# Patient Record
Sex: Male | Born: 1935 | Race: White | Hispanic: No | Smoking: Former smoker
Health system: Southern US, Community
[De-identification: ages and names within clinical notes are randomized; demographics above are authoritative.]

## PROBLEM LIST (undated history)

## (undated) DIAGNOSIS — R251 Tremor, unspecified: Secondary | ICD-10-CM

## (undated) DIAGNOSIS — D539 Nutritional anemia, unspecified: Secondary | ICD-10-CM

## (undated) DIAGNOSIS — E119 Type 2 diabetes mellitus without complications: Secondary | ICD-10-CM

## (undated) DIAGNOSIS — Z7409 Other reduced mobility: Secondary | ICD-10-CM

## (undated) DIAGNOSIS — Z8711 Personal history of peptic ulcer disease: Secondary | ICD-10-CM

## (undated) DIAGNOSIS — I739 Peripheral vascular disease, unspecified: Secondary | ICD-10-CM

## (undated) DIAGNOSIS — R29898 Other symptoms and signs involving the musculoskeletal system: Secondary | ICD-10-CM

## (undated) DIAGNOSIS — I2699 Other pulmonary embolism without acute cor pulmonale: Secondary | ICD-10-CM

## (undated) DIAGNOSIS — I350 Nonrheumatic aortic (valve) stenosis: Secondary | ICD-10-CM

## (undated) DIAGNOSIS — G20 Parkinson's disease: Secondary | ICD-10-CM

## (undated) DIAGNOSIS — F0280 Dementia in other diseases classified elsewhere without behavioral disturbance: Secondary | ICD-10-CM

## (undated) DIAGNOSIS — J189 Pneumonia, unspecified organism: Secondary | ICD-10-CM

## (undated) DIAGNOSIS — M1990 Unspecified osteoarthritis, unspecified site: Secondary | ICD-10-CM

## (undated) DIAGNOSIS — F0390 Unspecified dementia without behavioral disturbance: Secondary | ICD-10-CM

## (undated) DIAGNOSIS — Z6841 Body Mass Index (BMI) 40.0 and over, adult: Secondary | ICD-10-CM

## (undated) DIAGNOSIS — D72829 Elevated white blood cell count, unspecified: Secondary | ICD-10-CM

## (undated) DIAGNOSIS — R011 Cardiac murmur, unspecified: Secondary | ICD-10-CM

## (undated) DIAGNOSIS — N281 Cyst of kidney, acquired: Secondary | ICD-10-CM

## (undated) DIAGNOSIS — I82409 Acute embolism and thrombosis of unspecified deep veins of unspecified lower extremity: Secondary | ICD-10-CM

## (undated) DIAGNOSIS — I482 Chronic atrial fibrillation, unspecified: Secondary | ICD-10-CM

## (undated) DIAGNOSIS — E039 Hypothyroidism, unspecified: Secondary | ICD-10-CM

## (undated) DIAGNOSIS — I5032 Chronic diastolic (congestive) heart failure: Secondary | ICD-10-CM

## (undated) DIAGNOSIS — E7800 Pure hypercholesterolemia, unspecified: Secondary | ICD-10-CM

## (undated) DIAGNOSIS — G629 Polyneuropathy, unspecified: Secondary | ICD-10-CM

## (undated) DIAGNOSIS — I2510 Atherosclerotic heart disease of native coronary artery without angina pectoris: Secondary | ICD-10-CM

## (undated) DIAGNOSIS — G20A1 Parkinson's disease without dyskinesia, without mention of fluctuations: Secondary | ICD-10-CM

## (undated) DIAGNOSIS — R32 Unspecified urinary incontinence: Secondary | ICD-10-CM

## (undated) DIAGNOSIS — J42 Unspecified chronic bronchitis: Secondary | ICD-10-CM

## (undated) DIAGNOSIS — I209 Angina pectoris, unspecified: Secondary | ICD-10-CM

## (undated) DIAGNOSIS — I10 Essential (primary) hypertension: Secondary | ICD-10-CM

## (undated) DIAGNOSIS — Z8719 Personal history of other diseases of the digestive system: Secondary | ICD-10-CM

## (undated) HISTORY — DX: Unspecified dementia, unspecified severity, without behavioral disturbance, psychotic disturbance, mood disturbance, and anxiety: F03.90

## (undated) HISTORY — DX: Chronic atrial fibrillation, unspecified: I48.20

## (undated) HISTORY — PX: VENA CAVA FILTER PLACEMENT: SUR1032

## (undated) HISTORY — DX: Dementia in other diseases classified elsewhere without behavioral disturbance: F02.80

## (undated) HISTORY — DX: Hypothyroidism, unspecified: E03.9

## (undated) HISTORY — DX: Morbid (severe) obesity due to excess calories: E66.01

## (undated) HISTORY — DX: Polyneuropathy, unspecified: G62.9

## (undated) HISTORY — DX: Parkinson's disease: G20

## (undated) HISTORY — PX: PROSTATE ABLATION: SHX6042

## (undated) HISTORY — DX: Chronic diastolic (congestive) heart failure: I50.32

## (undated) HISTORY — DX: Pure hypercholesterolemia, unspecified: E78.00

## (undated) HISTORY — DX: Cyst of kidney, acquired: N28.1

## (undated) HISTORY — PX: MOLE REMOVAL: SHX2046

## (undated) HISTORY — DX: Elevated white blood cell count, unspecified: D72.829

## (undated) HISTORY — DX: Atherosclerotic heart disease of native coronary artery without angina pectoris: I25.10

## (undated) HISTORY — DX: Other symptoms and signs involving the musculoskeletal system: R29.898

## (undated) HISTORY — DX: Body Mass Index (BMI) 40.0 and over, adult: Z684

## (undated) HISTORY — DX: Essential (primary) hypertension: I10

## (undated) HISTORY — DX: Other pulmonary embolism without acute cor pulmonale: I26.99

## (undated) HISTORY — DX: Unspecified urinary incontinence: R32

## (undated) HISTORY — DX: Nutritional anemia, unspecified: D53.9

## (undated) HISTORY — DX: Other reduced mobility: Z74.09

---

## 2003-03-14 ENCOUNTER — Ambulatory Visit (HOSPITAL_COMMUNITY): Admission: RE | Admit: 2003-03-14 | Discharge: 2003-03-14 | Payer: Self-pay | Admitting: Urology

## 2003-03-14 ENCOUNTER — Encounter (INDEPENDENT_AMBULATORY_CARE_PROVIDER_SITE_OTHER): Payer: Self-pay | Admitting: Specialist

## 2003-03-14 ENCOUNTER — Encounter: Payer: Self-pay | Admitting: Urology

## 2005-03-31 ENCOUNTER — Ambulatory Visit: Payer: Self-pay | Admitting: Cardiology

## 2005-04-15 ENCOUNTER — Ambulatory Visit: Payer: Self-pay

## 2005-04-15 ENCOUNTER — Ambulatory Visit: Payer: Self-pay | Admitting: Cardiology

## 2005-04-21 ENCOUNTER — Ambulatory Visit: Payer: Self-pay

## 2005-05-05 ENCOUNTER — Ambulatory Visit: Payer: Self-pay | Admitting: Cardiology

## 2005-05-11 ENCOUNTER — Ambulatory Visit: Payer: Self-pay | Admitting: Internal Medicine

## 2005-05-11 ENCOUNTER — Ambulatory Visit: Payer: Self-pay | Admitting: Cardiology

## 2005-05-11 ENCOUNTER — Inpatient Hospital Stay (HOSPITAL_BASED_OUTPATIENT_CLINIC_OR_DEPARTMENT_OTHER): Admission: RE | Admit: 2005-05-11 | Discharge: 2005-05-11 | Payer: Self-pay | Admitting: Cardiovascular Disease

## 2005-05-18 ENCOUNTER — Ambulatory Visit: Payer: Self-pay | Admitting: Internal Medicine

## 2005-08-18 ENCOUNTER — Ambulatory Visit: Payer: Self-pay | Admitting: Cardiology

## 2005-08-27 ENCOUNTER — Ambulatory Visit: Payer: Self-pay

## 2005-08-27 ENCOUNTER — Ambulatory Visit: Payer: Self-pay | Admitting: Cardiology

## 2007-02-08 ENCOUNTER — Ambulatory Visit: Payer: Self-pay | Admitting: Cardiology

## 2007-03-03 ENCOUNTER — Ambulatory Visit: Payer: Self-pay | Admitting: Cardiology

## 2007-03-03 ENCOUNTER — Ambulatory Visit: Payer: Self-pay

## 2007-06-23 ENCOUNTER — Ambulatory Visit: Payer: Self-pay | Admitting: Cardiology

## 2007-08-23 ENCOUNTER — Ambulatory Visit: Payer: Self-pay | Admitting: Cardiology

## 2007-08-23 ENCOUNTER — Encounter: Payer: Self-pay | Admitting: Internal Medicine

## 2007-08-23 ENCOUNTER — Ambulatory Visit: Payer: Self-pay | Admitting: Vascular Surgery

## 2007-08-23 ENCOUNTER — Inpatient Hospital Stay (HOSPITAL_COMMUNITY): Admission: EM | Admit: 2007-08-23 | Discharge: 2007-09-05 | Payer: Self-pay | Admitting: Emergency Medicine

## 2007-08-23 ENCOUNTER — Ambulatory Visit: Payer: Self-pay | Admitting: Internal Medicine

## 2007-08-26 ENCOUNTER — Encounter: Payer: Self-pay | Admitting: Internal Medicine

## 2007-08-30 ENCOUNTER — Encounter: Payer: Self-pay | Admitting: Internal Medicine

## 2007-09-01 ENCOUNTER — Ambulatory Visit: Payer: Self-pay | Admitting: Internal Medicine

## 2008-04-30 ENCOUNTER — Ambulatory Visit: Payer: Self-pay | Admitting: Cardiology

## 2008-09-21 ENCOUNTER — Encounter: Payer: Self-pay | Admitting: Cardiology

## 2008-09-21 ENCOUNTER — Ambulatory Visit: Payer: Self-pay | Admitting: Cardiology

## 2008-09-21 ENCOUNTER — Ambulatory Visit: Payer: Self-pay

## 2008-12-20 ENCOUNTER — Ambulatory Visit: Payer: Self-pay | Admitting: Cardiology

## 2008-12-20 DIAGNOSIS — I350 Nonrheumatic aortic (valve) stenosis: Secondary | ICD-10-CM

## 2008-12-20 DIAGNOSIS — E7800 Pure hypercholesterolemia, unspecified: Secondary | ICD-10-CM

## 2008-12-20 DIAGNOSIS — Z86711 Personal history of pulmonary embolism: Secondary | ICD-10-CM | POA: Insufficient documentation

## 2008-12-20 DIAGNOSIS — I10 Essential (primary) hypertension: Secondary | ICD-10-CM

## 2008-12-20 DIAGNOSIS — I4891 Unspecified atrial fibrillation: Secondary | ICD-10-CM

## 2008-12-20 DIAGNOSIS — I82409 Acute embolism and thrombosis of unspecified deep veins of unspecified lower extremity: Secondary | ICD-10-CM | POA: Insufficient documentation

## 2008-12-20 HISTORY — DX: Pure hypercholesterolemia, unspecified: E78.00

## 2009-01-05 IMAGING — CT CT PELVIS W/ CM
2 of 5 series · 16 of 46 positions shown, 18 images · IV contrast (APPLIED)
Comparison: None.

ABDOMEN CT WITH CONTRAST:

CLINICAL DATA: Pulmonary embolism. Chest pain. Pain all over. Question abscess.
TECHNIQUE: Multidetector CT imaging of the abdomen and pelvis was performed
following the standard protocol during bolus administration of intravenous
contrast.

Contrast:  100 cc Omnipaque 300

[Series 2: abd/pelv with 5.0 b31f st · axial · 0.88mm/px · z∈[-538,-68]mm · 13 of 108 slices shown, 15 images]
[im 7/108  soft-tissue]
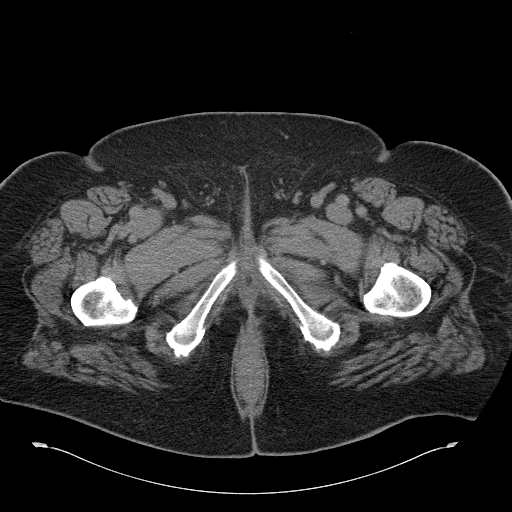
[im 7/108  bone]
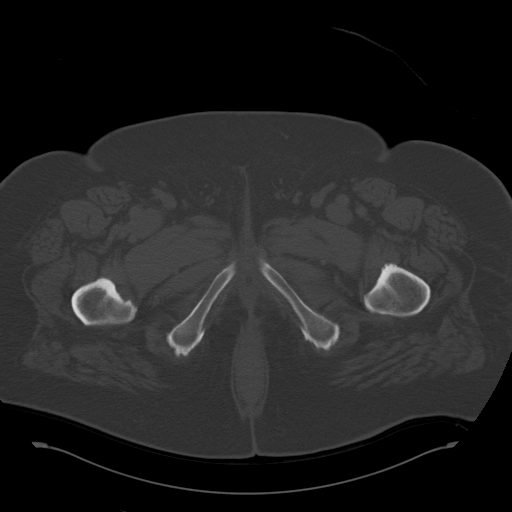
[im 13/108  soft-tissue]
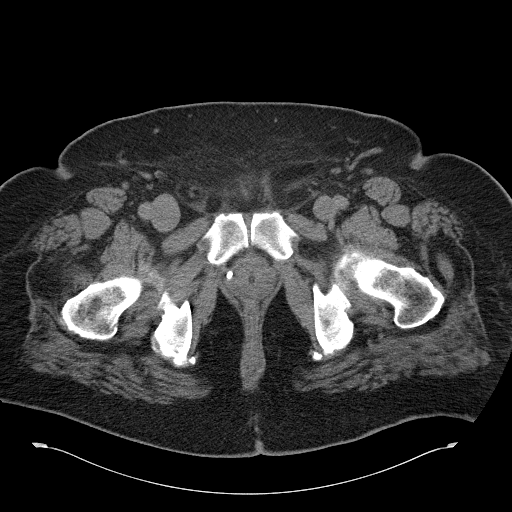
[im 26/108  soft-tissue]
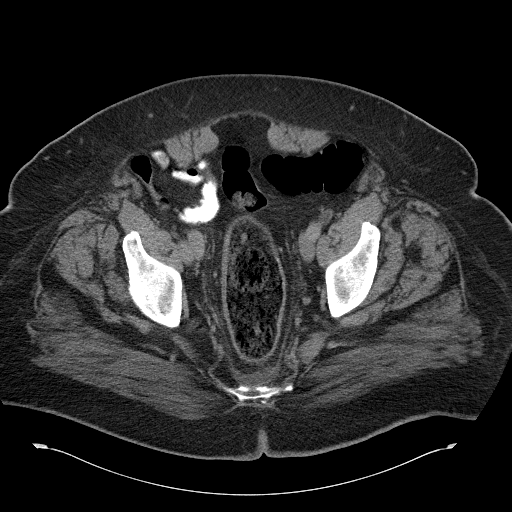
[im 32/108  soft-tissue]
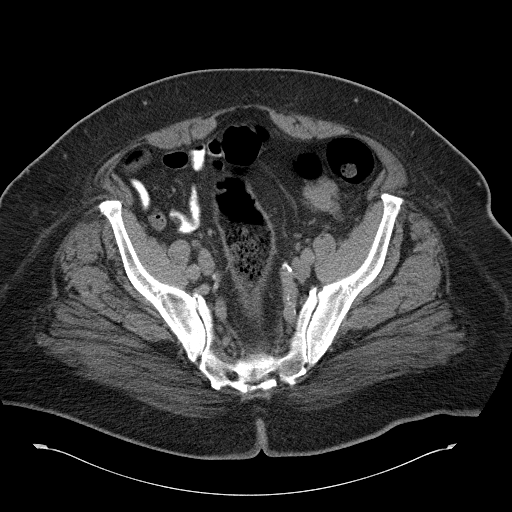
[im 38/108  soft-tissue]
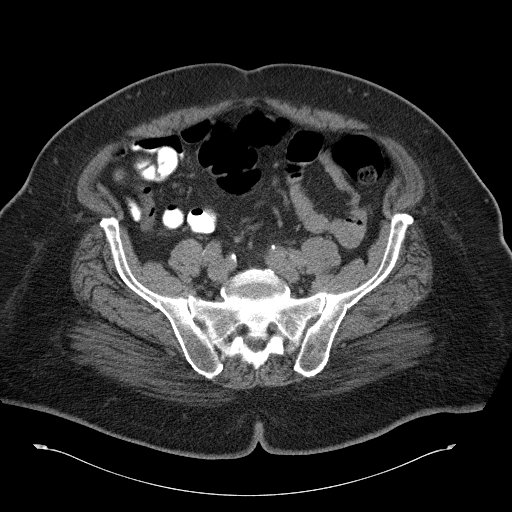
[im 45/108  soft-tissue]
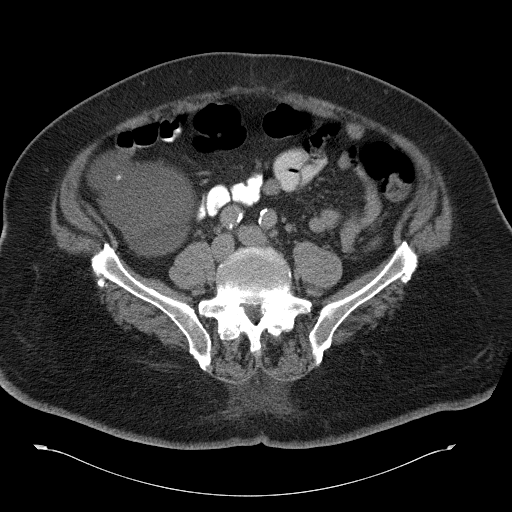
[im 57/108  soft-tissue]
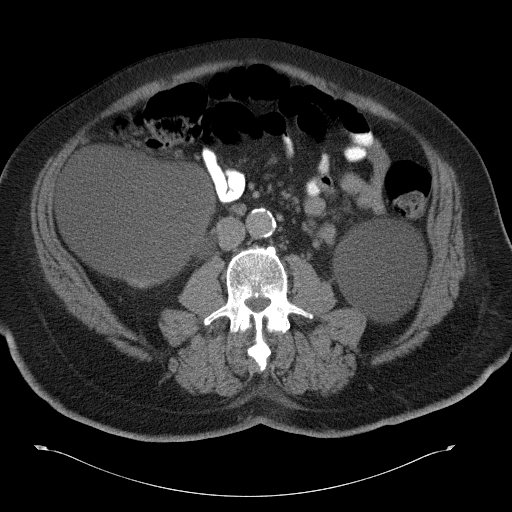
[im 63/108  soft-tissue]
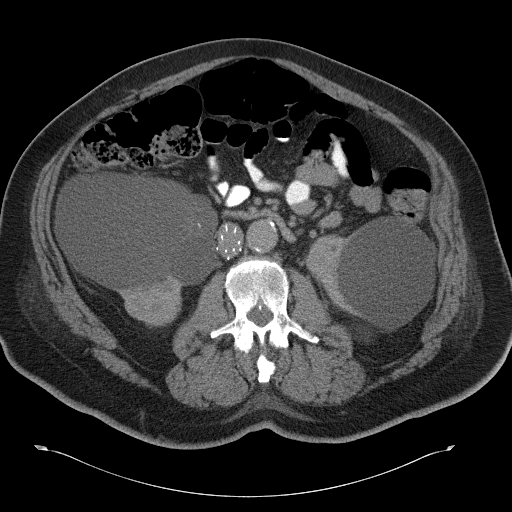
[im 70/108  soft-tissue]
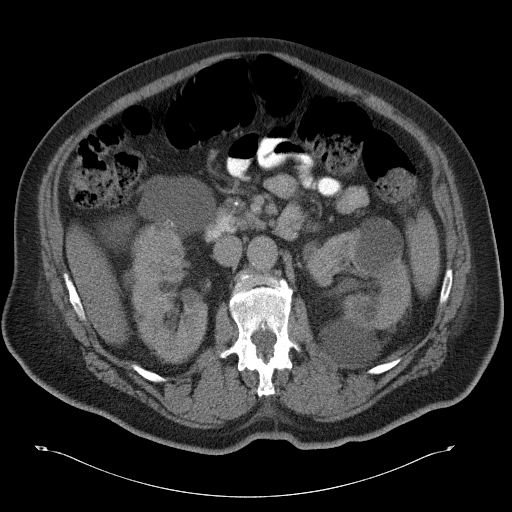
[im 70/108  bone]
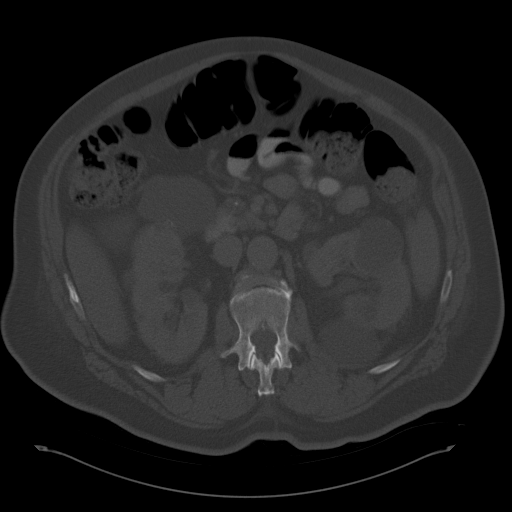
[im 76/108  soft-tissue]
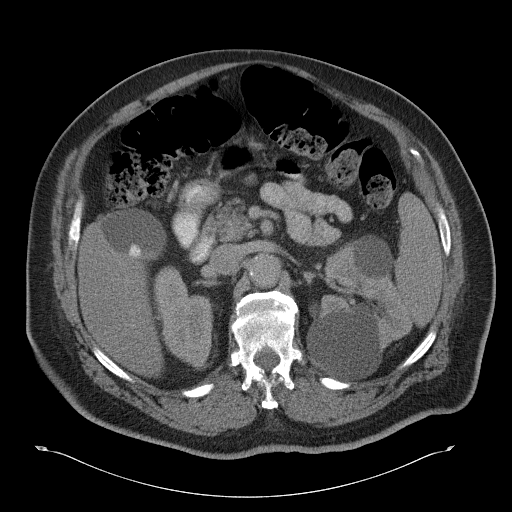
[im 82/108  soft-tissue]
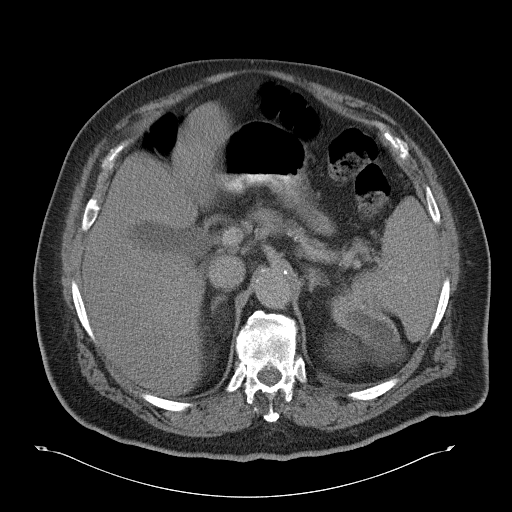
[im 95/108  soft-tissue]
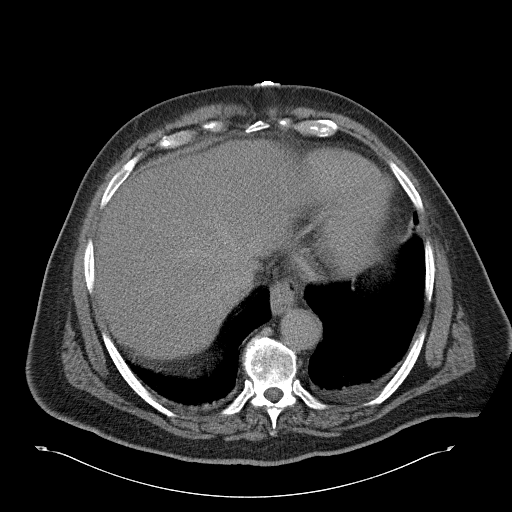
[im 101/108  soft-tissue]
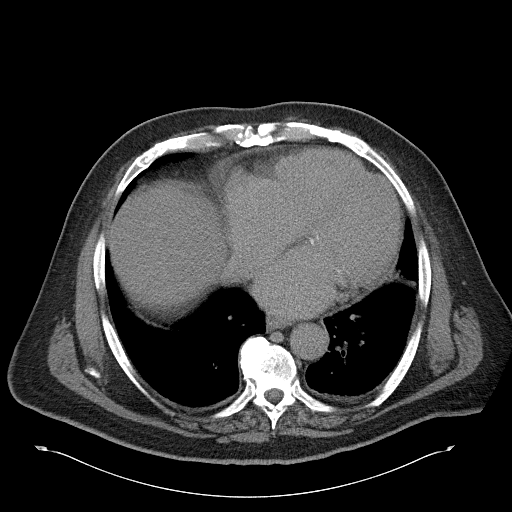

[Series 5: abd/pelv with 2.0 spo cor st · coronal · 1.04mm/px · 3 of 176 slices shown]
[im 59/176  soft-tissue]
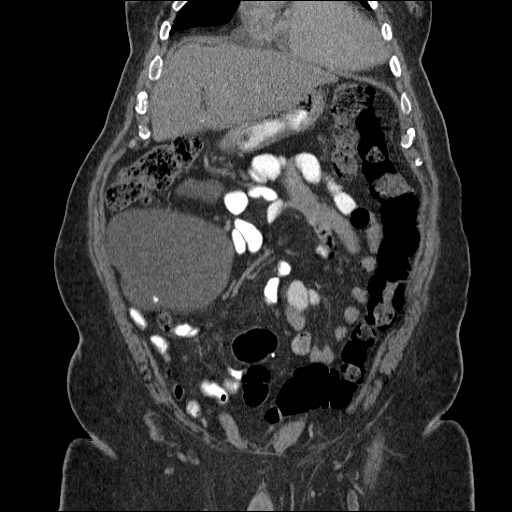
[im 78/176  soft-tissue]
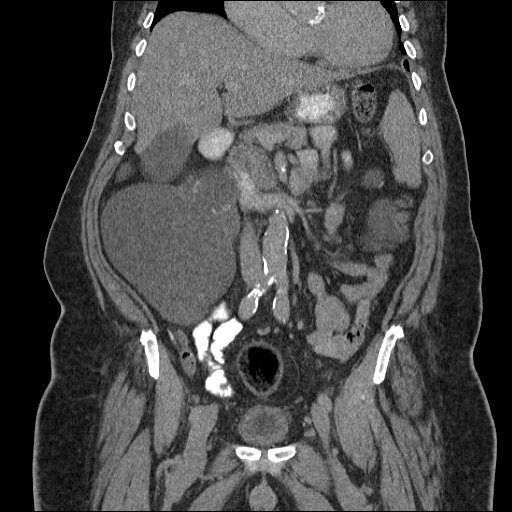
[im 98/176  soft-tissue]
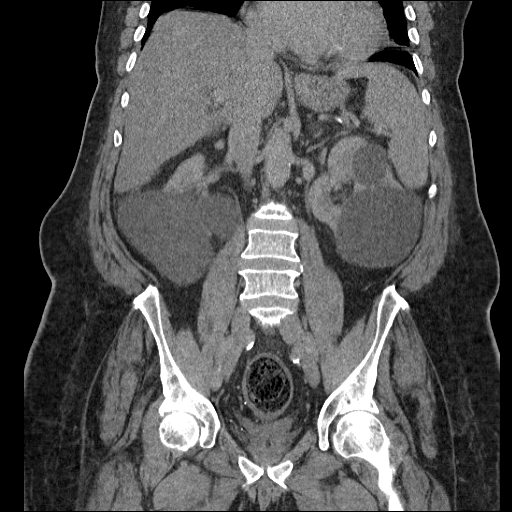

[16 of 46 positions shown; findings below may reference images not displayed]

FINDINGS: Compressive atelectasis is seen bilaterally in the lung bases with a
tiny left pleural effusion. Heart is enlarged, seen previously.

The liver measures almost 20 cm in craniocaudal length. No focal abnormality is
seen in the liver or spleen. There is a small hiatal hernia. Stomach, duodenum,
pancreas, and adrenal glands are unremarkable. Numerous stones are seen in the
gallbladder lumen.

The patient has large renal cysts bilaterally, measuring up to 10 cm in the left
kidney. 4.8 cm cystic lesion in the left kidney has some areas of apparent mural
nodularity. A 15 cm cystic lesion is seen in the right kidney. There is an
adjacent 7.5 cm right renal cystic lesion which has some peripheral wall
calcification.

No abdominal aortic aneurysm. Inferior vena cava filter is seen in the
infrarenal IVC. Lymph nodes in the retroperitoneal space are borderline enlarged
by CT criteria. There is also some borderline adenopathy in the hepatoduodenal
ligament. Abdominal bowel loops are nondilated.
IMPRESSION: Cholelithiasis.

Bilateral large renal cysts, some of which are complicated is could be
classified as Bozniak IIF, possibly Bozniak 3 in the left kidney. If the patient
has previous CT scans of the abdomen, correlation to those studies would be
helpful to determine the chronicity of these larger cystic lesions. Consider
followup to ensure stability.

Hepatomegaly.

IVC filter.

Borderline hepatoduodenal ligament and retroperitoneal lymphadenopathy.

PELVIS CT WITH CONTRAST:
FINDINGS: No free intraperitoneal fluid. A Foley catheter decompresses the
urinary bladder. Prominent stool is seen in the rectum and there is mild
perirectal edema/inflammation, of indeterminate etiology. The terminal ileum is
normal. The appendix is normal.

Bone windows show degenerative changes in the lumbar facets. Grade 1
anterolisthesis of L4 on 5 is evident.
IMPRESSION: Prominent amount of stool in the rectum with mild rectal wall thickening and
perirectal edema/inflammation.

## 2009-03-07 ENCOUNTER — Ambulatory Visit: Payer: Self-pay | Admitting: Cardiology

## 2009-09-12 ENCOUNTER — Ambulatory Visit: Payer: Self-pay | Admitting: Cardiology

## 2009-11-21 ENCOUNTER — Ambulatory Visit: Payer: Self-pay

## 2009-11-21 ENCOUNTER — Ambulatory Visit: Payer: Self-pay | Admitting: Cardiovascular Disease

## 2009-11-21 ENCOUNTER — Ambulatory Visit (HOSPITAL_COMMUNITY): Admission: RE | Admit: 2009-11-21 | Discharge: 2009-11-21 | Payer: Self-pay | Admitting: Cardiology

## 2009-11-21 ENCOUNTER — Encounter: Payer: Self-pay | Admitting: Cardiology

## 2010-01-01 ENCOUNTER — Ambulatory Visit: Payer: Self-pay | Admitting: Cardiology

## 2010-07-04 ENCOUNTER — Ambulatory Visit (HOSPITAL_COMMUNITY)
Admission: RE | Admit: 2010-07-04 | Discharge: 2010-07-04 | Payer: Self-pay | Source: Home / Self Care | Admitting: Cardiology

## 2010-07-04 ENCOUNTER — Ambulatory Visit: Payer: Self-pay | Admitting: Cardiology

## 2010-07-04 ENCOUNTER — Encounter: Payer: Self-pay | Admitting: Cardiology

## 2010-07-04 ENCOUNTER — Ambulatory Visit: Payer: Self-pay

## 2010-09-02 NOTE — Assessment & Plan Note (Signed)
Summary: f89m   Visit Type:  6 months follow up Primary Provider:  Samuel Germany  CC:  No complains.  History of Present Illness: Doing well overall.  Weight has been stable.  No chest pain, rarely LLQ.  No increase in edema.  Would like to lose more weight.    Current Medications (verified): 1)  Coumadin 5 Mg Tabs (Warfarin Sodium) .... Take As Directed 2)  Synthroid 200 Mcg Tabs (Levothyroxine Sodium) .... Take 1 Tablet By Mouth Once A Day 3)  Captopril 25 Mg Tabs (Captopril) .... Take 1 Tablet By Mouth Two Times A Day 4)  Lasix 80 Mg Tabs (Furosemide) .... Take 1 Tablet By Mouth Once A Day 5)  Toprol Xl 50 Mg Xr24h-Tab (Metoprolol Succinate) .... Take 1 Tablet By Mouth Once A Day 6)  Allopurinol 300 Mg Tabs (Allopurinol) .... Take 1 Tablet By Mouth Once A Day 7)  Lanoxin 0.125 Mg Tabs (Digoxin) .... Take 1 Tablet By Mouth Once A Day 8)  Lorazepam 1 Mg Tabs (Lorazepam) .... As Needed 9)  Tramadol Hcl 50 Mg Tabs (Tramadol Hcl) .... As Needed 10)  Sucralfate 1 Gm Tabs (Sucralfate) .... Take 1 Tablet By Mouth Two Times A Day 11)  Arthritis Pain Relief 650 Mg Cr-Tabs (Acetaminophen) .... Take 2 Tabs Am and 1 Tab Pm 12)  Osteo Bi-Flex Adv Double St  Tabs (Misc Natural Products) .... Take 2 Tabs Am and 1 Tab Pm 13)  Invega 3 Mg Xr24h-Tab (Paliperidone) .... Take 1 Tablet By Mouth Once A Day 14)  Vitamin B12 Shot .... Once A Month 15)  Omeprazole 20 Mg Tbec (Omeprazole) .... Take 1 Tablet By Mouth Once A Day 16)  Crestor 5 Mg Tabs (Rosuvastatin Calcium) .... Take One Tablet By Mouth Daily.  Allergies: 1)  ! Procardia  Vital Signs:  Patient profile:   77 year old male Height:      72 inches Weight:      281.25 pounds BMI:     38.28 Pulse rate:   76 / minute Pulse rhythm:   irregular Resp:     18 per minute BP sitting:   135 / 85  (left arm) Cuff size:   large  Vitals Entered By: Vikki Ports (September 12, 2009 3:26 PM)  Physical Exam  General:  Well developed, well nourished, in no  acute distress. Head:  normocephalic and atraumatic Eyes:  PERRLA/EOM intact; conjunctiva and lids normal. Lungs:  Clear bilaterally to auscultation and percussion. Heart:  Normal S1 and S2.  Split S2.  SEM 3/6 consistent with AS.   Abdomen:  Bowel sounds positive; abdomen soft and non-tender without masses, organomegaly, or hernias noted. No hepatosplenomegaly. Msk:  Back normal, normal gait. Muscle strength and tone normal. Extremities:  one to two plus edema.   Neurologic:  Alert and oriented x 3.   EKG  Procedure date:  09/12/2009  Findings:      atrial fibrillation.  Controlled ventricular response.  Impression & Recommendations:  Problem # 1:  AORTIC STENOSIS/ INSUFFICIENCY, NON-RHEUMATIC (ICD-424.1)  Has murmur.  Will need repeat echocardiogram.  Orders: EKG w/ Interpretation (93000) Echocardiogram (Echo)  Problem # 2:  HYPERTENSION, BENIGN (ICD-401.1)  Controlled at present. His updated medication list for this problem includes:    Captopril 25 Mg Tabs (Captopril) .Marland Kitchen... Take 1 tablet by mouth two times a day    Lasix 80 Mg Tabs (Furosemide) .Marland Kitchen... Take 1 tablet by mouth once a day    Toprol Xl 50 Mg Xr24h-tab (  Metoprolol succinate) .Marland Kitchen... Take 1 tablet by mouth once a day  Orders: EKG w/ Interpretation (93000) Echocardiogram (Echo)  Problem # 3:  HYPERCHOLESTEROLEMIA  IIA (ICD-272.0) Dr. Martha Clan office is managing. His updated medication list for this problem includes:    Crestor 5 Mg Tabs (Rosuvastatin calcium) .Marland Kitchen... Take one tablet by mouth daily.  Problem # 4:  DEEP VENOUS THROMBOPHLEBITIS, CHRONIC (ICD-453.40)  Lifelong coumadin.  Orders: EKG w/ Interpretation (93000) Echocardiogram (Echo)  Problem # 5:  PULMONARY EMBOLISM (ICD-415.19)  No recurrent symptoms.  Last occurred with low INR. His updated medication list for this problem includes:    Coumadin 5 Mg Tabs (Warfarin sodium) .Marland Kitchen... Take as directed  Orders: EKG w/ Interpretation  (93000) Echocardiogram (Echo)  Patient Instructions: 1)  Your physician wants you to follow-up in:  6 MONTHS. You will receive a reminder letter in the mail two months in advance. If you don't receive a letter, please call our office to schedule the follow-up appointment. 2)  Your physician has requested that you have an echocardiogram.  Echocardiography is a painless test that uses sound waves to create images of your heart. It provides your doctor with information about the size and shape of your heart and how well your heart's chambers and valves are working.  This procedure takes approximately one hour. There are no restrictions for this procedure. 3)  Your physician recommends that you continue on your current medications as directed. Please refer to the Current Medication list given to you today.

## 2010-09-02 NOTE — Assessment & Plan Note (Signed)
Summary: f/u echo   Visit Type:  Follow-up Primary Provider:  Samuel Germany  CC:  Follow up echo.  History of Present Illness: Is able to go about 400-500 feet, but mainly because of leg pain.  Denies shortness of breath , and denies chest pain.  His knees, and legs bother him mainly.    Current Medications (verified): 1)  Coumadin 5 Mg Tabs (Warfarin Sodium) .... Take As Directed 2)  Synthroid 200 Mcg Tabs (Levothyroxine Sodium) .... Take 1 Tablet By Mouth Once A Day 3)  Captopril 25 Mg Tabs (Captopril) .... Take 1 Tablet By Mouth Two Times A Day 4)  Lasix 80 Mg Tabs (Furosemide) .... Take 1 Tablet By Mouth Once A Day 5)  Toprol Xl 50 Mg Xr24h-Tab (Metoprolol Succinate) .... Take 1 Tablet By Mouth Once A Day 6)  Allopurinol 300 Mg Tabs (Allopurinol) .... Take 1 Tablet By Mouth Once A Day 7)  Lanoxin 0.125 Mg Tabs (Digoxin) .... Take 1 Tablet By Mouth Once A Day 8)  Lorazepam 1 Mg Tabs (Lorazepam) .... As Needed 9)  Tramadol Hcl 50 Mg Tabs (Tramadol Hcl) .... As Needed 10)  Sucralfate 1 Gm Tabs (Sucralfate) .... Take 1 Tablet By Mouth Two Times A Day 11)  Arthritis Pain Relief 650 Mg Cr-Tabs (Acetaminophen) .... Take 2 Tabs Am and 1 Tab Pm 12)  Osteo Bi-Flex Adv Double St  Tabs (Misc Natural Products) .... Take 2 Tabs Am and 1 Tab Pm 13)  Invega 3 Mg Xr24h-Tab (Paliperidone) .... Take 1 Tablet By Mouth Once A Day 14)  Vitamin B12 Shot .... Once A Month 15)  Omeprazole 20 Mg Tbec (Omeprazole) .... Take 1 Tablet By Mouth Once A Day 16)  Fish Oil 1000 Mg Caps (Omega-3 Fatty Acids) .... Take 1 Capsule By Mouth Two Times A Day  Allergies: 1)  ! Procardia  Past History:  Past Medical History: Last updated: 09/30/2008  1. Bilateral pulmonary embolus with extensive bilateral deep venous       thrombosis.  Status post IVC filter placement August 24, 2007.   2. Fever/leukocytosis of unclear source, question secondary to       pulmonary embolism/deep venous thrombosis status post 7 days   empiric treatment with IV vancomycin and Zosyn.   3. Diabetes type 2.   4. Hypothyroid.   5. Hypertension.   6. History of coronary artery disease.   7. Chronic atrial fibrillation.  INR therapeutic on Coumadin.   8. Dementia/hallucinations status post psychiatric evaluation during       this admission.   9. Kidney cyst noted on CT.  Plan for outpatient follow up as needed.   10.Severe debilitation.  Plan for transfer to skilled nursing facility       at time of discharge.   1. AFib on chronic Coumadin for over 20 years.   2. Pulmonary embolism over 10 years ago.   3. Aortic stenosis followed by Dr. [name] in cardiology.   4. Coronary artery disease, cath 2006.  No stents.  Ejection fraction       normal.   5. Hypothyroidism.   6. Dementia with recent hospitalization.   7. Hypertension.   Past Surgical History: Last updated: 09/30/2008 Mole Removal  Family History: Last updated: 09/30/2008 CVD and coronary disease in multiple family members [place] with gallbladder surgery FAMILY HISTORY:  Father had MI at the age of 81.  Mother with CHF.   Social History: Last updated: 09/30/2008 Married     SOCIAL HISTORY:  He is married and lives with his wife.  No smoking in   over 60 years.  Does not drink or abuse drugs.   Vital Signs:  Patient profile:   77 year old male Height:      72 inches Weight:      275 pounds BMI:     37.43 Pulse rate:   66 / minute Pulse rhythm:   regular Resp:     18 per minute BP sitting:   128 / 72  (left arm) Cuff size:   large  Vitals Entered By: Vikki Ports (January 01, 2010 1:08 PM)  Physical Exam  General:  Well developed, well nourished, in no acute distress. Head:  normocephalic and atraumatic Eyes:  PERRLA/EOM intact; conjunctiva and lids normal. Lungs:  Clear bilaterally to auscultation and percussion. Heart:  PMI non displaced.  Normal S1 fixed S2.  SEM 3/6. Abdomen:  Bowel sounds positive; abdomen soft and non-tender without  masses, organomegaly, or hernias noted. No hepatosplenomegaly. Msk:  Back normal, normal gait. Muscle strength and tone normal. Extremities:  No clubbing or cyanosis. Neurologic:  Alert and oriented x 3.   Echocardiogram  Procedure date:  11/21/2009  Findings:      Study Conclusions            - Left ventricle: The cavity size was normal. Wall thickness was       increased in a pattern of severe LVH. Systolic function was       normal. The estimated ejection fraction was in the range of 55% to       60%.     - Aortic valve: Severely calcified annulus. There was moderate       stenosis. Valve area: 0.91cm 2(VTI). Valve area: 0.91cm 2 (Vmax).     - Mitral valve: Calcified annulus. Mildly thickened leaflets . Mild       regurgitation.     - Left atrium: The atrium was severely dilated.     - Right atrium: The atrium was mildly dilated.     - Atrial septum: No defect or patent foramen ovale was identified.  EKG  Procedure date:  01/01/2010  Findings:      atrial fibrillation.  Controlled vent responose.   Impression & Recommendations:  Problem # 1:  AORTIC STENOSIS/ INSUFFICIENCY, NON-RHEUMATIC (ICD-424.1)  moderately severe and progressive. Currently Ok.  Continue to follow.   His updated medication list for this problem includes:    Captopril 25 Mg Tabs (Captopril) .Marland Kitchen... Take 1 tablet by mouth two times a day    Lasix 80 Mg Tabs (Furosemide) .Marland Kitchen... Take 1 tablet by mouth once a day    Toprol Xl 50 Mg Xr24h-tab (Metoprolol succinate) .Marland Kitchen... Take 1 tablet by mouth once a day    Lanoxin 0.125 Mg Tabs (Digoxin) .Marland Kitchen... Take 1 tablet by mouth once a day  Orders: Echocardiogram (Echo)  Problem # 2:  ATRIAL FIBRILLATION (ICD-427.31)  remains iin afib, with controlled vent response.  His updated medication list for this problem includes:    Coumadin 5 Mg Tabs (Warfarin sodium) .Marland Kitchen... Take as directed    Toprol Xl 50 Mg Xr24h-tab (Metoprolol succinate) .Marland Kitchen... Take 1 tablet by mouth  once a day    Lanoxin 0.125 Mg Tabs (Digoxin) .Marland Kitchen... Take 1 tablet by mouth once a day  Orders: EKG w/ Interpretation (93000)  Problem # 3:  PULMONARY EMBOLISM (ICD-415.19) needs to remain on meds.  His updated medication list for this problem includes:  Coumadin 5 Mg Tabs (Warfarin sodium) .Marland Kitchen... Take as directed  Problem # 4:  HYPERCHOLESTEROLEMIA  IIA (ICD-272.0) Heart started fluttering on Crestor so he stoppoed.  Encourage a retrial.  The following medications were removed from the medication list:    Crestor 5 Mg Tabs (Rosuvastatin calcium) .Marland Kitchen... Take one tablet by mouth daily.  Patient Instructions: 1)  Your physician recommends that you schedule a follow-up appointment in: 6 MONTHS WITH DR [name] AND ECHO SAME DAY 2)  Your physician recommends that you continue on your current medications as directed. Please refer to the Current Medication list given to you today. 3)  Your physician has requested that you have an echocardiogram.  Echocardiography is a painless test that uses sound waves to create images of your heart. It provides your doctor with information about the size and shape of your heart and how well your heart's chambers and valves are working.  This procedure takes approximately one hour. There are no restrictions for this procedure.SEE DR [name] SAME DAY

## 2010-09-04 NOTE — Assessment & Plan Note (Signed)
Summary: f63m/need echo sameday/lwb   Visit Type:  6 months follow up Primary Provider:  Samuel Germany  CC:  follow up echo done today.  History of Present Illness: Overall he is doing pretty well.  Denies any ongoing chest pain.  He has lost alot of weight, and is generally better overall.  However, he has gained a few pounds back.    Problems Prior to Update: 1)  Hypertension, Benign  (ICD-401.1) 2)  Deep Venous Thrombophlebitis, Chronic  (ICD-453.40) 3)  Atrial Fibrillation  (ICD-427.31) 4)  Pulmonary Embolism  (ICD-415.19) 5)  Hypercholesterolemia Iia  (ICD-272.0) 6)  Aortic Stenosis/ Insufficiency, Non-rheumatic  (ICD-424.1)  Current Medications (verified): 1)  Coumadin 5 Mg Tabs (Warfarin Sodium) .... Take As Directed 2)  Synthroid 175 Mcg Tabs (Levothyroxine Sodium) .... Take 1 Tablet By Mouth Once A Day 3)  Captopril 25 Mg Tabs (Captopril) .... Take 1 Tablet By Mouth Two Times A Day 4)  Lasix 80 Mg Tabs (Furosemide) .... Take 1 Tablet By Mouth Once A Day 5)  Toprol Xl 50 Mg Xr24h-Tab (Metoprolol Succinate) .... Take 1 Tablet By Mouth Once A Day 6)  Allopurinol 300 Mg Tabs (Allopurinol) .... Take 1 Tablet By Mouth Once A Day 7)  Lorazepam 1 Mg Tabs (Lorazepam) .... As Needed 8)  Sucralfate 1 Gm Tabs (Sucralfate) .... Take 1 Tablet By Mouth Two Times A Day 9)  Arthritis Pain Relief 650 Mg Cr-Tabs (Acetaminophen) .... Take 2 Tabs Am and 1 Tab Pm 10)  Osteo Bi-Flex Adv Double St  Tabs (Misc Natural Products) .... Take 2 Tabs Daily 11)  Invega 3 Mg Xr24h-Tab (Paliperidone) .... Take 1 Tablet By Mouth Once A Day 12)  Vitamin B12 Shot .... Once A Month 13)  Omeprazole 20 Mg Tbec (Omeprazole) .... Take 1 Tablet By Mouth Once A Day 14)  Fish Oil 1000 Mg Caps (Omega-3 Fatty Acids) .... Take 1 Capsule By Mouth Two Times A Day 15)  Pravastatin Sodium 10 Mg Tabs (Pravastatin Sodium) .... Take One Tablet By Mouth Daily At Bedtime  Allergies: 1)  ! Procardia  Vital Signs:  Patient profile:    77 year old male Height:      72 inches Weight:      274 pounds BMI:     37.30 Pulse rate:   75 / minute Pulse rhythm:   irregular Resp:     18 per minute BP sitting:   124 / 74  (left arm) Cuff size:   large  Vitals Entered By: Vikki Ports (July 04, 2010 11:37 AM)  Physical Exam  General:  Well developed, well nourished, in no acute distress. Head:  normocephalic and atraumatic Eyes:  PERRLA/EOM intact; conjunctiva and lids normal. Lungs:  Clear bilaterally to auscultation and percussion. Heart:  PMI non displaced. Normal S1 and S2.  3/6 SEM without diastolic blow. Abdomen:  Bowel sounds positive; abdomen soft and non-tender without masses, organomegaly, or hernias noted. No hepatosplenomegaly. Pulses:  pulses normal in all 4 extremities Extremities:  venous stasis changes.   EKG  Procedure date:  07/03/2010  Findings:      atrial fib.  Some LVH.  Controlled ventricular response.    Impression & Recommendations:  Problem # 1:  ATRIAL FIBRILLATION (ICD-427.31) currently controlled with beta blockade.  Rate about adequate.  Remains on long term warfarin anticoagulation The following medications were removed from the medication list:    Lanoxin 0.125 Mg Tabs (Digoxin) .Marland Kitchen... Take 1 tablet by mouth once a day His  updated medication list for this problem includes:    Coumadin 5 Mg Tabs (Warfarin sodium) .Marland Kitchen... Take as directed    Toprol Xl 50 Mg Xr24h-tab (Metoprolol succinate) .Marland Kitchen... Take 1 tablet by mouth once a day  Orders: EKG w/ Interpretation (93000)  Problem # 2:  DEEP VENOUS THROMBOPHLEBITIS, CHRONIC (ICD-453.40) remains on warfarin.  Problem # 3:  PULMONARY EMBOLISM (ICD-415.19) remains on warfarin His updated medication list for this problem includes:    Coumadin 5 Mg Tabs (Warfarin sodium) .Marland Kitchen... Take as directed  Problem # 4:  AORTIC STENOSIS/ INSUFFICIENCY, NON-RHEUMATIC (ICD-424.1) moderately severe to severe.  Echo done today.  will continue to  monitor.  Early followup.  High risk for AVR given age, PE, but doable if needed.  The following medications were removed from the medication list:    Lanoxin 0.125 Mg Tabs (Digoxin) .Marland Kitchen... Take 1 tablet by mouth once a day His updated medication list for this problem includes:    Captopril 25 Mg Tabs (Captopril) .Marland Kitchen... Take 1 tablet by mouth two times a day    Lasix 80 Mg Tabs (Furosemide) .Marland Kitchen... Take 1 tablet by mouth once a day    Toprol Xl 50 Mg Xr24h-tab (Metoprolol succinate) .Marland Kitchen... Take 1 tablet by mouth once a day  Orders: Echocardiogram (Echo)  Patient Instructions: 1)  Your physician recommends that you schedule a follow-up appointment in: 3 MONTHS WITH 2-D ECHO. 2)  Your physician recommends that you continue on your current medications as directed. Please refer to the Current Medication list given to you today. 3)  Your physician has requested that you have an echocardiogram.  Echocardiography is a painless test that uses sound waves to create images of your heart. It provides your doctor with information about the size and shape of your heart and how well your heart's chambers and valves are working.  This procedure takes approximately one hour. There are no restrictions for this procedure. IN 3 MONTHS, SAME DAY OF f/u VISIT.

## 2010-10-09 ENCOUNTER — Encounter: Payer: Self-pay | Admitting: Cardiology

## 2010-10-09 ENCOUNTER — Ambulatory Visit (HOSPITAL_COMMUNITY): Payer: Medicare Other | Attending: Cardiology

## 2010-10-09 ENCOUNTER — Ambulatory Visit (INDEPENDENT_AMBULATORY_CARE_PROVIDER_SITE_OTHER): Payer: Medicare Other | Admitting: Cardiology

## 2010-10-09 DIAGNOSIS — Z86711 Personal history of pulmonary embolism: Secondary | ICD-10-CM | POA: Insufficient documentation

## 2010-10-09 DIAGNOSIS — I10 Essential (primary) hypertension: Secondary | ICD-10-CM

## 2010-10-09 DIAGNOSIS — I4891 Unspecified atrial fibrillation: Secondary | ICD-10-CM | POA: Insufficient documentation

## 2010-10-09 DIAGNOSIS — I359 Nonrheumatic aortic valve disorder, unspecified: Secondary | ICD-10-CM

## 2010-10-09 DIAGNOSIS — E669 Obesity, unspecified: Secondary | ICD-10-CM | POA: Insufficient documentation

## 2010-10-09 DIAGNOSIS — E785 Hyperlipidemia, unspecified: Secondary | ICD-10-CM | POA: Insufficient documentation

## 2010-10-09 DIAGNOSIS — I060 Rheumatic aortic stenosis: Secondary | ICD-10-CM

## 2010-10-30 NOTE — Assessment & Plan Note (Signed)
Summary: 3 month   Primary Provider:  Samuel Graham  CC:  check up.  History of Present Illness: I brought [name] Graham in today for a follow up visit.  He is doing well.  He actually is walking and does not seem to be too limited by his current situation.  We reviewed his echo study in detail, and also discussed with he and his family the various options that would be available to him.  My inclination at the present time is to consider him for very high risk TAVR when his symptoms become worse, and we thought that since he is controlled we would see him back in about three months.  For convenience sake, it seems that the closest location might be [facility] in [place] with Dr. Samule Ohm, and we will start to make those connections.   Current Medications (verified): 1)  Coumadin 5 Mg Tabs (Warfarin Sodium) .... Take As Directed 2)  Synthroid 175 Mcg Tabs (Levothyroxine Sodium) .... Take 1 Tablet By Mouth Once A Day 3)  Captopril 25 Mg Tabs (Captopril) .... Take 1 Tablet By Mouth Two Times A Day 4)  Lasix 80 Mg Tabs (Furosemide) .... Take 1 Tablet By Mouth Once A Day 5)  Toprol Xl 50 Mg Xr24h-Tab (Metoprolol Succinate) .... Take 1 Tablet By Mouth Once A Day 6)  Allopurinol 300 Mg Tabs (Allopurinol) .... Take 1 Tablet By Mouth Once A Day 7)  Lorazepam 1 Mg Tabs (Lorazepam) .... As Needed 8)  Sucralfate 1 Gm Tabs (Sucralfate) .... Take 1 Tablet By Mouth Two Times A Day 9)  Arthritis Pain Relief 650 Mg Cr-Tabs (Acetaminophen) .... Take 2 Tabs Am and 1 Tab Pm 10)  Osteo Bi-Flex Adv Double St  Tabs (Misc Natural Products) .... Take 2 Tabs Daily 11)  Invega 3 Mg Xr24h-Tab (Paliperidone) .... Take 1 Tablet By Mouth Once A Day 12)  Vitamin B12 Shot .... Once A Month 13)  Omeprazole 20 Mg Tbec (Omeprazole) .... Take 1 Tablet By Mouth Once A Day 14)  Fish Oil 1000 Mg Caps (Omega-3 Fatty Acids) .... Take 1 Capsule By Mouth Two Times A Day 15)  Pravastatin Sodium 10 Mg Tabs (Pravastatin Sodium) .... Take One Tablet  By Mouth Daily At Bedtime  Allergies: 1)  ! Procardia  Past History:  Past Medical History: Last updated: 09/30/2008  1. Bilateral pulmonary embolus with extensive bilateral deep venous       thrombosis.  Status post IVC filter placement August 24, 2007.   2. Fever/leukocytosis of unclear source, question secondary to       pulmonary embolism/deep venous thrombosis status post 7 days       empiric treatment with IV vancomycin and Zosyn.   3. Diabetes type 2.   4. Hypothyroid.   5. Hypertension.   6. History of coronary artery disease.   7. Chronic atrial fibrillation.  INR therapeutic on Coumadin.   8. Dementia/hallucinations status post psychiatric evaluation during       this admission.   9. Kidney cyst noted on CT.  Plan for outpatient follow up as needed.   10.Severe debilitation.  Plan for transfer to skilled nursing facility       at time of discharge.   1. AFib on chronic Coumadin for over 20 years.   2. Pulmonary embolism over 10 years ago.   3. Aortic stenosis followed by Dr. [name] in cardiology.   4. Coronary artery disease, cath 2006.  No stents.  Ejection fraction  normal.   5. Hypothyroidism.   6. Dementia with recent hospitalization.   7. Hypertension.   Past Surgical History: Last updated: 09/30/2008 Mole Removal  Family History: Last updated: 09/30/2008 CVD and coronary disease in multiple family members [place] with gallbladder surgery FAMILY HISTORY:  Father had MI at the age of 26.  Mother with CHF.   Vital Signs:  Patient profile:   77 year old male Height:      72 inches Weight:      273 pounds BMI:     37.16 Pulse rate:   86 / minute Resp:     18 per minute BP sitting:   115 / 80  (left arm)  Vitals Entered By: Kem Parkinson (October 09, 2010 10:42 AM)  Physical Exam  General:  Well developed, well nourished, in no acute distress.  Moderately obese, but less so than in the past.  Head:  normocephalic and atraumatic Eyes:   PERRLA/EOM intact; conjunctiva and lids normal. Lungs:  Clear bilaterally to auscultation and percussion. Heart:  PMI non displaced.  Normal S1 and S2.  Paradoxic S2.  SEM consistent with AS, but not late peaking at present.   No diastolic murmur. Abdomen:  No masses.  Morbidly obese Pulses:  pulses normal in all 4 extremities Extremities:  No clubbing or cyanosis. Neurologic:  Alert and oriented x 3.   EKG  Procedure date:  10/09/2010  Findings:      Atrial fibrillation.  Nonspecific IVCD.  Leftward axis.  Non specific T flattening.    Echocardiogram  Procedure date:  10/09/2010  Findings:            - Left ventricle: The cavity size was normal. Wall thickness was       increased in a pattern of moderate LVH. The estimated ejection       fraction was 60%.     - Aortic valve: Severe thickening of the aortic leaflets. The       gradients are c/w severe AS. Mean gradient: 35mm Hg (S). Peak       gradient: 56mm Hg (S).     - Mitral valve: Mildly calcified annulus. Mild regurgitation.     - Left atrium: The atrium was severely dilated.     - Right ventricle: The cavity size was mildly dilated. Systolic       function was mildly reduced.     - Right atrium: The atrium was moderately to severely dilated.     - Impressions: Since the study of 07/2010, the aortic valve gradient       data is probably not significantly changed. But this is severe       disease.     Impressions:            - Since the study of 07/2010, the aortic valve gradient data is       probably not significantly changed. But this is severe disease.  Impression & Recommendations:  Problem # 1:  AORTIC STENOSIS/ INSUFFICIENCY, NON-RHEUMATIC (ICD-424.1) Severe AS.  Multiple comorbidities,and not a good surgical candidate.  Currently without symptoms of AS, although generally inactive.  I have encouraged him to walk.  We have considered TAVR as a possibility here, and will think about options.  He will see me in  follow up in three months.  His updated medication list for this problem includes:    Captopril 25 Mg Tabs (Captopril) .Marland Kitchen... Take 1 tablet by mouth two times a day  Lasix 80 Mg Tabs (Furosemide) .Marland Kitchen... Take 1 tablet by mouth once a day    Toprol Xl 50 Mg Xr24h-tab (Metoprolol succinate) .Marland Kitchen... Take 1 tablet by mouth once a day  Orders: EKG w/ Interpretation (93000) Echocardiogram (Echo)  Problem # 2:  DEEP VENOUS THROMBOPHLEBITIS, CHRONIC (ICD-453.40) He is at risk for recurrent DVT and should remain on life long coumadin in my opinion.  Problem # 3:  HYPERCHOLESTEROLEMIA  IIA (ICD-272.0) tolerates statins poorly.  on low dose prava.  His updated medication list for this problem includes:    Pravastatin Sodium 10 Mg Tabs (Pravastatin sodium) .Marland Kitchen... Take one tablet by mouth daily at bedtime  Problem # 4:  HYPERTENSION, BENIGN (ICD-401.1) seems well controlled at present time.  His updated medication list for this problem includes:    Captopril 25 Mg Tabs (Captopril) .Marland Kitchen... Take 1 tablet by mouth two times a day    Lasix 80 Mg Tabs (Furosemide) .Marland Kitchen... Take 1 tablet by mouth once a day    Toprol Xl 50 Mg Xr24h-tab (Metoprolol succinate) .Marland Kitchen... Take 1 tablet by mouth once a day  Orders: EKG w/ Interpretation (93000) Echocardiogram (Echo)  Patient Instructions: 1)  Your physician recommends that you schedule a follow-up appointment in: 3 MONTHS 2)  Your physician recommends that you continue on your current medications as directed. Please refer to the Current Medication list given to you today. 3)  Your physician has requested that you have an echocardiogram in 3 MONTHS.  Echocardiography is a painless test that uses sound waves to create images of your heart. It provides your doctor with information about the size and shape of your heart and how well your heart's chambers and valves are working.  This procedure takes approximately one hour. There are no restrictions for this procedure.

## 2010-12-16 NOTE — Assessment & Plan Note (Signed)
[facility]                            CARDIOLOGY OFFICE NOTE   CHICK, COUSINS                      MRN:          [id number]  DATE:02/08/2007                            DOB:          08/15/1928    [name] Graham is in for followup.  He has previously seen Dr. Andee Lineman.  However, I take care of his wife, and with Dr. Andee Lineman in [place], he has  now come to see me.  In general, he has been stable.  He has  nonobstructive coronary disease.  The patient also has history of  chronic atrial fibrillation.  He also has significant obesity.  He takes  Coumadin, and he is seen by Dr. Samuel Germany in [place].  He recently ate some  tomatoes and a baloney sandwich, and he got sick and nauseated.   CURRENT MEDICATIONS:  1. Lanoxin 0.125 mg daily.  2. Coumadin as directed.  3. Synthroid 200 mcg daily.  4. Carafate daily.  5. Pepcid 20 mg b.i.d.  6. Osteo Bi-flex daily.  7. Glucosamine 3 times daily.  8. Capoten 25 b.i.d.  9. Lasix 80 mg daily.   PHYSICAL EXAMINATION:  VITAL SIGNS:  Blood pressure 149/94, pulse 82.  LUNGS:  The lung fields actually are reasonably clear.  CARDIAC:  The cardiac rhythm is irregularly irregular with about a 2-3/6  systolic ejection murmur, no diastolic murmurs.  EXTREMITIES:  There is mild lower extremity edema.   The electrocardiogram demonstrates atrial fibrillation with controlled  ventricular response.   IMPRESSION:  1. Nonobstructive coronary plaquing.  2. Atrial fibrillation, persistent on warfarin anticoagulation.  3. Murmur of aortic stenosis.  4. Hypercholesterolemia with history of statin intolerance.   RECOMMENDATIONS:  1. Discontinue current verapamil.  2. Add Toprol-XL 50 mg daily.  3. Return to clinic in 2-3 week in followup with 2-D echocardiography.    Arturo Morton. [name], MD, [facility]  Electronically Signed   TDS/MedQ  DD: 03/03/2007  DT: 03/04/2007  Job #: [id number]

## 2010-12-16 NOTE — Letter (Signed)
September 21, 2008    Renae Fickle, MD  [address]  [place], [place] [address]   RE:  [name] Graham, [name] Graham  MRN:  [id number]  /  DOB:  October 09, 1928   Dear Jonny Ruiz,   I had the pleasure of seeing [name] Graham in the office today in  followup.  He continues to lose weight.  From the last office visit, he  has down from 306-286 pounds.  This is truly remarkable and he is really  starting to look quite good.  He has not had any further symptoms or  major shortness of breath.   CURRENT MEDICATIONS:  1. Coumadin as directed.  2. Synthroid 200 mcg daily.  3. Capoten 25 mg p.o. b.i.d.  4. Lasix 80 mg daily.  5. Toprol-XL 50 daily.  6. Allopurinol 300 mg daily.  7. Lanoxin 250 mcg daily.   PHYSICAL EXAMINATION:  GENERAL:  He is alert and oriented and in no  acute distress.  VITAL SIGNS:  Weight is 286 pounds.  HEART:  He has a systolic ejection murmur that is compatible with an  aortic stenosis.  It is variable in composition, largely related to  alteration of diastolic filling times from his atrial fibrillation.  No  diastolic murmurs are appreciated.  The rhythm is irregularly irregular  and somewhat slow.  EXTREMITIES:  Do not reveal significant edema.  LUNG:  Fields are entirely clear.   His electrocardiogram demonstrates normal sinus rhythm with a leftward  oriented axis.  There is low voltage QRS.  He does have evidence of some  moderate bradycardia with his ventricular rate around 54 with the atrial  fib.   Commensurate with his somewhat drop in heart rate, I suspect this is in  part related to changing pharmacologic responsiveness to his Lanoxin.  With this, we have decreased his Lanoxin to 0.25 mg 1 day alternating  with 0.125 mg on the alternative days.  This would be just a modest drop  in his overall dose.  We plan to see him back in follow up in about 3  months.  Hopefully, he will do well and I have given Ms. Leanos several  suggestions on how to monitor this.  The patient's  wife will also see me  in her own  clinic visit within the next few weeks where I will be able  to check up with his progress.  We plan to see him back in follow up in  3 months and echocardiogram was done today, and results of this will be  pending.   Thanks for allowing [place] to share in his care.    Sincerely,      Arturo Morton. [name], MD, [facility]  Electronically Signed    TDS/MedQ  DD: 09/21/2008  DT: 09/22/2008  Job #: [id number]

## 2010-12-16 NOTE — Assessment & Plan Note (Signed)
[facility]                            CARDIOLOGY OFFICE NOTE   DALBERT, STILLINGS                      MRN:          [id number]  DATE:04/30/2008                            DOB:          [birth year]    HISTORY OF PRESENT ILLNESS:  Mr. Beutler is in for a followup.  In general,  he looks dramatically different.  He has lost considerable amount of  weight.  He was in rehab, but fortunately, he is at home at this point  in time, and his wife is taking good care of him.   MEDICATIONS:  He remains on  1. Coumadin.  2. He is also on Synthroid 200 mcg daily.  3. Pepcid 20 mg b.i.d.  4. Capoten 25 mg b.i.d.  5. Lasix 80 mg daily.  6. Toprol-XL 50 mg daily.  7. Allopurinol 300 mg daily.  8. Namenda 5 mg b.i.d.  9. Lanoxin 250 mcg daily.   PHYSICAL EXAMINATION:  GENERAL:  He appears relatively well.  VITAL SIGNS:  Blood pressure is 128/70, the pulse is 65.  LUNGS:  There is slight prolonged expiration.   Overall, considering where he was, he looks dramatically better.  He  will remain on Coumadin anticoagulation at the present time.  I think  the weight loss has helped him considerably.  He has a number of things,  which could cause trouble down the line, but I think at present time he  is stable.     Arturo Morton. [name], MD, [facility]  Electronically Signed    TDS/MedQ  DD: 05/15/2008  DT: 05/15/2008  Job #: [id number]

## 2010-12-16 NOTE — Letter (Signed)
March 03, 2007    Renae Fickle, M.D.  [address]  [place], [place] [address]   RE:  PHU, RECORD  MRN:  [id number]  /  DOB:  March 28, 1929   Dear [name] Graham,   I had the pleasure of seeing [name] Graham in the office today in followup.  This very nice gentleman presents for followup of echocardiography.  We  reviewed his echocardiogram today in detail.  He has biatrial  enlargement.  He also has moderate aortic valve stenosis with a gradient  of about representing 3 meters per second.  We have recently switched  him from Verapamil to Toprol, and he says he feels better on this.   Today, his blood pressure is 130/80 and the pulse was 76 and  irregularly, irregular.  There was a systolic ejection murmur compatible  with aortic valve stenosis.  No diastolic murmurs were appreciated.  Extremities revealed 1+ edema with 2+ edema in the left lower extremity.   I think that he should have a repeat echocardiogram in six months.  We  will need to watch closely the progression of aortic stenosis.  He is  clearly better on the Toprol and he recently had laboratory studies done  in your office.  Apparently he was told he was hyperglycemic.  He also  has hypercholesterolemia but has had trouble with the Statin's but he  says he is willing to give another Statin a try and I thought I would  leave this to your discretion.   We will see him back in cardiology in three months and I appreciate the  opportunity of sharing in his care.    Sincerely,      Arturo Morton. [name], MD, [facility]  Electronically Signed    TDS/MedQ  DD: 03/03/2007  DT: 03/04/2007  Job #: [id number]

## 2010-12-16 NOTE — Discharge Summary (Signed)
NAMEAERIC, BURNHAM               ACCOUNT NO.:  [id number]   MEDICAL RECORD NO.:  [id number]          PATIENT TYPE:  INP   LOCATION:  3742                         FACILITY:  [facility]   PHYSICIAN:  Sandford Craze, NP DATE OF BIRTH:  1928-09-12   DATE OF ADMISSION:  08/23/2007  DATE OF DISCHARGE:  09/06/2007                               DISCHARGE SUMMARY   DIAGNOSIS AT TIME OF DISCHARGE:  1. Bilateral pulmonary embolus with extensive bilateral deep venous      thrombosis.  Status post IVC filter placement August 24, 2007.  2. Fever/leukocytosis of unclear source, question secondary to      pulmonary embolism/deep venous thrombosis status post 7 days      empiric treatment with IV vancomycin and Zosyn.  3. Diabetes type 2.  4. Hypothyroid.  5. Hypertension.  6. History of coronary artery disease.  7. Chronic atrial fibrillation.  INR therapeutic on Coumadin.  8. Dementia/hallucinations status post psychiatric evaluation during      this admission.  9. Kidney cyst noted on CT.  Plan for outpatient follow up as needed.  10.Severe debilitation.  Plan for transfer to skilled nursing facility      at time of discharge.   HISTORY OF PRESENT ILLNESS:  Mr. Berns is a 77 year old male admitted on  August 23, 2007, with chief complaint of chest pain.  He apparently had  a long car trip to [place] in October 2008 which was followed by  atypical left-sided chest pain.  He apparently went to a [facility], at that time, and was ruled out for MI.  However, due to  a baseline history no PE workup was done.  He was discharged.  Prior to  this admission, he noted a 1-to-3-day history of worsening left-sided  pleuritic chest pain and had been quite sedentary with decreased  activity and increasing bilateral pedal edema.  He was admitted for  further evaluation and treatment.   PAST MEDICAL HISTORY:  1. Atrial fibrillation on chronic Coumadin for over 20 years.  2.  Pulmonary embolus greater than 10 years ago.  3. Aortic stenosis followed by Dr. [name] in cardiology.  4. Coronary artery disease status post catheterization in 2006.  5. Hypothyroidism.  6. Dementia with recent hospitalization.  7. Hypertension.   COURSE OF HOSPITALIZATION:  Problem #1:  Bilateral pulmonary emboli and  bilateral extensive lower extremity DVTs.  The patient was admitted, and  was initially seen by pulmonary critical care medicine.  He was placed  on IV heparin and also underwent IVC filter placement during this  admission.  A 2-D echo performed during this admission noted a left  ventricular ejection fraction of 50%-65%.  The patient's oxygen  saturation, at time of this dictation, is 97% on room air and his INR is  therapeutic at 2.7.   Problem #2:  Acute gout exacerbation.  The patient did complain of pain  at the base of the left toe, and he was noted to have some erythema, and  severe pain with movement.  He was started on colchicine for presumed  acute gout  exacerbation.  Plan to continue colchicine, at this time, as  well as p.r.n. Ultram, we will also add p.r.n. Vicodin for pain.   Problem #3:  Fevers.  The patient did develop fevers during this  admission.  There was no clear etiology of the fever identified, and it  was felt that this may have been secondary to PE versus DVT.  The  patient did receive a 7-day course of empiric antibiotics.  His fever is  now resolved.   Problem #4:  Dementia/hallucinations.  The patient was seen in  consultation during this admission by Dr.  Jeanie Sewer, of psychiatry, due  to hallucinations and behavioral abnormalities.  The patient was  concerned that Hinda Glatter may have been the cause of his symptoms.  Dr.  Jeanie Sewer recommended decreasing Invega over 1 week and to taper off.   PHYSICAL EXAM:  VITAL SIGNS:  BP 137/75, heart rate 81, respiratory rate  18, temperature 97.7, saturating 97% on room air.  GENERAL: The patient  is a morbidly obese white male who is awake and  alert, and in no acute distress.  He is currently sitting out of bed to  chair.  CARDIOVASCULAR:  S1-S2 with regular rate and rhythm.  LUNGS:  Clear to auscultation bilaterally without increased work of  breathing.  There are no wheezes, rales or rhonchi.  ABDOMEN:  Soft, nontender, nondistended.  Positive bowel sounds are  noted.  EXTREMITIES:  The patient is noted to have 4+ left lower extremity edema  and 3-4+ right lower extremity edema.  NEURO:  The patient is moving all extremities.  He is somewhat confused;  however, speech is clear.   PERTINENT LABORATORIES:  At time of discharge INR 2.7, hemoglobin 12.8,  hematocrit 37.5 BUN 22, creatinine 1.19.   MEDICATIONS AT TIME OF DISCHARGE:  1. Capoten 25 mg p.o. b.i.d.  2. Toprol XL 50 mg p.o. daily.  3. Namenda 5 mg p.o. b.i.d.  4. Digoxin 0.25 mg p.o. daily.  5. Protonix 40 mg p.o. daily.  6. Levothyroxine 200 mcg p.o. daily.  7. Warfarin 1 tablet p.o. daily.  8. Lasix 80 mg p.o. daily.  9. NovoLog sliding scale coverage t.i.d. a.c. meals per protocol as      well as bedtime.  10.MiraLax 17 grams p.o. b.i.d.  11.Invega 3 mg p.o. nightly per psychiatry recommendations.  Dr.      Jeanie Sewer recommended tapering off over a 1 week.  Which will need      to be done at the facility.  He also recommended that the patient      does return having hallucinations and/or delusions he would treat      with Seroquel starting at 25 mg p.o. nightly, and titrate as      tolerated by 25-50 mg p.o. per day to an approximate effective dose      of 100-150 mg p.o. nightly.  He also recommended checking an EKG      q.a.c. while increasing the Seroquel if the patient does require      Seroquel, and consideration for outpatient psychiatric follow up as      needed.  12.Colchicine 0.6 mg p.o. b.i.d.  13.Coumadin to be dosed based on PT/INR.  The patient is status post 1      mg of Coumadin on September 04, 2007.  INR today is 2.7.  14.Ensure 1 can p.o. b.i.d.  15.Tramadol 50 mg p.o. q.4 h. p.r.n.  16.Vicodin 5/325 1 tablet p.o. q.4 h. p.r.n.  17.Tylenol 650 mg p.o. q.4 h. p.r.n.  18.Ativan 0.5-1 mg p.o. or IV q.3 h. p.r.n.  19.Fleet's enema per rectum p.r.n.  20.Xopenex nebs 0.63 mg q.4 h p.r.n.   DISPOSITION:  The patient will be discharged to skilled nursing  facility.  Upon discharge and as needed, he will need follow up with his  primary care Chandler Stofer who is listed as.  Dr. Renae Fickle [place].      Sandford Craze, NP     MO/MEDQ  D:  09/05/2007  T:  09/05/2007  Job:  [id number]   cc:   Dr. Renae Fickle [facility]

## 2010-12-16 NOTE — Consult Note (Signed)
NAMEOVAL, CAVAZOS NO.:  [id number]   MEDICAL RECORD NO.:  [id number]          PATIENT TYPE:  INP   LOCATION:  3742                         FACILITY:  [facility]   PHYSICIAN:  Antonietta Breach, M.D.  DATE OF BIRTH:  08-21-1928   DATE OF CONSULTATION:  08/30/2007  DATE OF DISCHARGE:                                 CONSULTATION   REASON FOR CONSULTATION:  Psychosis, agitation, side effects of  psychotropic medication.   HISTORY OF PRESENT ILLNESS:  Mr. Joles is a 77 year old male admitted to  the [facility] on August 28, 2007 with pulmonary embolism, deep  venous thrombosis, atrial fibrillation.   Mr. Haye has experienced approximately 6 weeks of progressive agitation  and delusions that his wife is going to be killed.  He also has been  experiencing hallucinations.   He was placed on Invega approximately 14 days ago that.  The medication  was increased to 6 mg daily.  He has been experiencing some side effects  of tremor as well as mild muscular rigidity.  He also has been having  sedation during the day.   The patient does not have depressed mood.  He enjoyed the company of his  wife and friend.  He is not having thoughts of harming himself or  others.  His hallucinations and delusions have resolved.  He does  persist with memory impairment and judgment impairment.   PAST PSYCHIATRIC HISTORY:  Mr. Crace does have a history of development  of memory impairment as well as judgment impairment.  He has been  started on a trial of Namenda.   Mr. Daughety cannot provide adequate history.  The patient's wife mentions  that he has not had a prior episode of hallucinations or delusions  before the past 2 months.   FAMILY PSYCHIATRIC HISTORY:  None known.   SOCIAL HISTORY:  Mr. Spradley is retired occupationally.  He has been  married for many years and lives with his supportive wife.  He does not  use alcohol or illegal drugs.   PAST MEDICAL HISTORY:  1. Atrial  fibrillation.  2. Aortic stenosis.  3. Coronary artery disease.  4. Hypothyroidism.  5. Pulmonary embolism.  6. Deep venous thrombosis.   ALLERGIES:  MORPHINE.   MEDICATIONS:  The MAR is reviewed.  The patient is on:  1. Synthroid 200 mcg daily.  2. Namenda 5 mg b.i.d.  3. Invega 6 mg p.o. daily.  4. Ativan 0.51 mg q.3 h p.r.n.   LABORATORY DATA:  WBC 7.4, hemoglobin 12.4, platelet count 239. INR 3.2.  TSH within normal limits.  Sodium 134, BUN 21, creatinine 1.1.  Hemoglobin A1c 7.6.  SGOT 35, SGPT 15.   EKG.  QTC 417 milliseconds.   REVIEW OF SYSTEMS:  Constitutional, neurologic, psychiatric  cardiovascular and respiratory unremarkable.  HEAD, EYES, EARS, NOSE,  THROAT, MOUTH:  Unremarkable.  Gastrointestinal, genitourinary, skin,  endocrine, metabolic, musculoskeletal, hematologic, lymphatic  unremarkable.   PHYSICAL EXAMINATION:  VITAL SIGNS:  Temperature 98.6, pulse 95,  respiratory rate 20, blood pressure 144/82, O2 saturation on 3 liters  93%.  GENERAL APPEARANCE:  Mr. Welter is an elderly male lying in a supine  position in the hospital bed with no abnormal involuntary movements.  Examination of bilateral elbows with passive range of motion  demonstrates mild rigidity and slight cogwheeling.   MENTAL STATUS EXAM:  Mr. Prieto is alert.  His eye contact is good.  His  attention span is mildly decreased.  His affect is slightly anxious,  mood is slightly anxious.  On orientation testing, he does not know the  year.  He is aware he is in a hospital.  He is oriented to himself.  He  recognizes his wife.   Memory testing is poor for immediate and recent, however intact for  remote.  Fund of knowledge and intelligence are below that of his  estimated premorbid baseline.  Speech involves a slightly flat prosody  There is no dysarthria.   The patient has a normal rate of speech.  Thought process is involving  partial confabulation.  Language comprehension exceeds expression.   The  patient cannot perform formal abstraction testing.  Thought content, no  thoughts of harming himself.  No thoughts of harming others, no  delusions, no hallucinations.  Insight and judgment are poor.   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  Dementia not otherwise specified (NOS).  AXIS II:  None.  AXIS III:  See general medical section.  The patient does have  apparently side effects secondary to [facility] which do include the  extrapyramidal symptoms of rigidity.  The patient also has developed an  increased hemoglobin A1c, and therefore it appears that he was having  difficulty with increased glucose levels prior to the [place].  However,  hyperglycemia is an ongoing risk if Invega.  AXIS IV:  General medical.  AXIS V:  Global assessment of functioning (GAF) 40.   The undersigned provided education with the patient's wife including  discussing the indications, alternatives and adverse effects of Invega  versus Seroquel.   The patient's wife understands and she wants to proceed as follows.   RECOMMENDATIONS:  Would decrease the Invega to 3 mg p.o. q.h.s. and  would try off with a taper off over 1 week.   If the patient does return to having hallucinations and/or delusions,  would treat with Seroquel, starting at 25 mg p.o. q.h.s. and titrate as  tolerated by 25-50 mg per day to an approximate effective dosage of 100-  150 mg q.h.s.   Given the patient's history of cardiac disease, would check an EKG QTC  while increasing the Seroquel (if the patient requires Seroquel  treatment in the future).   Outpatient psychiatric follow-up can be obtained at one of the clinics  assigned to [facility] or [facility].   Would consider an Aricept trial.  Also would confirm that a reversible  memory dysfunction etiology workup has been performed.      Antonietta Breach, M.D.  Electronically  Signed     JW/MEDQ  D:  08/30/2007  T:  08/31/2007  Job:  [id number]

## 2010-12-16 NOTE — H&P (Signed)
[name], Graham               ACCOUNT NO.:  [id number]   MEDICAL RECORD NO.:  [id number]          PATIENT TYPE:  INP   LOCATION:  1826                         FACILITY:  [facility]   PHYSICIAN:  Kalman Shan, MD   DATE OF BIRTH:  December 01, 1928   DATE OF ADMISSION:  08/23/2007  DATE OF DISCHARGE:                              HISTORY & PHYSICAL   TIME OF EVALUATION:  Between 12:10 p.m. to 12:40 p.m., August 23, 2007.   CHIEF COMPLAINT:  Chest pain, bilateral central pulmonary emboli.   HISTORY OF PRESENT ILLNESS:  [name] is a 77 year old male  with a past medical history of AFib, prior pulmonary embolism over 10  years ago, mild-moderate aortic stenosis followed by Dr. Bonnee Quin.  In October 2008, he had a long car trip to [place], spending 1 day  in the car.  At the end of it, he had some atypical left-sided chest  pain.  He went to the [facility] Emergency Room.  He was ruled out for  MI.  By history, no PE workup done.  He was discharged.  After that, he  has been doing well.  Now for the past few weeks, he has been sedentary,  decreased activity and increased bilateral pedal edema.  For the last 1-  3 days, he has had worsening left-sided pleuritic chest pain.   He denies any fevers, head, neck or abdominal pain.  No leg pain.  He  has been taking his Coumadin as prescribed.  Most recently some 2 weeks  ago, his INR was therapeutic at 2.  No other complaints.   PAST MEDICAL HISTORY:  1. AFib on chronic Coumadin for over 20 years.  2. Pulmonary embolism over 10 years ago.  3. Aortic stenosis followed by Dr. [name] in cardiology.  4. Coronary artery disease, cath 2006.  No stents.  Ejection fraction      normal.  5. Hypothyroidism.  6. Dementia with recent hospitalization.  7. Hypertension.   PAST SURGICAL HISTORY:  None.   REVIEW OF SYSTEMS:  Sedentary lifestyle due to arthritis.  Decreased  activity and increased edema for the past few weeks.   Chronic cough for  1 year.  The rest as per history of present illness.   MEDICATIONS:  1. Coumadin.  2. Carafate.  3. Tramadol.  4. Captopril.  5. Metoprolol.  6. Lanoxin.  7. Lasix.  8. Famotidine.  9. Synthroid.   ALLERGIES:  NO KNOWN ALLERGIES, but MORPHINE causes hallucinations.   SOCIAL HISTORY:  He is married and lives with his wife.  No smoking in  over 60 years.  Does not drink or abuse drugs.   FAMILY HISTORY:  Father had MI at the age of 82.  Mother with CHF.   PHYSICAL EXAMINATION:  VITAL SIGNS in the ER:  Temperature 98.3, blood  pressure 145/95, repeat blood pressure at 1 p.m. also normal, heart rate  99, saturation 85% on room air, 95% on 3 liter nasal cannula.  GENERAL:  Obese male, alert and oriented, looks tired.  HEENT:  Pupils are equal and reactive to light.  NECK:  Supple.  No elevated JVP.  CARDIOVASCULAR:  Irregularly irregular, 2/6 systolic ejection murmur,  mildly tachycardic 105-110.  LUNGS:  Clear to auscultation bilaterally.  Speaking full sentences.  No  respiratory distress.  ABDOMEN:  Obese, soft, nontender.  EXTREMITIES:  Bilateral pitting pedal edema 2+.  NEUROLOGIC:  Alert and oriented x 3.  Speech normal.  Moving all 4's.   DIAGNOSTICS:  1. EKG:  AFib with rapid ventricular rate.  No evidence of ischemia.  2. CT angio chest:  Bilateral central pulmonary embolus.   LABORATORY DATA:  Hemoglobin 15.6, white count 8.9, platelet 129,000.  Chemistries are significant for bicarb of 30, creatinine 1.4.  Coagulation:  INR subtherapeutic at 1.0, troponin is less than 0.5, D-  dimer is 10.   ASSESSMENT/PLAN:  A 77 year old male with atrial fibrillation who is  normally therapeutic on Coumadin, but currently subtherapeutic.  He had  remote pulmonary embolism over 10 years ago and a car trip 3-4 months  ago that resulted in chest pain, but unclear if he had PE.  Currently,  he has decreased activity, increasing sitting for a few weeks and new   onset pleuritic pain for a few days.  Evaluation shows central pulmonary  emboli.  Blood pressure is normal, but he is mildly hypoxemic requiring  3 liters of oxygen.   He definitely has submassive pulmonary embolism.  He possibly has right  ventricular dysfunction, definitely hypoxemic.   TREATMENT/PLAN:  1. Includes IV heparin.  2. Doppler legs.  3. Echo to rule out right ventricular dysfunction.  4. Not a candidate for thrombolytic.  Might consider it if blood      pressure drops or if RV shows significant right ventricular      dysfunction.  5. Admit to step-down unit.  6. Continue fluid and medications.  7. Full CODE.  8. Place IVC filter given prior PE and chronic coumadin therapy while      this PE happend  9. Case was discussed with Dr. Joaquin Courts, resident, whose note is      handwritten.  10.Needs long term followup to see if PE resolves, if not chronic pe      will become an issue.   The patient informed of prognosis.   Thirty minutes spent in critical care.  Bilateral Central PE with  hypoxemia but is at high risk for decompensation. Will need monitored  bed.      Kalman Shan, MD  Electronically Signed     MR/MEDQ  D:  08/23/2007  T:  08/23/2007  Job:  [id number]

## 2010-12-16 NOTE — Assessment & Plan Note (Signed)
[facility]                            CARDIOLOGY OFFICE NOTE   MAYSEN, SUDOL                      MRN:          [id number]  DATE:06/23/2007                            DOB:          Sep 20, 1928    Mr. Minix is in for a followup visit. Since I last saw him, he has been  down at [place] for what sounds like a dementia evaluation. As a  result, he has been placed on Invega ER 6 mg daily. He and his wife  discussed this with me today. He also was seen in the emergency room in  [place], [place]. He had some pain up in the shoulder. The  shoulder pain was not associated with diaphoresis. It lasted about 2-3  hours. It has been improved since he increased his Pepcid and he thinks  it may be related to his stomach.   MEDICATIONS:  1. Lanoxin 0.125 mg daily.  2. Coumadin as directed.  3. Synthroid 200 mcg daily.  4. Carafate 1-2 daily.  5. Pepcid 20 mg b.i.d.  6. Osteo Bi-flex four daily.  7. Capoten 25 mg b.i.d.  8. Lasix 80 mg daily.  9. Toprol XL 50 mg daily.  10.Omega ER 66 mg daily.   PHYSICAL EXAMINATION:  He is alert and oriented. Blood pressure 120/76,  pulse 78.  The lung fields are clear.  There is a systolic ejection murmur of moderate severity compatible with  aortic stenosis.  There is no new extremity edema. He has chronic venous changes  bilaterally.   His electrocardiogram demonstrated atrial fibrillation with controlled  ventricular response, left axis deviation. There is no acute changes.   IMPRESSION:  1. Persistent atrial fibrillation.  2. Aortic stenosis of moderate severity.  3. Non-obstructive coronary artery disease.   PLAN:  1. Return to clinic in four months.  2. 2D echo will be obtained in 8 months.  3. Continue medical regimen.  4. If he has any increase in symptoms, he is to give [place] a call.     Arturo Morton. [name], MD, [facility]  Electronically Signed    TDS/MedQ  DD: 06/23/2007  DT: 06/23/2007   Job #: [id number]   cc:   Renae Fickle

## 2010-12-19 NOTE — Cardiovascular Report (Signed)
NAMEJACKSYN, [name] Graham               ACCOUNT NO.:  [id number]   MEDICAL RECORD NO.:  [id number]          PATIENT TYPE:  OIB   LOCATION:  NA                           FACILITY:  [facility]   PHYSICIAN:  Learta Codding, M.D. LHCDATE OF BIRTH:  1928-10-14   DATE OF PROCEDURE:  05/11/2005  DATE OF DISCHARGE:                              CARDIAC CATHETERIZATION   PROCEDURE PERFORMED:  1.  Left heart catheterization.  2.  No ventriculography performed secondary to renal insufficiency.  3.  Visipaque contrast agent used.   DIAGNOSIS:  Nonobstructive coronary artery disease.   COMPLICATIONS:  None.   SEDATION:  Valium 5 mg p.o.   DESCRIPTION OF PROCEDURE:  After informed consent was obtained the patient  was brought to the outpatient JV lab.  The right groin sterilely prepped and  draped.  A 4-French JL-4 and JR-4 catheter was used for coronary  angiography.  Angiographic images were obtained in various projections using  manual injection of contrast.  No complications were encountered and after  completion of the procedure the patient was brought back to the holding area  and the arterial sheath was removed.   FINDINGS:  1.  Hemodynamics were blood pressure 129/67 mmHg, heart rate 65-70 beats per      minute, atrial fibrillation.  2.  Coronary arteriography.      1.  The left main coronary artery was short and there were near separate          ostia of the LAD and circumflex coronary artery.      2.  The left anterior descending artery was a large caliber vessel          wrapping around the apex.  There was moderate disease in the          proximal vessel to 30-40% diffuse stenosis.  The remainder of the          LAD was free of flow-limiting lesions.      3.  There were three septal perforators.  The first diagonal branch was          a large vessel.  Following this there was a 30% stenosis in the mid          LAD.      4.  The circumflex coronary artery was a large caliber vessel with  two          large marginal branches.  There were no flow-limiting lesions of the          circumflex proper and the circumflex was small in its terminal          section.      5.  Right coronary artery was a large caliber vessel somewhat with low          flow during injection with no definite obstructive lesion.  There          was diffuse atherosclerosis noted in the proximal and mid vessel          without definite stenosis.  The PDA and posterolateral branch were  patent.  The coronary circulation was right dominant.   RECOMMENDATIONS:  No evidence of significant angiographic _epicardial  coronary artery disease.  The patient is to adhere to risk factor  modification.  He should be safe from a cardiovascular prospective to  undergo knee  surgery.  His dyspnea is likely secondary to his obesity and suspected  obstructive sleep apnea.  The patient will be scheduled for a sleep study to  be performed by Dr. Maple Hudson as the patient has given preference to see him.  The patient will also followup in our office in two weeks for a BMET and  groin check.      Learta Codding, M.D. [facility]  Electronically Signed     GED/MEDQ  D:  05/11/2005  T:  05/11/2005  Job:  [id number]

## 2010-12-30 ENCOUNTER — Encounter: Payer: Self-pay | Admitting: Cardiology

## 2011-01-06 ENCOUNTER — Other Ambulatory Visit (HOSPITAL_COMMUNITY): Payer: Self-pay | Admitting: Cardiology

## 2011-01-06 DIAGNOSIS — I350 Nonrheumatic aortic (valve) stenosis: Secondary | ICD-10-CM

## 2011-01-09 ENCOUNTER — Ambulatory Visit (INDEPENDENT_AMBULATORY_CARE_PROVIDER_SITE_OTHER): Payer: Medicare Other | Admitting: Cardiology

## 2011-01-09 ENCOUNTER — Encounter: Payer: Self-pay | Admitting: Cardiology

## 2011-01-09 ENCOUNTER — Ambulatory Visit (HOSPITAL_COMMUNITY): Payer: Medicare Other | Attending: Cardiology | Admitting: Radiology

## 2011-01-09 DIAGNOSIS — I10 Essential (primary) hypertension: Secondary | ICD-10-CM | POA: Insufficient documentation

## 2011-01-09 DIAGNOSIS — I4891 Unspecified atrial fibrillation: Secondary | ICD-10-CM

## 2011-01-09 DIAGNOSIS — I359 Nonrheumatic aortic valve disorder, unspecified: Secondary | ICD-10-CM

## 2011-01-09 DIAGNOSIS — E119 Type 2 diabetes mellitus without complications: Secondary | ICD-10-CM | POA: Insufficient documentation

## 2011-01-09 DIAGNOSIS — I350 Nonrheumatic aortic (valve) stenosis: Secondary | ICD-10-CM

## 2011-01-09 DIAGNOSIS — I2699 Other pulmonary embolism without acute cor pulmonale: Secondary | ICD-10-CM

## 2011-01-09 DIAGNOSIS — I080 Rheumatic disorders of both mitral and aortic valves: Secondary | ICD-10-CM | POA: Insufficient documentation

## 2011-01-09 DIAGNOSIS — I079 Rheumatic tricuspid valve disease, unspecified: Secondary | ICD-10-CM | POA: Insufficient documentation

## 2011-01-09 DIAGNOSIS — E7800 Pure hypercholesterolemia, unspecified: Secondary | ICD-10-CM

## 2011-01-09 NOTE — Patient Instructions (Addendum)
Your physician recommends that you schedule a follow-up appointment in: 3 MONTHS  Your physician recommends that you continue on your current medications as directed. Please refer to the Current Medication list given to you today.  Your physician has requested that you have an echocardiogram in 3 MONTHS. Echocardiography is a painless test that uses sound waves to create images of your heart. It provides your doctor with information about the size and shape of your heart and how well your heart's chambers and valves are working. This procedure takes approximately one hour. There are no restrictions for this procedure.

## 2011-01-10 NOTE — Progress Notes (Signed)
HPI:  [name] is in for followup.  He had an echo today which we reviewed in detail.  He does not have much opening in his aortic valve, and with the pedoff there are jets which now exceed 4 m/s.  He has not changed symptoms wise.  He walks about 1000 feet per day, and does not seem to have a change in pattern, although this could be easily hidden with his overall situation.  Notably, he denies chest pain.  He is a candidate for both surgery and TAVR, but I think he is likely high risk with either, perhaps especially with TAVR given the current availability of devices in the [place].  I have talked to him about referral to a facility, and he is amenable at this point in time.    Current Outpatient Prescriptions  Medication Sig Dispense Refill  . acetaminophen (ARTHRITIS PAIN RELIEF) 650 MG CR tablet Take 650 mg by mouth. Take 2 tabs AM and 1 tab PM       . allopurinol (ZYLOPRIM) 300 MG tablet Take 300 mg by mouth daily.        . captopril (CAPOTEN) 25 MG tablet Take 25 mg by mouth 2 (two) times daily. As directed        . furosemide (LASIX) 80 MG tablet Take 80 mg by mouth daily.        Marland Kitchen levothyroxine (SYNTHROID) 175 MCG tablet Take 175 mcg by mouth daily.        Marland Kitchen LORazepam (ATIVAN) 1 MG tablet Take 1 mg by mouth as needed.        . metoprolol (TOPROL XL) 50 MG 24 hr tablet Take 50 mg by mouth daily.        . Misc Natural Products (OSTEO BI-FLEX ADV DOUBLE ST PO) Take by mouth. Take 2 tabs daily       . Omega-3 Fatty Acids (FISH OIL) 1000 MG CAPS Take by mouth. Take 1 capsule by mouth two times a day       . Omeprazole 20 MG TBEC Take by mouth. 1 tablet once a day       . paliperidone (INVEGA) 3 MG 24 hr tablet Take 3 mg by mouth daily.        . pravastatin (PRAVACHOL) 10 MG tablet Take 10 mg by mouth daily. Take 2 tabs daily       . sucralfate (CARAFATE) 1 G tablet Take 1 g by mouth 2 (two) times daily.        Marland Kitchen warfarin (COUMADIN) 5 MG tablet Take 5 mg by mouth as directed.           Allergies  Allergen Reactions  . Nifedipine     Past Medical History  Diagnosis Date  . Pulmonary embolus     bilateral  . Diabetes mellitus     type 2  . Hypothyroid   . Hypertension   . CAD (coronary artery disease)   . Chronic atrial fibrillation     INR therpeutic on Coumadin  . Dementia     hallucinations status post psychiatric evaluation during admission  . Kidney cyst, acquired   . A-fib   . CAD (coronary artery disease)     cath 2006, no stents, ejection fraction  . Leukocytosis     Past Surgical History  Procedure Date  . Mole removakl     Family History  Problem Relation Age of Onset  . Heart failure Mother   . Microcephaly Father  History   Social History  . Marital Status: Married    Spouse Name: N/A    Number of Children: N/A  . Years of Education: N/A   Occupational History  . Not on file.   Social History Main Topics  . Smoking status: Former Games developer  . Smokeless tobacco: Not on file  . Alcohol Use: No  . Drug Use: No  . Sexually Active: Not on file   Other Topics Concern  . Not on file   Social History Narrative   Has not smoked in 60 years    ROS: Please see the HPI.  All other systems reviewed and negative.  PHYSICAL EXAM:  BP 104/70  Pulse 68  Ht 6' (1.829 m)  Wt 272 lb (123.378 kg)  BMI 36.89 kg/m2  General: Well developed, well nourished, in no acute distress. Head:  Normocephalic and atraumatic. Neck: no JVD.  Some delay in the carotids. Lungs: Clear to auscultation and percussion. Heart: Normal S1 and probably fixed S2 (hard to hear).  SEM of 3/6, LSB, with transmission to the carotids. Variable intensity associated with Afib.  Abdomen:  Normal bowel sounds; soft; non tender; no organomegaly Pulses: Pulses normal in all 4 extremities. Extremities: Chronic venous changes. Neurologic: Alert and oriented x 3.  EKG:  Atrial fibrillation.  Controlled ventricular response.  Rate is 68.  ASSESSMENT AND  PLAN:

## 2011-01-11 NOTE — Assessment & Plan Note (Signed)
On statins at present.

## 2011-01-11 NOTE — Assessment & Plan Note (Signed)
See study information above.  The patient has progressive AS.  He is not really symptomatic, but his activity level is such that he might not be.  We plan for him to return in three months.  I in the meantime will begin making arrangements for a valve clinic visit for consideration of options for treatment.  The patient is agreeable with this plan, as is his wife who is here for another visit herself today.

## 2011-01-11 NOTE — Assessment & Plan Note (Signed)
Currently controlled.

## 2011-01-11 NOTE — Assessment & Plan Note (Signed)
Rate is nicely controlled at the present.  Continue current treatment.  On long term warfarin.

## 2011-01-11 NOTE — Assessment & Plan Note (Signed)
High risk for recurrence--on chronic warfarin therapy.

## 2011-03-16 ENCOUNTER — Telehealth: Payer: Self-pay | Admitting: Cardiology

## 2011-03-16 NOTE — Telephone Encounter (Signed)
Duplicate Encounter

## 2011-03-16 NOTE — Telephone Encounter (Signed)
Per pt wife calling. Pt wife said Dr. [name] was suppose to set up pt appt w/ Dr. Samule Ohm in [place] for tomorrow. However pt has not heard back from Dr. [name] or nurse regarding date and time of appt. Please return pt call to advise/discuss.

## 2011-03-16 NOTE — Telephone Encounter (Signed)
Pt is to go see Dr. Samule Ohm in [place] tomorrow and they have not directions no time or anything so they are wondering do they still have an appt

## 2011-03-16 NOTE — Telephone Encounter (Signed)
Left a message to call back.

## 2011-03-16 NOTE — Telephone Encounter (Signed)
RN s/w Rose at [facility] in [place] to confirm Appt. Pt has appointments to see Dr Henreitta Leber and Dr Samule Ohm for 03/17/2011 @ 1:30 PM. RN s/w Dr [name], OV, Discharge Summary, Echo reports and Echo CD completed and placed in packet at front desk. RN s/w Pt's wife re: appt time, address and packet to pick up at front desk to take to [employer] tomorrow. Pt's wife verbalizes understanding.

## 2011-03-16 NOTE — Telephone Encounter (Signed)
RN s/w Pt

## 2011-04-14 ENCOUNTER — Ambulatory Visit: Payer: Medicare Other | Admitting: Cardiology

## 2011-04-14 ENCOUNTER — Other Ambulatory Visit (HOSPITAL_COMMUNITY): Payer: Medicare Other | Admitting: Radiology

## 2011-04-15 ENCOUNTER — Ambulatory Visit (HOSPITAL_COMMUNITY): Payer: Medicare Other | Attending: Cardiology | Admitting: Radiology

## 2011-04-15 ENCOUNTER — Encounter: Payer: Self-pay | Admitting: Cardiology

## 2011-04-15 ENCOUNTER — Ambulatory Visit (INDEPENDENT_AMBULATORY_CARE_PROVIDER_SITE_OTHER): Payer: Medicare Other | Admitting: Cardiology

## 2011-04-15 DIAGNOSIS — I359 Nonrheumatic aortic valve disorder, unspecified: Secondary | ICD-10-CM

## 2011-04-15 DIAGNOSIS — E7800 Pure hypercholesterolemia, unspecified: Secondary | ICD-10-CM

## 2011-04-15 DIAGNOSIS — I10 Essential (primary) hypertension: Secondary | ICD-10-CM | POA: Insufficient documentation

## 2011-04-15 DIAGNOSIS — E785 Hyperlipidemia, unspecified: Secondary | ICD-10-CM | POA: Insufficient documentation

## 2011-04-15 DIAGNOSIS — E119 Type 2 diabetes mellitus without complications: Secondary | ICD-10-CM | POA: Insufficient documentation

## 2011-04-15 DIAGNOSIS — I4891 Unspecified atrial fibrillation: Secondary | ICD-10-CM

## 2011-04-15 DIAGNOSIS — I379 Nonrheumatic pulmonary valve disorder, unspecified: Secondary | ICD-10-CM | POA: Insufficient documentation

## 2011-04-15 DIAGNOSIS — I080 Rheumatic disorders of both mitral and aortic valves: Secondary | ICD-10-CM | POA: Insufficient documentation

## 2011-04-15 DIAGNOSIS — I079 Rheumatic tricuspid valve disease, unspecified: Secondary | ICD-10-CM | POA: Insufficient documentation

## 2011-04-15 NOTE — Progress Notes (Signed)
HPI:  Saw Dr. Samule Ohm, then had CT of the chest and legs.  PV was ok for TAVR, but root is more than 29, too large for the currently available valves.  Overall he is stable and symptoms have not progressed.  I spoke with Dr. Samule Ohm today about all of this.  Dr. Samule Ohm also called to inform me that he has a coronary aneurysm of about 9mm, but was not sure what to think about that.  The patient is asymptomatic.  He is able to get about without any deterioration in his function.    Current Outpatient Prescriptions  Medication Sig Dispense Refill  . acetaminophen (ARTHRITIS PAIN RELIEF) 650 MG CR tablet Take 650 mg by mouth. Take 2 tabs AM and 1 tab PM       . allopurinol (ZYLOPRIM) 300 MG tablet Take 300 mg by mouth daily.        . captopril (CAPOTEN) 25 MG tablet Take 25 mg by mouth 2 (two) times daily. As directed        . furosemide (LASIX) 80 MG tablet Take 80 mg by mouth daily.        Marland Kitchen levothyroxine (SYNTHROID) 175 MCG tablet Take 175 mcg by mouth daily.        Marland Kitchen LORazepam (ATIVAN) 1 MG tablet Take 1 mg by mouth as needed.        . metoprolol (TOPROL XL) 50 MG 24 hr tablet Take 50 mg by mouth daily.        . Misc Natural Products (OSTEO BI-FLEX ADV DOUBLE ST PO) Take by mouth. Take 2 tabs daily       . Omega-3 Fatty Acids (FISH OIL) 1000 MG CAPS Take by mouth. Take 1 capsule by mouth two times a day       . Omeprazole 20 MG TBEC Take by mouth. 1 tablet once a day       . paliperidone (INVEGA) 3 MG 24 hr tablet Take 3 mg by mouth daily.        . pravastatin (PRAVACHOL) 10 MG tablet Take 10 mg by mouth daily. Take 2 tabs daily       . sucralfate (CARAFATE) 1 G tablet Take 1 g by mouth 2 (two) times daily.        Marland Kitchen warfarin (COUMADIN) 5 MG tablet Take 5 mg by mouth as directed.          Allergies  Allergen Reactions  . Nifedipine     Past Medical History  Diagnosis Date  . Pulmonary embolus     bilateral  . Diabetes mellitus     type 2  . Hypothyroid   . Hypertension   . CAD  (coronary artery disease)   . Chronic atrial fibrillation     INR therpeutic on Coumadin  . Dementia     hallucinations status post psychiatric evaluation during admission  . Kidney cyst, acquired   . A-fib   . CAD (coronary artery disease)     cath 2006, no stents, ejection fraction  . Leukocytosis     Past Surgical History  Procedure Date  . Mole removakl     Family History  Problem Relation Age of Onset  . Heart failure Mother   . Microcephaly Father     History   Social History  . Marital Status: Married    Spouse Name: N/A    Number of Children: N/A  . Years of Education: N/A   Occupational History  . Not  on file.   Social History Main Topics  . Smoking status: Former Games developer  . Smokeless tobacco: Not on file  . Alcohol Use: No  . Drug Use: No  . Sexually Active: Not on file   Other Topics Concern  . Not on file   Social History Narrative   Has not smoked in 60 years    ROS: Please see the HPI.  All other systems reviewed and negative.  PHYSICAL EXAM:  BP 112/82  Pulse 80  Ht 6' (1.829 m)  Wt 274 lb (124.286 kg)  BMI 37.16 kg/m2  General: Well developed, well nourished, in no acute distress. Head:  Normocephalic and atraumatic. Neck: no JVD Lungs: Clear to auscultation and percussion. Heart: Normal S1 and single S2.  SEM  4/6.  No DM.  Abdomen:  Normal bowel sounds; soft; non tender; no organomegaly Pulses: Pulses normal in all 4 extremities. Extremities: No clubbing or cyanosis. 1-2 edema bilaterally. Neurologic: Alert and oriented x 3.  EKG:  Atrial fibrillation with controlled ventricular response.  Leftward axis.    ASSESSMENT AND PLAN:

## 2011-04-15 NOTE — Patient Instructions (Signed)
Your physician recommends that you return for lab work in: [employer])  Your physician recommends that you schedule a follow-up appointment in: 3 months

## 2011-04-16 LAB — BASIC METABOLIC PANEL
BUN: 21 mg/dL (ref 6–23)
CO2: 31 mEq/L (ref 19–32)
Chloride: 101 mEq/L (ref 96–112)
Glucose, Bld: 104 mg/dL — ABNORMAL HIGH (ref 70–99)
Potassium: 3.7 mEq/L (ref 3.5–5.1)

## 2011-04-19 NOTE — Assessment & Plan Note (Signed)
Followed by his primary care MD.   

## 2011-04-19 NOTE — Assessment & Plan Note (Signed)
Currently controlled on metoprolol, and with appropriate prophylaxis for stroke, as well as given history of DVT, and PE-----on warfarin.

## 2011-04-19 NOTE — Assessment & Plan Note (Signed)
Severe AS but not a candidate for SE valve secondary to root size of 29.5  He would be a candidate for COR valve, 31 mm, and I can make arrangements for him potentially to be seen at [facility]. Will discuss with Dr. Adella Hare.

## 2011-04-24 LAB — CK TOTAL AND CKMB (NOT AT ARMC)
CK, MB: 1.6
Relative Index: 1.6
Relative Index: INVALID
Total CK: 56

## 2011-04-24 LAB — CBC
HCT: 37.9 — ABNORMAL LOW
HCT: 40.1
HCT: 46.3
Hemoglobin: 12.5 — ABNORMAL LOW
Hemoglobin: 13.6
Hemoglobin: 13.9
MCHC: 33.6
MCHC: 33.6
MCHC: 33.7
MCHC: 33.9
MCHC: 33.9
MCHC: 33.9
MCHC: 34
MCHC: 34
MCHC: 34.1
MCHC: 34.1
MCHC: 34.2
MCHC: 34.2
MCHC: 34.4
MCHC: 33.6
MCV: 98.4
MCV: 98.4
MCV: 98.7
MCV: 98.7
MCV: 98.9
MCV: 99.2
MCV: 99.3
MCV: 99.6
MCV: 99.6
Platelets: 124 — ABNORMAL LOW
Platelets: 127 — ABNORMAL LOW
Platelets: 129 — ABNORMAL LOW
Platelets: 136 — ABNORMAL LOW
Platelets: 143 — ABNORMAL LOW
Platelets: 145 — ABNORMAL LOW
Platelets: 182
Platelets: 202
Platelets: 239
Platelets: 300
Platelets: 329
Platelets: 345
Platelets: 323
Platelets: 357
RBC: 3.59 — ABNORMAL LOW
RBC: 3.75 — ABNORMAL LOW
RBC: 4.03 — ABNORMAL LOW
RDW: 14.1
RDW: 14.2
RDW: 14.4
RDW: 14.4
RDW: 14.5
RDW: 14.6
RDW: 14.6
RDW: 14.6
RDW: 14.6
RDW: 14.6
RDW: 14.8
RDW: 14.6
RDW: 14.9
WBC: 8.3
WBC: 8.6
WBC: 8.7
WBC: 8.9
WBC: 7.2

## 2011-04-24 LAB — BASIC METABOLIC PANEL
BUN: 16
BUN: 17
BUN: 20
CO2: 25
CO2: 26
CO2: 27
CO2: 27
CO2: 30
CO2: 31
Calcium: 8.1 — ABNORMAL LOW
Calcium: 8.3 — ABNORMAL LOW
Calcium: 8.5
Calcium: 8.5
Chloride: 102
Chloride: 105
Chloride: 97
Creatinine, Ser: 1.1
Creatinine, Ser: 1.19
Creatinine, Ser: 1.26
Creatinine, Ser: 1.35
GFR calc Af Amer: >60
GFR calc Af Amer: >60
GFR calc non Af Amer: 49 — ABNORMAL LOW
Glucose, Bld: 133 — ABNORMAL HIGH
Glucose, Bld: 140 — ABNORMAL HIGH
Glucose, Bld: 148 — ABNORMAL HIGH
Glucose, Bld: 182 — ABNORMAL HIGH
Potassium: 3.8
Sodium: 135
Sodium: 137
Sodium: 139

## 2011-04-24 LAB — BLOOD GAS, ARTERIAL
Acid-Base Excess: 1.3
Drawn by: 287601
O2 Content: 3
O2 Saturation: 92
Patient temperature: 98.6
pO2, Arterial: 64.4 — ABNORMAL LOW

## 2011-04-24 LAB — PROTIME-INR
INR: 1.1
INR: 2.6 — ABNORMAL HIGH
INR: 3.6 — ABNORMAL HIGH
INR: 3.2 — ABNORMAL HIGH
Prothrombin Time: 14.8
Prothrombin Time: 15.1
Prothrombin Time: 15.6 — ABNORMAL HIGH
Prothrombin Time: 17.6 — ABNORMAL HIGH
Prothrombin Time: 34.2 — ABNORMAL HIGH
Prothrombin Time: 37.6 — ABNORMAL HIGH
Prothrombin Time: 29.9 — ABNORMAL HIGH
Prothrombin Time: 34.3 — ABNORMAL HIGH

## 2011-04-24 LAB — I-STAT 8, (EC8 V) (CONVERTED LAB)
Bicarbonate: 29.9 — ABNORMAL HIGH
Glucose, Bld: 182 — ABNORMAL HIGH
TCO2: 31
pH, Ven: 7.478 — ABNORMAL HIGH

## 2011-04-24 LAB — MAGNESIUM
Magnesium: 2.1
Magnesium: 2.1

## 2011-04-24 LAB — HEPATIC FUNCTION PANEL
Albumin: 3.1 — ABNORMAL LOW
Bilirubin, Direct: 0.3
Total Bilirubin: 1.6 — ABNORMAL HIGH

## 2011-04-24 LAB — APTT
aPTT: 145 — ABNORMAL HIGH
aPTT: 78 — ABNORMAL HIGH
aPTT: 91 — ABNORMAL HIGH

## 2011-04-24 LAB — URINE CULTURE
Colony Count: 10000
Colony Count: NO GROWTH
Culture: NO GROWTH
Special Requests: NEGATIVE

## 2011-04-24 LAB — COMPREHENSIVE METABOLIC PANEL
Albumin: 3.4 — ABNORMAL LOW
Alkaline Phosphatase: 60
BUN: 16
Calcium: 8.8
Potassium: 4.7
Total Protein: 6.5

## 2011-04-24 LAB — POCT CARDIAC MARKERS
Myoglobin, poc: 202
Operator id: 294521
Troponin i, poc: <0.05

## 2011-04-24 LAB — URINE MICROSCOPIC-ADD ON

## 2011-04-24 LAB — HEMOGLOBIN A1C: Hgb A1c MFr Bld: 7.6 — ABNORMAL HIGH

## 2011-04-24 LAB — HEPARIN LEVEL (UNFRACTIONATED)
Heparin Unfractionated: 0.23 — ABNORMAL LOW
Heparin Unfractionated: 0.27 — ABNORMAL LOW
Heparin Unfractionated: 0.34
Heparin Unfractionated: 0.34
Heparin Unfractionated: 0.35
Heparin Unfractionated: 0.4
Heparin Unfractionated: <0.1 — ABNORMAL LOW

## 2011-04-24 LAB — DIFFERENTIAL
Basophils Relative: 1
Eosinophils Absolute: 0.3
Eosinophils Relative: 3
Lymphs Abs: 2.1

## 2011-04-24 LAB — CULTURE, BLOOD (ROUTINE X 2)

## 2011-04-24 LAB — TROPONIN I: Troponin I: 0.09 — ABNORMAL HIGH

## 2011-04-24 LAB — D-DIMER, QUANTITATIVE: D-Dimer, Quant: 9.99 — ABNORMAL HIGH

## 2011-04-24 LAB — DIGOXIN LEVEL: Digoxin Level: 0.4 — ABNORMAL LOW

## 2011-04-24 LAB — URINALYSIS, ROUTINE W REFLEX MICROSCOPIC
Glucose, UA: NEGATIVE
Hgb urine dipstick: NEGATIVE
Ketones, ur: NEGATIVE
Leukocytes, UA: NEGATIVE
Protein, ur: 30 — AB
pH: 6

## 2011-04-24 LAB — PHOSPHORUS
Phosphorus: 3.2
Phosphorus: 3.3
Phosphorus: 3.6

## 2011-04-24 LAB — POCT I-STAT CREATININE: Operator id: 294521

## 2011-04-24 LAB — TSH: TSH: 3.895

## 2011-07-15 ENCOUNTER — Ambulatory Visit (INDEPENDENT_AMBULATORY_CARE_PROVIDER_SITE_OTHER): Payer: Medicare Other | Admitting: Cardiology

## 2011-07-15 ENCOUNTER — Encounter: Payer: Self-pay | Admitting: Cardiology

## 2011-07-15 VITALS — BP 128/76 | HR 77 | Ht 72.0 in | Wt 276.8 lb

## 2011-07-15 DIAGNOSIS — I4891 Unspecified atrial fibrillation: Secondary | ICD-10-CM

## 2011-07-15 DIAGNOSIS — E7800 Pure hypercholesterolemia, unspecified: Secondary | ICD-10-CM

## 2011-07-15 DIAGNOSIS — I359 Nonrheumatic aortic valve disorder, unspecified: Secondary | ICD-10-CM

## 2011-07-15 DIAGNOSIS — I2541 Coronary artery aneurysm: Secondary | ICD-10-CM

## 2011-07-15 DIAGNOSIS — I350 Nonrheumatic aortic (valve) stenosis: Secondary | ICD-10-CM

## 2011-07-15 NOTE — Patient Instructions (Signed)
Your physician has requested that you have an echocardiogram. Echocardiography is a painless test that uses sound waves to create images of your heart. It provides your doctor with information about the size and shape of your heart and how well your heart's chambers and valves are working. This procedure takes approximately one hour. There are no restrictions for this procedure.   Your physician recommends that you schedule a follow-up appointment in: [facility]

## 2011-07-18 DIAGNOSIS — I2541 Coronary artery aneurysm: Secondary | ICD-10-CM | POA: Insufficient documentation

## 2011-07-18 NOTE — Progress Notes (Signed)
Patient ID: [name] Graham, male   DOB: 09/23/1928, 76 y.o.   MRN: [id number]

## 2011-07-18 NOTE — Assessment & Plan Note (Signed)
See overview.  Would only suggest observation, and restudy at time of TAVR if done.

## 2011-07-18 NOTE — Assessment & Plan Note (Signed)
Labs done locally by his primary MD.

## 2011-07-18 NOTE — Assessment & Plan Note (Signed)
Patient is stable at the present time.  No progression in symptoms.  He has severe AS, but is limited in his activity anyway.  His risks are high for treatment, although his peripheral vessels are likely adequate for TAVR.  He would currently require treatment with the Core Valve, as his annulus is such that it is too big for the current generation of Edwards devices according to Dr. Samule Ohm.  Will continue to follow, with close follow up of his echoes over time. He has been thoroughly discussed with his family, and plan discussed.

## 2011-07-18 NOTE — Progress Notes (Signed)
.   HPI:  He is holding his own.  He is, by his own words, doing well.  He can walk up to 500 feet without any symptoms, and there has been no deterioration in his status.  He denies chest pain.  He feels good.  He is pretty emphatic about the fact that he wants to wait as long as possible before getting anything done.  He is comfortable with that decision.  He is getting his labs done.   Current Outpatient Prescriptions  Medication Sig Dispense Refill  . acetaminophen (ARTHRITIS PAIN RELIEF) 650 MG CR tablet Take 650 mg by mouth. Take 2 tabs AM and 1 tab PM       . allopurinol (ZYLOPRIM) 300 MG tablet Take 300 mg by mouth daily.        . captopril (CAPOTEN) 25 MG tablet Take 25 mg by mouth 2 (two) times daily. As directed        . furosemide (LASIX) 80 MG tablet Take 80 mg by mouth daily.        Marland Kitchen levothyroxine (SYNTHROID) 175 MCG tablet Take 175 mcg by mouth daily.        Marland Kitchen LORazepam (ATIVAN) 1 MG tablet Take 1 mg by mouth as needed.        . metoprolol (TOPROL XL) 50 MG 24 hr tablet Take 50 mg by mouth daily.        . Misc Natural Products (OSTEO BI-FLEX ADV DOUBLE ST PO) Take by mouth. Take 2 tabs daily       . Omega-3 Fatty Acids (FISH OIL) 1000 MG CAPS Take by mouth. Take 1 capsule by mouth two times a day       . Omeprazole 20 MG TBEC Take by mouth. 1 tablet once a day       . paliperidone (INVEGA) 3 MG 24 hr tablet Take 3 mg by mouth daily.        . pravastatin (PRAVACHOL) 10 MG tablet Take 10 mg by mouth daily. Take 2 tabs daily       . sucralfate (CARAFATE) 1 G tablet Take 1 g by mouth 2 (two) times daily.        Marland Kitchen warfarin (COUMADIN) 5 MG tablet Take 5 mg by mouth as directed.          Allergies  Allergen Reactions  . Nifedipine     Past Medical History  Diagnosis Date  . Pulmonary embolus     bilateral  . Diabetes mellitus     type 2  . Hypothyroid   . Hypertension   . CAD (coronary artery disease)   . Chronic atrial fibrillation     INR therpeutic on Coumadin  .  Dementia     hallucinations status post psychiatric evaluation during admission  . Kidney cyst, acquired   . A-fib   . CAD (coronary artery disease)     cath 2006, no stents, ejection fraction  . Leukocytosis     Past Surgical History  Procedure Date  . Mole removakl     Family History  Problem Relation Age of Onset  . Heart failure Mother   . Microcephaly Father     History   Social History  . Marital Status: Married    Spouse Name: N/A    Number of Children: N/A  . Years of Education: N/A   Occupational History  . Not on file.   Social History Main Topics  . Smoking status: Former Games developer  .  Smokeless tobacco: Not on file  . Alcohol Use: No  . Drug Use: No  . Sexually Active: Not on file   Other Topics Concern  . Not on file   Social History Narrative   Has not smoked in 60 years    ROS: Please see the HPI.  All other systems reviewed and negative.  PHYSICAL EXAM:  BP 128/76  Pulse 77  Ht 6' (1.829 m)  Wt 125.556 kg (276 lb 12.8 oz)  BMI 37.54 kg/m2  General: Well developed, well nourished, in no acute distress.  Somewhat flat affect Head:  Normocephalic and atraumatic. Neck: no JVD Lungs: Clear to auscultation and percussion. Heart: Normal S1 and S2.  Mid to late peaking SEM, consistent with AS.  No DM.  No rub.   Abdomen:  Normal bowel sounds; soft; non tender; no organomegaly Pulses: Pulses normal in all 4 extremities. Extremities: No clubbing or cyanosis. Chronic changes of venous incompetence.  Neurologic: Alert and oriented x 3.  EKG:  Atrial fib.  Controlled ventricular response.  Nonspecific ST and T changes.  Cath October 2006   FINDINGS:  1.  Hemodynamics were blood pressure 129/67 mmHg, heart rate 65-70 beats per      minute, atrial fibrillation.  2.  Coronary arteriography.      1.  The left main coronary artery was short and there were near separate          ostia of the LAD and circumflex coronary artery.      2.  The left  anterior descending artery was a large caliber vessel          wrapping around the apex.  There was moderate disease in the          proximal vessel to 30-40% diffuse stenosis.  The remainder of the          LAD was free of flow-limiting lesions.      3.  There were three septal perforators.  The first diagonal branch was          a large vessel.  Following this there was a 30% stenosis in the mid          LAD.      4.  The circumflex coronary artery was a large caliber vessel with two          large marginal branches.  There were no flow-limiting lesions of the          circumflex proper and the circumflex was small in its terminal          section.      5.  Right coronary artery was a large caliber vessel somewhat with low          flow during injection with no definite obstructive lesion.  There          was diffuse atherosclerosis noted in the proximal and mid vessel          without definite stenosis.  The PDA and posterolateral branch were          patent.  The coronary circulation was right dominant.   ASSESSMENT AND PLAN:

## 2011-07-18 NOTE — Assessment & Plan Note (Signed)
Remains on anticoagulation for atrial fib,and prior DVT/PE.  Long term anticoagulation advised.

## 2011-08-21 ENCOUNTER — Encounter: Payer: Self-pay | Admitting: Cardiology

## 2011-09-08 ENCOUNTER — Ambulatory Visit (HOSPITAL_COMMUNITY): Payer: Medicare Other | Attending: Cardiology | Admitting: Radiology

## 2011-09-08 DIAGNOSIS — Z86711 Personal history of pulmonary embolism: Secondary | ICD-10-CM | POA: Insufficient documentation

## 2011-09-08 DIAGNOSIS — I4891 Unspecified atrial fibrillation: Secondary | ICD-10-CM | POA: Insufficient documentation

## 2011-09-08 DIAGNOSIS — I10 Essential (primary) hypertension: Secondary | ICD-10-CM | POA: Insufficient documentation

## 2011-09-08 DIAGNOSIS — I350 Nonrheumatic aortic (valve) stenosis: Secondary | ICD-10-CM

## 2011-09-08 DIAGNOSIS — I2510 Atherosclerotic heart disease of native coronary artery without angina pectoris: Secondary | ICD-10-CM | POA: Insufficient documentation

## 2011-09-08 DIAGNOSIS — Z87891 Personal history of nicotine dependence: Secondary | ICD-10-CM | POA: Insufficient documentation

## 2011-09-08 DIAGNOSIS — I359 Nonrheumatic aortic valve disorder, unspecified: Secondary | ICD-10-CM | POA: Insufficient documentation

## 2011-09-08 DIAGNOSIS — E669 Obesity, unspecified: Secondary | ICD-10-CM | POA: Insufficient documentation

## 2011-09-08 DIAGNOSIS — E785 Hyperlipidemia, unspecified: Secondary | ICD-10-CM | POA: Insufficient documentation

## 2011-09-11 ENCOUNTER — Ambulatory Visit (INDEPENDENT_AMBULATORY_CARE_PROVIDER_SITE_OTHER): Payer: Medicare Other | Admitting: Cardiology

## 2011-09-11 ENCOUNTER — Encounter: Payer: Self-pay | Admitting: Cardiology

## 2011-09-11 VITALS — BP 133/81 | HR 65 | Ht 72.0 in | Wt 274.0 lb

## 2011-09-11 DIAGNOSIS — I10 Essential (primary) hypertension: Secondary | ICD-10-CM

## 2011-09-11 DIAGNOSIS — G459 Transient cerebral ischemic attack, unspecified: Secondary | ICD-10-CM

## 2011-09-11 DIAGNOSIS — I2699 Other pulmonary embolism without acute cor pulmonale: Secondary | ICD-10-CM

## 2011-09-11 DIAGNOSIS — I4891 Unspecified atrial fibrillation: Secondary | ICD-10-CM

## 2011-09-11 DIAGNOSIS — I2541 Coronary artery aneurysm: Secondary | ICD-10-CM

## 2011-09-11 DIAGNOSIS — I359 Nonrheumatic aortic valve disorder, unspecified: Secondary | ICD-10-CM

## 2011-09-11 NOTE — Patient Instructions (Signed)
Your physician recommends that you schedule a follow-up appointment in: 2 months.  Your physician recommends that you continue on your current medications as directed. Please refer to the Current Medication list given to you today.  

## 2011-09-11 NOTE — Progress Notes (Signed)
HPI:  He underwent a repeat echo and we reviewed those findings.  In addition, he was admitted to [facility] with what sounded like a TIA, and he is being treated medically.  I spent over thirty minutes reviewing these findings, and the records that were associated with this.  MRI revealed atrophy and small vessel changes.  There was cardiomegaly and mild pulmonary vascular congestion.  CT was negative, and dopplers were unremarkable, although the left vertebral was not visualized.  He was treated for a TIA at that time.  They added ASA on a qod basis.  Nothing new was added except for that.  Denies any chest pain.  He still is able to do his walk to the mailbox without symptoms.  His major presentation was that of intermittent left sided numbness, a new finding for him.    Current Outpatient Prescriptions  Medication Sig Dispense Refill  . acetaminophen (ARTHRITIS PAIN RELIEF) 650 MG CR tablet Take 650 mg by mouth. Take 2 tabs AM and 1 tab PM       . allopurinol (ZYLOPRIM) 300 MG tablet Take 300 mg by mouth daily.        . captopril (CAPOTEN) 25 MG tablet Take 25 mg by mouth 2 (two) times daily. As directed        . Cyanocobalamin (VITAMIN B-12 IJ) Inject as directed every 30 (thirty) days.      . furosemide (LASIX) 80 MG tablet Take 80 mg by mouth daily.        Marland Kitchen levothyroxine (SYNTHROID) 175 MCG tablet Take 175 mcg by mouth daily.        Marland Kitchen LORazepam (ATIVAN) 1 MG tablet Take 1 mg by mouth as needed.        . metoprolol (TOPROL XL) 50 MG 24 hr tablet Take 50 mg by mouth daily.        . Misc Natural Products (OSTEO BI-FLEX ADV DOUBLE ST PO) Take by mouth. Take 2 tabs daily       . Omega-3 Fatty Acids (FISH OIL) 1000 MG CAPS Take by mouth. Take 1 capsule by mouth two times a day       . Omeprazole 20 MG TBEC Take by mouth. 1 tablet once a day       . paliperidone (INVEGA) 3 MG 24 hr tablet Take 3 mg by mouth daily.        . pravastatin (PRAVACHOL) 10 MG tablet Take 10 mg by mouth daily.  Take 2 tabs daily       . sucralfate (CARAFATE) 1 G tablet Take 1 g by mouth 2 (two) times daily.        Marland Kitchen warfarin (COUMADIN) 5 MG tablet Take 5 mg by mouth as directed.          Allergies  Allergen Reactions  . Nifedipine     Past Medical History  Diagnosis Date  . Pulmonary embolus     bilateral  . Diabetes mellitus     type 2  . Hypothyroid   . Hypertension   . CAD (coronary artery disease)   . Chronic atrial fibrillation     INR therpeutic on Coumadin  . Dementia     hallucinations status post psychiatric evaluation during admission  . Kidney cyst, acquired   . A-fib   . CAD (coronary artery disease)     cath 2006, no stents, ejection fraction  . Leukocytosis     Past Surgical History  Procedure Date  .  Mole removakl     Family History  Problem Relation Age of Onset  . Heart failure Mother   . Microcephaly Father     History   Social History  . Marital Status: Married    Spouse Name: N/A    Number of Children: N/A  . Years of Education: N/A   Occupational History  . Not on file.   Social History Main Topics  . Smoking status: Former Games developer  . Smokeless tobacco: Not on file  . Alcohol Use: No  . Drug Use: No  . Sexually Active: Not on file   Other Topics Concern  . Not on file   Social History Narrative   Has not smoked in 60 years    ROS: Please see the HPI.  All other systems reviewed and negative.  PHYSICAL EXAM:  BP 133/81  Pulse 65  Ht 6' (1.829 m)  Wt 274 lb (124.286 kg)  BMI 37.16 kg/m2  General: Well developed, well nourished, in no acute distress.  Parkinsonian tremor. Head:  Normocephalic and atraumatic. Neck: no JVD Lungs: Clear to auscultation and percussion. Heart: Normal S1 and S2.  3-4/6 SEM.  Irregularly irregular rhythm.   Abdomen:  Normal bowel sounds; soft; non tender; no organomegaly Pulses: Pulses normal in all 4 extremities. Extremities: No clubbing or cyanosis. No edema. Neurologic: Alert and oriented x  3.  EKG:  Atrial fibrillation, otherwise unremarkable tracing.  Rate is controlled.   ASSESSMENT AND PLAN:

## 2011-09-13 DIAGNOSIS — G459 Transient cerebral ischemic attack, unspecified: Secondary | ICD-10-CM | POA: Insufficient documentation

## 2011-09-13 NOTE — Assessment & Plan Note (Signed)
No symptoms.  No intervention for this at present.

## 2011-09-13 NOTE — Assessment & Plan Note (Signed)
Remains on warfarin. ?

## 2011-09-13 NOTE — Assessment & Plan Note (Signed)
Controlled at present.  

## 2011-09-13 NOTE — Assessment & Plan Note (Signed)
Stable at present.  Rate is controlled, and he remains on warfarin.

## 2011-09-13 NOTE — Assessment & Plan Note (Signed)
This shows about the same by echo, and is consistent with severe AS.  He is at present asymptomatic.  We have considered him for the valve clinic, and he has seen Dr. Samule Ohm in [place].  His iliacs are ok by CT for the [facility] device, but his root is too large.  We discussed again today in detail.  He would be a candidate, but in light of his recent TIA, I would be particularly worried about TVR.  It will not improve his current quality of life, and he has not had admission for CHF.  The main reason for intervention at present would be to prevent SCD, but I think the stroke risks are substantial, and he really is not a good surgical candidate, and everybody, in particular his family, agrees with that.  I will monitor him closely and see him back and we may get him into our new valve clinic for monitoring.  fd

## 2011-11-11 ENCOUNTER — Encounter: Payer: Self-pay | Admitting: Cardiology

## 2011-11-11 ENCOUNTER — Ambulatory Visit (INDEPENDENT_AMBULATORY_CARE_PROVIDER_SITE_OTHER): Payer: Medicare Other | Admitting: Cardiology

## 2011-11-11 VITALS — BP 130/80 | HR 74 | Ht 72.0 in | Wt 276.0 lb

## 2011-11-11 DIAGNOSIS — E7800 Pure hypercholesterolemia, unspecified: Secondary | ICD-10-CM

## 2011-11-11 DIAGNOSIS — I2541 Coronary artery aneurysm: Secondary | ICD-10-CM

## 2011-11-11 DIAGNOSIS — I4891 Unspecified atrial fibrillation: Secondary | ICD-10-CM

## 2011-11-11 DIAGNOSIS — I359 Nonrheumatic aortic valve disorder, unspecified: Secondary | ICD-10-CM

## 2011-11-11 DIAGNOSIS — I10 Essential (primary) hypertension: Secondary | ICD-10-CM

## 2011-11-11 DIAGNOSIS — R209 Unspecified disturbances of skin sensation: Secondary | ICD-10-CM

## 2011-11-11 DIAGNOSIS — R200 Anesthesia of skin: Secondary | ICD-10-CM

## 2011-11-11 DIAGNOSIS — I2699 Other pulmonary embolism without acute cor pulmonale: Secondary | ICD-10-CM

## 2011-11-11 NOTE — Progress Notes (Signed)
HPI:  [name] is in for follow up.  His wife is in the hospital after MVR and has had a tough course.  He has had several spells where he feels like his left side is numb.  And he feels dizzy.  This can last for any period of time.  It happened on Saturday and his face was also numb, although speech was not slurred, nor was there a change.  He is getting enough sleep.  He is on both ASA and warfarin.    Current Outpatient Prescriptions  Medication Sig Dispense Refill  . acetaminophen (ARTHRITIS PAIN RELIEF) 650 MG CR tablet Take 650 mg by mouth. Take 2 tabs AM and 1 tab PM       . allopurinol (ZYLOPRIM) 300 MG tablet Take 300 mg by mouth daily.        . captopril (CAPOTEN) 25 MG tablet Take 25 mg by mouth 2 (two) times daily. As directed        . Cyanocobalamin (VITAMIN B-12 IJ) Inject as directed every 30 (thirty) days.      . furosemide (LASIX) 80 MG tablet Take 80 mg by mouth daily.        Marland Kitchen levothyroxine (SYNTHROID) 175 MCG tablet Take 175 mcg by mouth daily.        Marland Kitchen LORazepam (ATIVAN) 1 MG tablet Take 1 mg by mouth as needed.        . metoprolol (TOPROL XL) 50 MG 24 hr tablet Take 50 mg by mouth daily.        . Misc Natural Products (OSTEO BI-FLEX ADV DOUBLE ST PO) Take by mouth. Take 2 tabs daily       . Omega-3 Fatty Acids (FISH OIL) 1000 MG CAPS Take by mouth. Take 1 capsule by mouth two times a day       . Omeprazole 20 MG TBEC Take by mouth. 1 tablet once a day       . paliperidone (INVEGA) 3 MG 24 hr tablet Take 3 mg by mouth daily.        . pravastatin (PRAVACHOL) 10 MG tablet Take 10 mg by mouth daily. Take 2 tabs daily       . sucralfate (CARAFATE) 1 G tablet Take 1 g by mouth 2 (two) times daily.        Marland Kitchen warfarin (COUMADIN) 5 MG tablet Take 5 mg by mouth as directed.          Allergies  Allergen Reactions  . Nifedipine     Past Medical History  Diagnosis Date  . Pulmonary embolus     bilateral  . Diabetes mellitus     type 2  . Hypothyroid   . Hypertension   .  CAD (coronary artery disease)   . Chronic atrial fibrillation     INR therpeutic on Coumadin  . Dementia     hallucinations status post psychiatric evaluation during admission  . Kidney cyst, acquired   . A-fib   . CAD (coronary artery disease)     cath 2006, no stents, ejection fraction  . Leukocytosis     Past Surgical History  Procedure Date  . Mole removakl     Family History  Problem Relation Age of Onset  . Heart failure Mother   . Microcephaly Father     History   Social History  . Marital Status: Married    Spouse Name: N/A    Number of Children: N/A  . Years of  Education: N/A   Occupational History  . Not on file.   Social History Main Topics  . Smoking status: Former Games developer  . Smokeless tobacco: Not on file  . Alcohol Use: No  . Drug Use: No  . Sexually Active: Not on file   Other Topics Concern  . Not on file   Social History Narrative   Has not smoked in 60 years    ROS: Please see the HPI.  All other systems reviewed and negative.  PHYSICAL EXAM:  BP 130/80  Pulse 74  Ht 6' (1.829 m)  Wt 276 lb (125.193 kg)  BMI 37.43 kg/m2  General: Well developed, well nourished, in no acute distress. Head:  Normocephalic and atraumatic. Neck: no JVD.  ? Soft carotid bruit.   Lungs: Clear to auscultation and percussion. Heart: Normal S1.  Loud SEM with single S2.   Irregular rhythm.   Abdomen:  Normal bowel sounds; soft; non tender; no organomegaly Extremities: No clubbing or cyanosis. No edema. Neurologic: Alert and oriented x 3.  Speech slow but normal for him.  Sits in wheelchair.  No obvious drift.  No definite focal weakness, nor difference laterally in tactile sense.    EKG:  Atrial fibrillation.  iLBBB vs LVH.    ASSESSMENT AND PLAN:

## 2011-11-11 NOTE — Patient Instructions (Signed)
Your physician recommends that you schedule a follow-up appointment in: 3 MONTHS  Your physician recommends that you continue on your current medications as directed. Please refer to the Current Medication list given to you today.   

## 2011-11-14 DIAGNOSIS — R200 Anesthesia of skin: Secondary | ICD-10-CM | POA: Insufficient documentation

## 2011-11-14 NOTE — Assessment & Plan Note (Signed)
Has severe AS, but is not a good candidate even for TVR.  Unable to consider Edwards valve in [place] due to root size.  Fortunately, he continues to be able to walk about a thousand feet without change in symptoms.  He would be a difficult candidate for TAVR, although Dr. Samule Ohm did reveal that his peripheral vessels were adequate for this.  This will be kept in mind as an option.

## 2011-11-14 NOTE — Assessment & Plan Note (Signed)
It is controlled and he is on anticoagulation, with therapeutic INR.  Also on ASA

## 2011-11-14 NOTE — Assessment & Plan Note (Signed)
On low dose statins.

## 2011-11-14 NOTE — Assessment & Plan Note (Signed)
See records in overview.  Nothing which would point to obvious cause.  No high grade lesions on doppler assessment, and CT as noted.  ASA was added and he is on all of this.  We can arrange for a neuro visit with [facility], however, there is no current obvious source unless some sort of bleeding.  Can do noncontrast CT to exclude, and arrange neuro visit.

## 2011-11-14 NOTE — Assessment & Plan Note (Signed)
See overview details.

## 2011-11-14 NOTE — Assessment & Plan Note (Signed)
On chronic warfarin 

## 2011-11-14 NOTE — Assessment & Plan Note (Signed)
Controlled.  

## 2011-11-17 ENCOUNTER — Telehealth: Payer: Self-pay

## 2011-11-17 DIAGNOSIS — R42 Dizziness and giddiness: Secondary | ICD-10-CM

## 2011-11-17 DIAGNOSIS — R200 Anesthesia of skin: Secondary | ICD-10-CM

## 2011-11-17 NOTE — Telephone Encounter (Signed)
Can you arrange for an OP non conrast CT head here, and also a [facility] neuro visit. Thanks. TS

## 2011-11-24 ENCOUNTER — Encounter: Payer: Self-pay | Admitting: Neurology

## 2011-11-24 NOTE — Telephone Encounter (Signed)
Pt scheduled to see [facility] on 01/28/12.

## 2011-11-24 NOTE — Telephone Encounter (Signed)
I spoke with the pt's son Anurag Scarfo [phone]).  Pt scheduled for CT of head on 11/26/11 at 2:00.  [facility] is working on arranging appointment with [facility].

## 2011-11-26 ENCOUNTER — Ambulatory Visit (INDEPENDENT_AMBULATORY_CARE_PROVIDER_SITE_OTHER)
Admission: RE | Admit: 2011-11-26 | Discharge: 2011-11-26 | Disposition: A | Discharge status: Home / Self Care | Payer: Medicare Other | Source: Ambulatory Visit | Attending: Cardiology | Admitting: Cardiology

## 2011-11-26 DIAGNOSIS — R209 Unspecified disturbances of skin sensation: Secondary | ICD-10-CM

## 2011-11-26 DIAGNOSIS — R200 Anesthesia of skin: Secondary | ICD-10-CM

## 2011-11-26 DIAGNOSIS — R42 Dizziness and giddiness: Secondary | ICD-10-CM

## 2012-01-21 ENCOUNTER — Emergency Department (HOSPITAL_COMMUNITY): Payer: Medicare Other

## 2012-01-21 ENCOUNTER — Telehealth: Payer: Self-pay | Admitting: Physician Assistant

## 2012-01-21 ENCOUNTER — Observation Stay (HOSPITAL_COMMUNITY)
Admission: EM | Admit: 2012-01-21 | Discharge: 2012-01-22 | Disposition: A | Discharge status: Home / Self Care | Payer: Medicare Other | Attending: Family Medicine | Admitting: Family Medicine

## 2012-01-21 ENCOUNTER — Encounter (HOSPITAL_COMMUNITY): Payer: Self-pay | Admitting: *Deleted

## 2012-01-21 DIAGNOSIS — Z86711 Personal history of pulmonary embolism: Secondary | ICD-10-CM | POA: Diagnosis present

## 2012-01-21 DIAGNOSIS — I2699 Other pulmonary embolism without acute cor pulmonale: Secondary | ICD-10-CM

## 2012-01-21 DIAGNOSIS — I359 Nonrheumatic aortic valve disorder, unspecified: Secondary | ICD-10-CM

## 2012-01-21 DIAGNOSIS — G459 Transient cerebral ischemic attack, unspecified: Secondary | ICD-10-CM

## 2012-01-21 DIAGNOSIS — I808 Phlebitis and thrombophlebitis of other sites: Secondary | ICD-10-CM | POA: Insufficient documentation

## 2012-01-21 DIAGNOSIS — I82409 Acute embolism and thrombosis of unspecified deep veins of unspecified lower extremity: Secondary | ICD-10-CM

## 2012-01-21 DIAGNOSIS — I10 Essential (primary) hypertension: Secondary | ICD-10-CM

## 2012-01-21 DIAGNOSIS — J438 Other emphysema: Secondary | ICD-10-CM | POA: Insufficient documentation

## 2012-01-21 DIAGNOSIS — I4891 Unspecified atrial fibrillation: Secondary | ICD-10-CM

## 2012-01-21 DIAGNOSIS — I2541 Coronary artery aneurysm: Secondary | ICD-10-CM

## 2012-01-21 DIAGNOSIS — R200 Anesthesia of skin: Secondary | ICD-10-CM

## 2012-01-21 DIAGNOSIS — I2510 Atherosclerotic heart disease of native coronary artery without angina pectoris: Secondary | ICD-10-CM | POA: Insufficient documentation

## 2012-01-21 DIAGNOSIS — R0602 Shortness of breath: Secondary | ICD-10-CM | POA: Insufficient documentation

## 2012-01-21 DIAGNOSIS — E7800 Pure hypercholesterolemia, unspecified: Secondary | ICD-10-CM

## 2012-01-21 DIAGNOSIS — I350 Nonrheumatic aortic (valve) stenosis: Secondary | ICD-10-CM

## 2012-01-21 DIAGNOSIS — R55 Syncope and collapse: Principal | ICD-10-CM

## 2012-01-21 DIAGNOSIS — R609 Edema, unspecified: Secondary | ICD-10-CM | POA: Insufficient documentation

## 2012-01-21 DIAGNOSIS — R42 Dizziness and giddiness: Secondary | ICD-10-CM | POA: Insufficient documentation

## 2012-01-21 DIAGNOSIS — G20A1 Parkinson's disease without dyskinesia, without mention of fluctuations: Secondary | ICD-10-CM | POA: Insufficient documentation

## 2012-01-21 DIAGNOSIS — Z7901 Long term (current) use of anticoagulants: Secondary | ICD-10-CM | POA: Insufficient documentation

## 2012-01-21 DIAGNOSIS — G20 Parkinson's disease: Secondary | ICD-10-CM | POA: Insufficient documentation

## 2012-01-21 DIAGNOSIS — Z7982 Long term (current) use of aspirin: Secondary | ICD-10-CM | POA: Insufficient documentation

## 2012-01-21 HISTORY — DX: Parkinson's disease without dyskinesia, without mention of fluctuations: G20.A1

## 2012-01-21 HISTORY — DX: Tremor, unspecified: R25.1

## 2012-01-21 HISTORY — DX: Parkinson's disease: G20

## 2012-01-21 LAB — BASIC METABOLIC PANEL
CO2: 29 mEq/L (ref 19–32)
Calcium: 9.1 mg/dL (ref 8.4–10.5)
Chloride: 103 mEq/L (ref 96–112)
Creatinine, Ser: 1.18 mg/dL (ref 0.50–1.35)
GFR calc Af Amer: 65 mL/min — ABNORMAL LOW (ref >90)
Sodium: 143 mEq/L (ref 135–145)

## 2012-01-21 LAB — CBC
HCT: 37.2 % — ABNORMAL LOW (ref 39.0–52.0)
Hemoglobin: 12.5 g/dL — ABNORMAL LOW (ref 13.0–17.0)
MCH: 33.8 pg (ref 26.0–34.0)
MCHC: 33.6 g/dL (ref 30.0–36.0)
MCV: 100 fL (ref 78.0–100.0)
Platelets: 148 10*3/uL — ABNORMAL LOW (ref 150–400)
RBC: 3.73 MIL/uL — ABNORMAL LOW (ref 4.22–5.81)
RDW: 14.1 % (ref 11.5–15.5)
WBC: 5.7 10*3/uL (ref 4.0–10.5)

## 2012-01-21 LAB — URINALYSIS, ROUTINE W REFLEX MICROSCOPIC
Glucose, UA: NEGATIVE mg/dL
Leukocytes, UA: NEGATIVE
Protein, ur: NEGATIVE mg/dL
pH: 5.5 (ref 5.0–8.0)

## 2012-01-21 LAB — DIFFERENTIAL
Basophils Relative: 1 % (ref 0–1)
Eosinophils Absolute: 0.5 10*3/uL (ref 0.0–0.7)
Monocytes Absolute: 0.7 10*3/uL (ref 0.1–1.0)
Monocytes Relative: 12 % (ref 3–12)

## 2012-01-21 LAB — COMPREHENSIVE METABOLIC PANEL
Albumin: 3.4 g/dL — ABNORMAL LOW (ref 3.5–5.2)
BUN: 22 mg/dL (ref 6–23)
Calcium: 9 mg/dL (ref 8.4–10.5)
Creatinine, Ser: 1.17 mg/dL (ref 0.50–1.35)
Total Protein: 6.7 g/dL (ref 6.0–8.3)

## 2012-01-21 LAB — PROTIME-INR: INR: 1.93 — ABNORMAL HIGH (ref 0.00–1.49)

## 2012-01-21 LAB — POCT I-STAT TROPONIN I: Troponin i, poc: 0.02 ng/mL (ref 0.00–0.08)

## 2012-01-21 MED ORDER — SODIUM CHLORIDE 0.9 % IV SOLN
INTRAVENOUS | Status: DC
  Administered 2012-01-21: 20:00:00 via INTRAVENOUS

## 2012-01-21 NOTE — ED Notes (Signed)
Snacks and drinks offered to pt and family.

## 2012-01-21 NOTE — Telephone Encounter (Signed)
Patient's daughter called regarding father feeling weak and lightheaded with presyncope. He is in the emergency department here at [facility] for further evaluation. 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 A noncontrast head CT was ordered after his last office visit with Dr. [name] revealing small hypodensity in left occipital lobe consistent with potentially chronic infarction. Clinical correlation was suggested. An outpatient neuro referral was set to be arranged, however there is no record of this. Will inform Dr. [name] as he is cathing currently. Further evaluation and management per the ED physicians for today's incident. We will be available is cardiology is consulted. She understood and agreed.    Jacqulyn Bath, PA-C 01/21/2012 5:55 PM

## 2012-01-21 NOTE — ED Notes (Signed)
Patient reports onset of dizziness today and near syncope.  Patient also complains of chest pain.  Patient had been here today visiting his wife.

## 2012-01-21 NOTE — ED Provider Notes (Cosign Needed)
History     CSN: [id number]  Arrival date & time 01/21/12  1735   First MD Initiated Contact with Patient 01/21/12 1928      Chief Complaint  Patient presents with  . Chest Pain  . Dizziness  . Near Syncope    (Consider location/radiation/quality/duration/timing/severity/associated sxs/prior treatment) HPI Comments: The patient is an 78 year old man who was visiting his wife in [facility]. He has known aortic stenosis and atrial fibrillation. He was walking down a long arm towards a quarter, and suddenly fainted. He was therefore brought to the ED for evaluation. Dr. [name], his cardiologist, who accompanied him to the ED. He says the patient's aortic stenosis is slowly getting worse. He is not a candidate for open aortic valve replacement, and apparently is not a candidate for endovascular aortic valve replacement either.  Patient is a 78 y.o. male presenting with syncope. The history is provided by the patient and medical records (History largely obtained from Shawnie Pons, M.D, who knows pt well. ). No language interpreter was used.  Loss of Consciousness This is a new problem. The current episode started 1 to 2 hours ago. Episode frequency: He was unconscious for approximately 5 minutes. The problem has been rapidly improving. Chest pain: he had chest pain shortly before fainting. The symptoms are aggravated by walking. He has tried nothing for the symptoms.    Past Medical History  Diagnosis Date  . Pulmonary embolus     bilateral  . Hypothyroid   . Hypertension   . CAD (coronary artery disease)   . Chronic atrial fibrillation     INR therpeutic on Coumadin  . Dementia     hallucinations status post psychiatric evaluation during admission  . Kidney cyst, acquired   . A-fib   . CAD (coronary artery disease)     cath 2006, no stents, ejection fraction  . Leukocytosis   . Tremor   . Parkinson disease     Past Surgical History  Procedure Date  . Mole  removakl     Family History  Problem Relation Age of Onset  . Heart failure Mother   . Microcephaly Father     History  Substance Use Topics  . Smoking status: Former Games developer  . Smokeless tobacco: Not on file  . Alcohol Use: No      Review of Systems  Constitutional: Negative.   HENT: Negative.   Eyes: Negative.   Respiratory: Negative.   Cardiovascular: Positive for syncope. Chest pain: he had chest pain shortly before fainting.  Gastrointestinal: Negative.   Genitourinary: Negative.   Musculoskeletal: Negative.   Skin: Negative.   Neurological: Positive for syncope.  Psychiatric/Behavioral: Negative.     Allergies  Nifedipine  Home Medications   Current Outpatient Rx  Name Route Sig Dispense Refill  . ACETAMINOPHEN ER 650 MG PO TBCR Oral Take 650 mg by mouth. Take 2 tabs AM and 1 tab PM     . ALLOPURINOL 300 MG PO TABS Oral Take 300 mg by mouth daily.      . ASPIRIN 81 MG PO CHEW Oral Chew 81 mg by mouth daily.    Marland Kitchen CAPTOPRIL 25 MG PO TABS Oral Take 25 mg by mouth 2 (two) times daily. As directed      . VITAMIN B-12 IJ Injection Inject as directed every 30 (thirty) days.    . FUROSEMIDE 80 MG PO TABS Oral Take 80 mg by mouth daily.      Marland Kitchen LEVOTHYROXINE SODIUM 175  MCG PO TABS Oral Take 175 mcg by mouth daily.      Marland Kitchen LORAZEPAM 1 MG PO TABS Oral Take 1 mg by mouth as needed. For anxiety    . METOPROLOL SUCCINATE ER 50 MG PO TB24 Oral Take 50 mg by mouth daily.      . OSTEO BI-FLEX ADV DOUBLE ST PO Oral Take by mouth. Take 2 tabs daily     . FISH OIL 1000 MG PO CAPS Oral Take by mouth. Take 1 capsule by mouth two times a day     . OMEPRAZOLE 20 MG PO TBEC Oral Take by mouth. 1 tablet once a day     . PALIPERIDONE ER 3 MG PO TB24 Oral Take 3 mg by mouth daily.      Marland Kitchen PRAVASTATIN SODIUM 10 MG PO TABS Oral Take 10 mg by mouth daily. Take 2 tabs daily     . SUCRALFATE 1 G PO TABS Oral Take 1 g by mouth 2 (two) times daily.      . WARFARIN SODIUM 5 MG PO TABS Oral Take  5 mg by mouth at bedtime.       BP 143/85  Pulse 98  Temp 97.6 F (36.4 C) (Oral)  Resp 22  Ht 6' (1.829 m)  Wt 275 lb (124.739 kg)  BMI 37.30 kg/m2  SpO2 100%  Physical Exam  Nursing note and vitals reviewed. Constitutional: He is oriented to person, place, and time.       Frail-appearing elderly man, breathing oxygen.  HENT:  Head: Normocephalic and atraumatic.  Right Ear: External ear normal.  Left Ear: External ear normal.  Mouth/Throat: Oropharynx is clear and moist.  Eyes: EOM are normal. Pupils are equal, round, and reactive to light.  Neck: Normal range of motion. Neck supple.       No carotid bruit.  Cardiovascular: Normal rate.        He regular rhythm. Harsh systolic ejection murmur heard best over the aortic area and transmitted towards his neck.  Pulmonary/Chest: Effort normal and breath sounds normal.  Abdominal: Soft. Bowel sounds are normal.  Musculoskeletal: Normal range of motion.       3+ bilateral pitting ankle edema.  Neurological: He is alert and oriented to person, place, and time.       No sensory or motor deficit.  Skin: Skin is warm and dry.  Psychiatric: He has a normal mood and affect. His behavior is normal.    ED Course  Procedures (including critical care time)   Labs Reviewed  POCT I-STAT TROPONIN I  CBC  BASIC METABOLIC PANEL   Dg Chest 2 View  01/21/2012  *RADIOLOGY REPORT*  Clinical Data: Chest pain and near-syncope.  CHEST - 2 VIEW  Comparison: Portable chest 08/21/2011.  Findings: The heart is mildly enlarged.  The lungs are clear. Emphysematous changes are noted.  Degenerative changes are evident in the thoracic spine with fusion of anterior osteophytes across multiple levels.  IMPRESSION:  1. Mild cardiomegaly without failure. 2.  Emphysema. 3.  No acute cardiopulmonary disease.  Original Report Authenticated By: Jamesetta Orleans. MATTERN, M.D.   7:29 PM  Date: 01/21/2012  Rate: 98  Rhythm: atrial fibrillation  QRS Axis: left   Intervals: normal QRS:  Poor R wave progression in precordial leads suggests old anterior myocardial infarction.  ST/T Wave abnormalities: normal  Conduction Disutrbances:none  Narrative Interpretation: Abnormal EKG  Old EKG Reviewed: unchanged  8:13 PM Patient was seen and had physical exam. Laboratory  tests were ordered. EKG showed atrial fibrillation. Chest x-ray showed mild cardiomegaly megaly and also emphysema.  11:09 PM Results for orders placed during the hospital encounter of 01/21/12  CBC      Component Value Range   WBC 5.7  4.0 - 10.5 K/uL   RBC 3.73 (*) 4.22 - 5.81 MIL/uL   Hemoglobin 12.5 (*) 13.0 - 17.0 g/dL   HCT 16.1 (*) 09.6 - 04.5 %   MCV 100.0  78.0 - 100.0 fL   MCH 33.5  26.0 - 34.0 pg   MCHC 33.5  30.0 - 36.0 g/dL   RDW 40.9  81.1 - 91.4 %   Platelets 148 (*) 150 - 400 K/uL  BASIC METABOLIC PANEL      Component Value Range   Sodium 143  135 - 145 mEq/L   Potassium 3.7  3.5 - 5.1 mEq/L   Chloride 103  96 - 112 mEq/L   CO2 29  19 - 32 mEq/L   Glucose, Bld 116 (*) 70 - 99 mg/dL   BUN 21  6 - 23 mg/dL   Creatinine, Ser 7.82  0.50 - 1.35 mg/dL   Calcium 9.1  8.4 - 95.6 mg/dL   GFR calc non Af Amer 56 (*) >90 mL/min   GFR calc Af Amer 65 (*) >90 mL/min  POCT I-STAT TROPONIN I      Component Value Range   Troponin i, poc 0.02  0.00 - 0.08 ng/mL   Comment 3           CBC      Component Value Range   WBC 5.7  4.0 - 10.5 K/uL   RBC 3.70 (*) 4.22 - 5.81 MIL/uL   Hemoglobin 12.5 (*) 13.0 - 17.0 g/dL   HCT 21.3 (*) 08.6 - 57.8 %   MCV 100.5 (*) 78.0 - 100.0 fL   MCH 33.8  26.0 - 34.0 pg   MCHC 33.6  30.0 - 36.0 g/dL   RDW 46.9  62.9 - 52.8 %   Platelets 152  150 - 400 K/uL  DIFFERENTIAL      Component Value Range   Neutrophils Relative 51  43 - 77 %   Neutro Abs 2.9  1.7 - 7.7 K/uL   Lymphocytes Relative 27  12 - 46 %   Lymphs Abs 1.5  0.7 - 4.0 K/uL   Monocytes Relative 12  3 - 12 %   Monocytes Absolute 0.7  0.1 - 1.0 K/uL   Eosinophils Relative 9  (*) 0 - 5 %   Eosinophils Absolute 0.5  0.0 - 0.7 K/uL   Basophils Relative 1  0 - 1 %   Basophils Absolute 0.1  0.0 - 0.1 K/uL  COMPREHENSIVE METABOLIC PANEL      Component Value Range   Sodium 145  135 - 145 mEq/L   Potassium 3.6  3.5 - 5.1 mEq/L   Chloride 105  96 - 112 mEq/L   CO2 29  19 - 32 mEq/L   Glucose, Bld 113 (*) 70 - 99 mg/dL   BUN 22  6 - 23 mg/dL   Creatinine, Ser 4.13  0.50 - 1.35 mg/dL   Calcium 9.0  8.4 - 24.4 mg/dL   Total Protein 6.7  6.0 - 8.3 g/dL   Albumin 3.4 (*) 3.5 - 5.2 g/dL   AST 15  0 - 37 U/L   ALT 9  0 - 53 U/L   Alkaline Phosphatase 85  39 - 117 U/L  Total Bilirubin 0.4  0.3 - 1.2 mg/dL   GFR calc non Af Amer 57 (*) >90 mL/min   GFR calc Af Amer 66 (*) >90 mL/min  URINALYSIS, ROUTINE W REFLEX MICROSCOPIC      Component Value Range   Color, Urine YELLOW  YELLOW   APPearance CLEAR  CLEAR   Specific Gravity, Urine 1.013  1.005 - 1.030   pH 5.5  5.0 - 8.0   Glucose, UA NEGATIVE  NEGATIVE mg/dL   Hgb urine dipstick NEGATIVE  NEGATIVE   Bilirubin Urine NEGATIVE  NEGATIVE   Ketones, ur NEGATIVE  NEGATIVE mg/dL   Protein, ur NEGATIVE  NEGATIVE mg/dL   Urobilinogen, UA 1.0  0.0 - 1.0 mg/dL   Nitrite NEGATIVE  NEGATIVE   Leukocytes, UA NEGATIVE  NEGATIVE  PROTIME-INR      Component Value Range   Prothrombin Time 22.4 (*) 11.6 - 15.2 seconds   INR 1.93 (*) 0.00 - 1.49  APTT      Component Value Range   aPTT 48 (*) 24 - 37 seconds  D-DIMER, QUANTITATIVE      Component Value Range   D-Dimer, Quant 0.27  0.00 - 0.48 ug/mL-FEU   Dg Chest 2 View  01/21/2012  *RADIOLOGY REPORT*  Clinical Data: Chest pain and near-syncope.  CHEST - 2 VIEW  Comparison: Portable chest 08/21/2011.  Findings: The heart is mildly enlarged.  The lungs are clear. Emphysematous changes are noted.  Degenerative changes are evident in the thoracic spine with fusion of anterior osteophytes across multiple levels.  IMPRESSION:  1. Mild cardiomegaly without failure. 2.  Emphysema. 3.   No acute cardiopulmonary disease.  Original Report Authenticated By: Jamesetta Orleans. MATTERN, M.D.   Review of his lab tests shows mild anemia, normal TNI and D-dimer, CXR shows cardiomegaly and emphysema.  Advised pt he should be admitted for observation after syncope, which was probably related to his aortic stenosis.  11:44 PM Case discussed with Della Goo, M.D.   Admit to observation status to [facility], Team 3, to a telemetry bed, Dr. Olegario Messier.   1. Syncope   2. Aortic stenosis           Carleene Cooper III, MD 01/22/12 0140

## 2012-01-21 NOTE — ED Notes (Signed)
Assisted pt to bathroom voided without any problem

## 2012-01-21 NOTE — ED Notes (Signed)
assisted up to the bathroom voided without any problem

## 2012-01-22 ENCOUNTER — Encounter (HOSPITAL_COMMUNITY): Payer: Self-pay | Admitting: Internal Medicine

## 2012-01-22 DIAGNOSIS — R55 Syncope and collapse: Secondary | ICD-10-CM

## 2012-01-22 DIAGNOSIS — I359 Nonrheumatic aortic valve disorder, unspecified: Secondary | ICD-10-CM

## 2012-01-22 DIAGNOSIS — I10 Essential (primary) hypertension: Secondary | ICD-10-CM

## 2012-01-22 DIAGNOSIS — I4891 Unspecified atrial fibrillation: Secondary | ICD-10-CM

## 2012-01-22 LAB — URINE CULTURE
Colony Count: NO GROWTH
Culture  Setup Time: 201306210132

## 2012-01-22 LAB — BASIC METABOLIC PANEL
BUN: 18 mg/dL (ref 6–23)
Chloride: 106 mEq/L (ref 96–112)
Creatinine, Ser: 0.96 mg/dL (ref 0.50–1.35)
GFR calc Af Amer: 88 mL/min — ABNORMAL LOW (ref >90)
GFR calc non Af Amer: 76 mL/min — ABNORMAL LOW (ref >90)

## 2012-01-22 LAB — CARDIAC PANEL(CRET KIN+CKTOT+MB+TROPI)
CK, MB: 1.6 ng/mL (ref 0.3–4.0)
Relative Index: INVALID (ref 0.0–2.5)
Total CK: 80 U/L (ref 7–232)
Troponin I: <0.3 ng/mL (ref <0.30)
Troponin I: <0.3 ng/mL (ref <0.30)

## 2012-01-22 LAB — VITAMIN B12: Vitamin B-12: 609 pg/mL (ref 211–911)

## 2012-01-22 LAB — RETICULOCYTES
RBC.: 3.41 MIL/uL — ABNORMAL LOW (ref 4.22–5.81)
Retic Count, Absolute: 47.7 10*3/uL (ref 19.0–186.0)

## 2012-01-22 LAB — FOLATE: Folate: 9.4 ng/mL

## 2012-01-22 LAB — CBC
HCT: 32.4 % — ABNORMAL LOW (ref 39.0–52.0)
MCHC: 34.3 g/dL (ref 30.0–36.0)
Platelets: 133 10*3/uL — ABNORMAL LOW (ref 150–400)
RDW: 13.9 % (ref 11.5–15.5)
WBC: 5.4 10*3/uL (ref 4.0–10.5)

## 2012-01-22 LAB — PROTIME-INR
INR: 2.15 — ABNORMAL HIGH (ref 0.00–1.49)
Prothrombin Time: 24.4 seconds — ABNORMAL HIGH (ref 11.6–15.2)

## 2012-01-22 LAB — IRON AND TIBC: Iron: 33 ug/dL — ABNORMAL LOW (ref 42–135)

## 2012-01-22 MED ORDER — POTASSIUM CHLORIDE CRYS ER 20 MEQ PO TBCR
40.0000 meq | EXTENDED_RELEASE_TABLET | Freq: Two times a day (BID) | ORAL | Status: AC
  Administered 2012-01-22: 40 meq via ORAL
  Filled 2012-01-22: qty 2

## 2012-01-22 MED ORDER — PANTOPRAZOLE SODIUM 40 MG PO TBEC
40.0000 mg | DELAYED_RELEASE_TABLET | Freq: Every day | ORAL | Status: DC
  Administered 2012-01-22: 40 mg via ORAL
  Filled 2012-01-22: qty 1

## 2012-01-22 MED ORDER — ALLOPURINOL 300 MG PO TABS
300.0000 mg | ORAL_TABLET | Freq: Every day | ORAL | Status: DC
  Administered 2012-01-22: 300 mg via ORAL
  Filled 2012-01-22: qty 1

## 2012-01-22 MED ORDER — METOPROLOL SUCCINATE ER 50 MG PO TB24
50.0000 mg | ORAL_TABLET | Freq: Every day | ORAL | Status: DC
  Administered 2012-01-22: 50 mg via ORAL
  Filled 2012-01-22: qty 1

## 2012-01-22 MED ORDER — FUROSEMIDE 80 MG PO TABS
80.0000 mg | ORAL_TABLET | Freq: Every day | ORAL | Status: DC
  Administered 2012-01-22: 80 mg via ORAL
  Filled 2012-01-22: qty 1

## 2012-01-22 MED ORDER — SUCRALFATE 1 G PO TABS
1.0000 g | ORAL_TABLET | Freq: Two times a day (BID) | ORAL | Status: DC
  Administered 2012-01-22: 1 g via ORAL
  Filled 2012-01-22 (×2): qty 1

## 2012-01-22 MED ORDER — SODIUM CHLORIDE 0.9 % IJ SOLN: 3.0000 mL | Freq: Two times a day (BID) | INTRAMUSCULAR | Status: DC

## 2012-01-22 MED ORDER — CAPTOPRIL 25 MG PO TABS
25.0000 mg | ORAL_TABLET | Freq: Two times a day (BID) | ORAL | Status: DC
  Administered 2012-01-22: 25 mg via ORAL
  Filled 2012-01-22 (×2): qty 1

## 2012-01-22 MED ORDER — SODIUM CHLORIDE 0.9 % IV SOLN
INTRAVENOUS | Status: DC
  Administered 2012-01-22: 04:00:00 via INTRAVENOUS

## 2012-01-22 MED ORDER — ACETAMINOPHEN 325 MG PO TABS: 650.0000 mg | ORAL_TABLET | Freq: Four times a day (QID) | ORAL | Status: DC | PRN

## 2012-01-22 MED ORDER — SODIUM CHLORIDE 0.9 % IJ SOLN: 3.0000 mL | INTRAMUSCULAR | Status: DC | PRN

## 2012-01-22 MED ORDER — OXYCODONE HCL 5 MG PO TABS: 5.0000 mg | ORAL_TABLET | ORAL | Status: DC | PRN

## 2012-01-22 MED ORDER — PALIPERIDONE ER 3 MG PO TB24
3.0000 mg | ORAL_TABLET | Freq: Every day | ORAL | Status: DC
  Administered 2012-01-22: 3 mg via ORAL
  Filled 2012-01-22: qty 1

## 2012-01-22 MED ORDER — ALUM & MAG HYDROXIDE-SIMETH 200-200-20 MG/5ML PO SUSP: 30.0000 mL | Freq: Four times a day (QID) | ORAL | Status: DC | PRN

## 2012-01-22 MED ORDER — WARFARIN SODIUM 7.5 MG PO TABS
7.5000 mg | ORAL_TABLET | Freq: Once | ORAL | Status: AC
  Administered 2012-01-22: 7.5 mg via ORAL
  Filled 2012-01-22: qty 1

## 2012-01-22 MED ORDER — ZOLPIDEM TARTRATE 5 MG PO TABS: 5.0000 mg | ORAL_TABLET | Freq: Every evening | ORAL | Status: DC | PRN

## 2012-01-22 MED ORDER — WARFARIN SODIUM 7.5 MG PO TABS
7.5000 mg | ORAL_TABLET | Freq: Once | ORAL | Status: DC
  Filled 2012-01-22: qty 1

## 2012-01-22 MED ORDER — ONDANSETRON HCL 4 MG PO TABS: 4.0000 mg | ORAL_TABLET | Freq: Four times a day (QID) | ORAL | Status: DC | PRN

## 2012-01-22 MED ORDER — ONDANSETRON HCL 4 MG/2ML IJ SOLN: 4.0000 mg | Freq: Three times a day (TID) | INTRAMUSCULAR | Status: DC | PRN

## 2012-01-22 MED ORDER — ONDANSETRON HCL 4 MG/2ML IJ SOLN: 4.0000 mg | Freq: Four times a day (QID) | INTRAMUSCULAR | Status: DC | PRN

## 2012-01-22 MED ORDER — ASPIRIN 81 MG PO CHEW
81.0000 mg | CHEWABLE_TABLET | Freq: Every day | ORAL | Status: DC
  Administered 2012-01-22: 81 mg via ORAL
  Filled 2012-01-22: qty 1

## 2012-01-22 MED ORDER — WARFARIN - PHARMACIST DOSING INPATIENT: Freq: Every day | Status: DC

## 2012-01-22 MED ORDER — SIMVASTATIN 5 MG PO TABS
5.0000 mg | ORAL_TABLET | Freq: Every day | ORAL | Status: DC
  Filled 2012-01-22: qty 1

## 2012-01-22 MED ORDER — LORAZEPAM 1 MG PO TABS: 1.0000 mg | ORAL_TABLET | Freq: Two times a day (BID) | ORAL | Status: DC | PRN

## 2012-01-22 MED ORDER — ACETAMINOPHEN 650 MG RE SUPP: 650.0000 mg | Freq: Four times a day (QID) | RECTAL | Status: DC | PRN

## 2012-01-22 MED ORDER — OMEGA-3-ACID ETHYL ESTERS 1 G PO CAPS
1.0000 g | ORAL_CAPSULE | Freq: Two times a day (BID) | ORAL | Status: DC
  Administered 2012-01-22: 1 g via ORAL
  Filled 2012-01-22 (×3): qty 1

## 2012-01-22 MED ORDER — LEVOTHYROXINE SODIUM 175 MCG PO TABS
175.0000 ug | ORAL_TABLET | Freq: Every day | ORAL | Status: DC
  Administered 2012-01-22: 175 ug via ORAL
  Filled 2012-01-22 (×2): qty 1

## 2012-01-22 MED ORDER — WARFARIN SODIUM 5 MG PO TABS: 5.0000 mg | ORAL_TABLET | Freq: Every day | ORAL | Status: DC

## 2012-01-22 MED ORDER — SODIUM CHLORIDE 0.9 % IV SOLN: 250.0000 mL | INTRAVENOUS | Status: DC | PRN

## 2012-01-22 NOTE — Progress Notes (Signed)
ANTICOAGULATION CONSULT NOTE - Initial Consult  Pharmacy Consult for Coumadin Indication: atrial fibrillation  Allergies  Allergen Reactions  . Morphine And Related     Goes Crazy  . Nifedipine Other (See Comments)    "makes me go crazy"  . Nitroglycerin     Patient Measurements: Height: 6' (182.9 cm) Weight: 275 lb (124.739 kg) IBW/kg (Calculated) : 77.6   Vital Signs: Temp: 97.6 F (36.4 C) (06/20 1756) Temp src: Oral (06/20 1756) BP: 133/81 mmHg (06/21 0100) Pulse Rate: 78  (06/21 0100)  Labs:  Basename 01/21/12 2019 01/21/12 1844  HGB 12.5* 12.5*  HCT 37.2* 37.3*  PLT 152 148*  APTT 48* --  LABPROT 22.4* --  INR 1.93* --  HEPARINUNFRC -- --  CREATININE 1.17 1.18  CKTOTAL -- --  CKMB -- --  TROPONINI -- --    Estimated Creatinine Clearance: 68.7 ml/min (by C-G formula based on Cr of 1.17).   Medical History: Past Medical History  Diagnosis Date  . Pulmonary embolus     bilateral  . Hypothyroid   . Hypertension   . CAD (coronary artery disease)   . Chronic atrial fibrillation     INR therpeutic on Coumadin  . Dementia     hallucinations status post psychiatric evaluation during admission  . Kidney cyst, acquired   . A-fib   . CAD (coronary artery disease)     cath 2006, no stents, ejection fraction  . Leukocytosis   . Tremor   . Parkinson disease     Assessment: 78yo male was visiting his wife in hospital when he experienced episode of syncope, known aortic stenosis, thought to be worsening but not candidate for valve replacement, to continue Coumadin for Afib; admitted with slightly subtherapeutic INR with missed dose on 6/20.  Goal of Therapy:  INR 2-3   Plan:  Will give boosted Coumadin dose of 7.5mg  x1 today and monitor INR for dose adjustments.  Colleen Can PharmD BCPS 01/22/2012,1:54 AM

## 2012-01-22 NOTE — Discharge Summary (Signed)
Physician Discharge Summary  Patient ID: [name] Graham MRN: [id number] DOB/AGE: April 02, 1929 78 y.o.  Admit date: 01/21/2012 Discharge date: 01/22/2012  Admission Diagnoses: Syncope and collapse Severe aortic stenosis Atrial fibrillation Chronic anticoagulation with warfarin and aspirin Coronary artery disease  Discharge Diagnoses:   *Syncope and collapse  HYPERCHOLESTEROLEMIA  IIA  HYPERTENSION, BENIGN  PULMONARY EMBOLISM  AORTIC STENOSIS/ INSUFFICIENCY, NON-RHEUMATIC  Atrial fibrillation  DEEP VENOUS THROMBOPHLEBITIS, CHRONIC  Coronary artery aneurysm  Discharged Condition: stable  Hospital Course: The patient was in the hospital visiting his wife when he reported that he walked a long distance to the elevators from the west side of the hospital to the central elevator area more than 1000 feet and at a faster pace than he normally walks with his granddaughter and he became short of breath and passed out.  He was sent to the emergency department where he was evaluated and seen by the emergency room physicians and his cardiologist.  The recommended that he be observed in the hospital.  He was reported that likely his syncopal event was related to the fact that he has severe aortic stenosis that is nonoperable.  The patient has been watched closely by his cardiologist for this.  The patient understands that he can only ambulate short distances without having to stop and rest.  He overexerted himself when he had this recent event.  His cardiac enzymes have been negative.  He was given some gentle IV fluids for hydration.  He tolerated this very well.  He was seen by physical therapy and ambulated.  He became very anxious and wanted to be discharged.  He has agreed to close follow up with his primary care provider and cardiologist.  Dr. Riley Graham saw him in the emergency department yesterday.  Patient's PT and INR remained therapeutic and stable.  He had very mild hypokalemia that was treated with  oral potassium.  I've recommended that he followup in the next 3-5 days and have his PT/INR retested in 3 days.  The patient verbalized understanding.  The patient was given instructions to not ambulate more than 1000 feet without stopping to sit down and rest.  Fall precautions recommended.  Patient has appointment with neurology next week.  This appointment was scheduled outpatient through his cardiologist.  Patient ambulated very well with physical therapy and there were no further PT needs identified or recommended at this time.  The patient will need medical followup as mentioned and detailed below.   Consults: cardiology, physical therapy  Discharge Vitals: Blood pressure 132/85, pulse 71, temperature 98.3 F (36.8 C), temperature source Oral, resp. rate 16, height 6' (1.829 m), weight 126.554 kg (279 lb), SpO2 100.00%. Pt sitting up in bed awake,alert, in no distress, he ambulated hallway with physical therapy, no further syncope HEENT - NCAT, MMM Neck - no JVD, No bruit, thyroid soft, no nodules Lungs - BBS clear  CV - normal s1, s2 loud systolic murmur  Abd - soft, nondistended, nontender Neuro - nonfocal  Disposition: Home with family  Discharge Orders    Future Appointments: Provider: Department: Dept Phone: Center:   01/28/2012 3:30 PM [name] Gain, MD [facility] [phone] None   02/11/2012 1:45 PM [name] Abraham, MD [facility] [phone] [facility]     Future Orders Please Complete By Expires   Increase activity slowly      Discharge instructions      Comments:   Please followup with your primary care physician and cardiologist in the next  3-5 days for hospital recheck. Please have your PT/INR tested in 3 days. Return if symptoms recur, worsen or new problems develop. Do Not ambulate more than 1000 feet without sitting down and stopping to rest Fall precautions recommended at home     Medication List  As of 01/22/2012  2:32 PM   TAKE these  medications         allopurinol 300 MG tablet   Commonly known as: ZYLOPRIM   Take 300 mg by mouth daily.      ARTHRITIS PAIN RELIEF 650 MG CR tablet   Generic drug: acetaminophen   Take 650 mg by mouth. Take 2 tabs AM and 1 tab PM      aspirin 81 MG chewable tablet   Chew 81 mg by mouth daily.      CAPOTEN 25 MG tablet   Generic drug: captopril   Take 25 mg by mouth 2 (two) times daily. As directed        COUMADIN 5 MG tablet   Generic drug: warfarin   Take 5 mg by mouth at bedtime.      Fish Oil 1000 MG Caps   Take by mouth. Take 1 capsule by mouth two times a day      INVEGA 3 MG 24 hr tablet   Generic drug: paliperidone   Take 3 mg by mouth daily.      LASIX 80 MG tablet   Generic drug: furosemide   Take 80 mg by mouth daily.      LORazepam 1 MG tablet   Commonly known as: ATIVAN   Take 1 mg by mouth as needed. For anxiety      Omeprazole 20 MG Tbec   Take by mouth. 1 tablet once a day      OSTEO BI-FLEX ADV DOUBLE ST PO   Take by mouth. Take 2 tabs daily      pravastatin 10 MG tablet   Commonly known as: PRAVACHOL   Take 10 mg by mouth daily. Take 2 tabs daily      sucralfate 1 G tablet   Commonly known as: CARAFATE   Take 1 g by mouth 2 (two) times daily.      SYNTHROID 175 MCG tablet   Generic drug: levothyroxine   Take 175 mcg by mouth daily.      TOPROL XL 50 MG 24 hr tablet   Generic drug: metoprolol succinate   Take 50 mg by mouth daily.      VITAMIN B-12 IJ   Inject as directed every 30 (thirty) days.           Follow-up Information    Follow up with [name] Fickle, MD. Schedule an appointment as soon as possible for a visit in 3 days. (hospital follow up check PT/INR)    Contact information:   [address] [place] [address] [phone]       Follow up with [name] Pons, MD. Schedule an appointment as soon as possible for a visit in 1 week. (hospital follow up)    Contact information:   [name] [place] [address] [phone]         I spent 37 minutes preparing this patient's discharge, reviewing medical records, consultation notes, chart reviewed and reconciling medications.  Signed: Alister Staver 01/22/2012, 2:32 PM [phone]

## 2012-01-22 NOTE — Progress Notes (Signed)
Pt. Discharged 01/22/2012  3:36 PM Discharge instructions reviewed with patient/family. Patient/family verbalized understanding. All Rx's given. Questions answered as needed. Pt. Discharged to home with family/self.  Sharolyn Douglas

## 2012-01-22 NOTE — Evaluation (Signed)
Physical Therapy Evaluation Patient Details Name: [name] Graham MRN: [id number] DOB: 1929-06-29 Today's Date: 01/22/2012 Time: 4782-9562 PT Time Calculation (min): 19 min  PT Assessment / Plan / Recommendation Clinical Impression  Pt very familiar due to working with his spouse. Pt visiting spouse in hospital and was walking too fast with aortic stenosis and had a syncopal episode. Pt currently back to baseline and able to ambulate hall and perform transfers without physical assist. Pt encouraged to keep speed slow and continue to use transport chair for long distances. Dgtr present throughout and agreeable to no further needs and that family able to assist.     PT Assessment  Patent does not need any further PT services    Follow Up Recommendations  No PT follow up    Barriers to Discharge        lEquipment Recommendations  None recommended by PT    Recommendations for Other Services     Frequency      Precautions / Restrictions Precautions Precautions: None   Pertinent Vitals/Pain sats 92% on RA, HR 89 No dizziness or pain      Mobility  Bed Mobility Bed Mobility: Not assessed Transfers Transfers: Sit to Stand;Stand to Sit Sit to Stand: 6: Modified independent (Device/Increase time);From chair/3-in-1;With armrests Stand to Sit: 6: Modified independent (Device/Increase time);To chair/3-in-1;With armrests Ambulation/Gait Ambulation/Gait Assistance: 5: Supervision Ambulation Distance (Feet): 150 Feet Assistive device: Straight cane Ambulation/Gait Assistance Details: supervision for safety Gait Pattern: Step-through pattern;Decreased stride length Stairs: No    Exercises     PT Diagnosis:    PT Problem List:   PT Treatment Interventions:     PT Goals    Visit Information  Last PT Received On: 01/22/12 Assistance Needed: +1    Subjective Data  Subjective: I can walk fine I just went too fast Patient Stated Goal: get out of here today   Prior  Functioning  Home Living Lives With: Family;Other (Comment) (wife in hospital and family staying 24/7) Available Help at Discharge: Family Type of Home: House Home Access: Ramped entrance Home Layout: One level Bathroom Shower/Tub: Engineer, manufacturing systems: Handicapped height Home Adaptive Equipment: Straight cane;Walker - rolling;Wheelchair - manual;Wheelchair - powered;Tub transfer bench Prior Function Level of Independence: Needs assistance Needs Assistance: Light Housekeeping;Meal Prep;Bathing Bath: Moderate Meal Prep: Total Light Housekeeping: Total Able to Take Stairs?: No Driving: No Vocation: Retired Comments: Family comes every night to help pt bathe, dress and get to bed. Pt can walk up to 500' with cane and otherwise uses a transport chair for long distances Communication Communication: No difficulties    Cognition  Overall Cognitive Status: Appears within functional limits for tasks assessed/performed Arousal/Alertness: Awake/alert Orientation Level: Appears intact for tasks assessed Behavior During Session: [facility] for tasks performed    Extremity/Trunk Assessment Right Lower Extremity Assessment RLE ROM/Strength/Tone: [facility] for tasks assessed Left Lower Extremity Assessment LLE ROM/Strength/Tone: [facility] for tasks assessed   Balance    End of Session PT - End of Session Equipment Utilized During Treatment: Gait belt Activity Tolerance: Patient tolerated treatment well Patient left: in chair;with call bell/phone within reach;with family/visitor present Nurse Communication: Mobility status   Delorse Lek 01/22/2012, 2:41 PM  Delaney Meigs, PT [phone]

## 2012-01-22 NOTE — Progress Notes (Addendum)
Reassessed patient this afternoon with family, he really wants to go home today.  He's had no further episodes of syncope.  He explains to me that the reason he had the episode yesterday was because he was overexerting.  He did walk a longer distance than he normally walks and at a faster pace than he normally walks and he became overwhelmed and passed out.  He was seen by his cardiologist in the ED yesterday.  He understands that he has significant severe aortic stenosis that is surgically untreatable.  He doesn't want to stay in the hospital any longer.  He is concerned about caring for his wife who is also in the hospital.  I told the patient that he can go home today after physical therapy evaluates him and I find out their recommendations for him and I am recommending close followup for him with his primary care physician and cardiologist in the next 3-5 days. Anayelli Lai [employer].D.

## 2012-01-22 NOTE — Progress Notes (Signed)
I examined patient and reviewed H&P and orders per Dr. Claretha Cooper.  Please see additional orders.  Maryln Manuel, MD pager [phone]

## 2012-01-22 NOTE — H&P (Addendum)
DATE OF ADMISSION:  01/22/2012  PCP:    Renae Fickle, MD   Chief Complaint: Passed Out   HPI: [name] Graham is an 78 y.o. male who passed out in he hospital when he was visiting his wife who is a patient.  He denied any chest pain .  He states he was walking too fast and began to feel dizzy all of a sudden and blacked out.  He denied any chest pain or SOB.  He has a history of Critical AS, and he sees Cardiologist Dr. [name] who also saw him in the ED.  An ED workup was performed and the initial cardiac  Studies were negative and a D-Dimer was also negative.    Past Medical History  Diagnosis Date  . Pulmonary embolus     bilateral  . Hypothyroid   . Hypertension   . CAD (coronary artery disease)   . Chronic atrial fibrillation     INR therpeutic on Coumadin  . Dementia     hallucinations status post psychiatric evaluation during admission  . Kidney cyst, acquired   . A-fib   . CAD (coronary artery disease)     cath 2006, no stents, ejection fraction  . Leukocytosis   . Tremor   . Parkinson disease     Past Surgical History  Procedure Date  . Mole removakl     Medications:  HOME MEDS: Prior to Admission medications   Medication Sig Start Date End Date Taking? Authorizing Provider  acetaminophen (ARTHRITIS PAIN RELIEF) 650 MG CR tablet Take 650 mg by mouth. Take 2 tabs AM and 1 tab PM    Yes Historical Provider, MD  allopurinol (ZYLOPRIM) 300 MG tablet Take 300 mg by mouth daily.     Yes Historical Provider, MD  aspirin 81 MG chewable tablet Chew 81 mg by mouth daily.   Yes Historical Provider, MD  captopril (CAPOTEN) 25 MG tablet Take 25 mg by mouth 2 (two) times daily. As directed     Yes Historical Provider, MD  Cyanocobalamin (VITAMIN B-12 IJ) Inject as directed every 30 (thirty) days.   Yes Historical Provider, MD  furosemide (LASIX) 80 MG tablet Take 80 mg by mouth daily.     Yes Historical Provider, MD  levothyroxine (SYNTHROID) 175 MCG tablet Take 175 mcg by  mouth daily.     Yes Historical Provider, MD  LORazepam (ATIVAN) 1 MG tablet Take 1 mg by mouth as needed. For anxiety   Yes Historical Provider, MD  metoprolol (TOPROL XL) 50 MG 24 hr tablet Take 50 mg by mouth daily.     Yes Historical Provider, MD  Misc Natural Products (OSTEO BI-FLEX ADV DOUBLE ST PO) Take by mouth. Take 2 tabs daily    Yes Historical Provider, MD  Omega-3 Fatty Acids (FISH OIL) 1000 MG CAPS Take by mouth. Take 1 capsule by mouth two times a day    Yes Historical Provider, MD  Omeprazole 20 MG TBEC Take by mouth. 1 tablet once a day    Yes Historical Provider, MD  paliperidone (INVEGA) 3 MG 24 hr tablet Take 3 mg by mouth daily.     Yes Historical Provider, MD  pravastatin (PRAVACHOL) 10 MG tablet Take 10 mg by mouth daily. Take 2 tabs daily    Yes Historical Provider, MD  sucralfate (CARAFATE) 1 G tablet Take 1 g by mouth 2 (two) times daily.     Yes Historical Provider, MD  warfarin (COUMADIN) 5 MG tablet Take 5  mg by mouth at bedtime.    Yes Historical Provider, MD    Allergies:  Allergies  Allergen Reactions  . Morphine And Related     Goes Crazy  . Nifedipine Other (See Comments)    "makes me go crazy"  . Nitroglycerin     Social History:   reports that he has quit smoking. He does not have any smokeless tobacco history on file. He reports that he does not drink alcohol or use illicit drugs.  Family History: Family History  Problem Relation Age of Onset  . Heart failure Mother   . Microcephaly Father     Review of Systems:  The patient denies anorexia, fever, weight loss,, vision loss, decreased hearing, hoarseness, chest pain, syncope, dyspnea on exertion, peripheral edema, balance deficits, hemoptysis, abdominal pain, melena, hematochezia, severe indigestion/heartburn, hematuria, incontinence, genital sores, muscle weakness, suspicious skin lesions, transient blindness, difficulty walking, depression, unusual weight change, abnormal bleeding, enlarged  lymph nodes, angioedema, and breast masses.   Physical Exam:  GEN: Pleasant Elderly 78 year old Obese Caucasian male examined  and in no acute distress; cooperative with exam Filed Vitals:   01/22/12 0000 01/22/12 0015 01/22/12 0030 01/22/12 0100  BP: 135/84 125/76 124/72 133/81  Pulse: 81 76 69 78  Temp:      TempSrc:      Resp: 11 15 16 14   Height:      Weight:      SpO2: 98% 97% 96% 96%   Blood pressure 133/81, pulse 78, temperature 97.6 F (36.4 C), temperature source Oral, resp. rate 14, height 6' (1.829 m), weight 124.739 kg (275 lb), SpO2 96.00%. PSYCH: He is alert and oriented x4; does not appear anxious does not appear depressed; affect is normal HEENT: Normocephalic and Atraumatic, Mucous membranes pink; PERRLA; EOM intact; Fundi:  Benign;  No scleral icterus, Nares: Patent, Oropharynx: Clear,Poor Dentition, Neck:  FROM, no cervical lymphadenopathy nor thyromegaly or carotid bruit; no JVD; Breasts:: Not examined CHEST WALL: No tenderness CHEST: Normal respiration, clear to auscultation bilaterally HEART: Regular rate and rhythm; no murmurs rubs or gallops BACK: No kyphosis or scoliosis; no CVA tenderness ABDOMEN: Positive Bowel Sounds, Obese, soft non-tender; no masses, no organomegaly.   Rectal Exam: Not done EXTREMITIES: No bone or joint deformity; age-appropriate arthropathy of the hands and knees; no cyanosis, clubbing or edema; no ulcerations. Genitalia: not examined PULSES: 2+ and symmetric SKIN: Normal hydration no rash or ulceration CNS: Cranial nerves 2-12 grossly intact no focal neurologic deficit   Labs & Imaging Results for orders placed during the hospital encounter of 01/21/12 (from the past 48 hour(s))  CBC     Status: Abnormal   Collection Time   01/21/12  6:44 PM      Component Value Range Comment   WBC 5.7  4.0 - 10.5 K/uL    RBC 3.73 (*) 4.22 - 5.81 MIL/uL    Hemoglobin 12.5 (*) 13.0 - 17.0 g/dL    HCT 16.1 (*) 09.6 - 52.0 %    MCV 100.0  78.0 -  100.0 fL    MCH 33.5  26.0 - 34.0 pg    MCHC 33.5  30.0 - 36.0 g/dL    RDW 04.5  40.9 - 81.1 %    Platelets 148 (*) 150 - 400 K/uL   BASIC METABOLIC PANEL     Status: Abnormal   Collection Time   01/21/12  6:44 PM      Component Value Range Comment   Sodium 143  135 - 145 mEq/L    Potassium 3.7  3.5 - 5.1 mEq/L    Chloride 103  96 - 112 mEq/L    CO2 29  19 - 32 mEq/L    Glucose, Bld 116 (*) 70 - 99 mg/dL    BUN 21  6 - 23 mg/dL    Creatinine, Ser 7.82  0.50 - 1.35 mg/dL    Calcium 9.1  8.4 - 95.6 mg/dL    GFR calc non Af Amer 56 (*) >90 mL/min    GFR calc Af Amer 65 (*) >90 mL/min   POCT I-STAT TROPONIN I     Status: Normal   Collection Time   01/21/12  6:57 PM      Component Value Range Comment   Troponin i, poc 0.02  0.00 - 0.08 ng/mL    Comment 3            CBC     Status: Abnormal   Collection Time   01/21/12  8:19 PM      Component Value Range Comment   WBC 5.7  4.0 - 10.5 K/uL    RBC 3.70 (*) 4.22 - 5.81 MIL/uL    Hemoglobin 12.5 (*) 13.0 - 17.0 g/dL    HCT 21.3 (*) 08.6 - 52.0 %    MCV 100.5 (*) 78.0 - 100.0 fL    MCH 33.8  26.0 - 34.0 pg    MCHC 33.6  30.0 - 36.0 g/dL    RDW 57.8  46.9 - 62.9 %    Platelets 152  150 - 400 K/uL   DIFFERENTIAL     Status: Abnormal   Collection Time   01/21/12  8:19 PM      Component Value Range Comment   Neutrophils Relative 51  43 - 77 %    Neutro Abs 2.9  1.7 - 7.7 K/uL    Lymphocytes Relative 27  12 - 46 %    Lymphs Abs 1.5  0.7 - 4.0 K/uL    Monocytes Relative 12  3 - 12 %    Monocytes Absolute 0.7  0.1 - 1.0 K/uL    Eosinophils Relative 9 (*) 0 - 5 %    Eosinophils Absolute 0.5  0.0 - 0.7 K/uL    Basophils Relative 1  0 - 1 %    Basophils Absolute 0.1  0.0 - 0.1 K/uL   COMPREHENSIVE METABOLIC PANEL     Status: Abnormal   Collection Time   01/21/12  8:19 PM      Component Value Range Comment   Sodium 145  135 - 145 mEq/L    Potassium 3.6  3.5 - 5.1 mEq/L    Chloride 105  96 - 112 mEq/L    CO2 29  19 - 32 mEq/L     Glucose, Bld 113 (*) 70 - 99 mg/dL    BUN 22  6 - 23 mg/dL    Creatinine, Ser 5.28  0.50 - 1.35 mg/dL    Calcium 9.0  8.4 - 41.3 mg/dL    Total Protein 6.7  6.0 - 8.3 g/dL    Albumin 3.4 (*) 3.5 - 5.2 g/dL    AST 15  0 - 37 U/L    ALT 9  0 - 53 U/L    Alkaline Phosphatase 85  39 - 117 U/L    Total Bilirubin 0.4  0.3 - 1.2 mg/dL    GFR calc non Af Amer 57 (*) >90 mL/min    GFR  calc Af Amer 66 (*) >90 mL/min   PROTIME-INR     Status: Abnormal   Collection Time   01/21/12  8:19 PM      Component Value Range Comment   Prothrombin Time 22.4 (*) 11.6 - 15.2 seconds    INR 1.93 (*) 0.00 - 1.49   APTT     Status: Abnormal   Collection Time   01/21/12  8:19 PM      Component Value Range Comment   aPTT 48 (*) 24 - 37 seconds   D-DIMER, QUANTITATIVE     Status: Normal   Collection Time   01/21/12  8:19 PM      Component Value Range Comment   D-Dimer, Quant 0.27  0.00 - 0.48 ug/mL-FEU   URINALYSIS, ROUTINE W REFLEX MICROSCOPIC     Status: Normal   Collection Time   01/21/12  8:30 PM      Component Value Range Comment   Color, Urine YELLOW  YELLOW    APPearance CLEAR  CLEAR    Specific Gravity, Urine 1.013  1.005 - 1.030    pH 5.5  5.0 - 8.0    Glucose, UA NEGATIVE  NEGATIVE mg/dL    Hgb urine dipstick NEGATIVE  NEGATIVE    Bilirubin Urine NEGATIVE  NEGATIVE    Ketones, ur NEGATIVE  NEGATIVE mg/dL    Protein, ur NEGATIVE  NEGATIVE mg/dL    Urobilinogen, UA 1.0  0.0 - 1.0 mg/dL    Nitrite NEGATIVE  NEGATIVE    Leukocytes, UA NEGATIVE  NEGATIVE MICROSCOPIC NOT DONE ON URINES WITH NEGATIVE PROTEIN, BLOOD, LEUKOCYTES, NITRITE, OR GLUCOSE <1000 mg/dL.   Dg Chest 2 View  01/21/2012  *RADIOLOGY REPORT*  Clinical Data: Chest pain and near-syncope.  CHEST - 2 VIEW  Comparison: Portable chest 08/21/2011.  Findings: The heart is mildly enlarged.  The lungs are clear. Emphysematous changes are noted.  Degenerative changes are evident in the thoracic spine with fusion of anterior osteophytes across  multiple levels.  IMPRESSION:  1. Mild cardiomegaly without failure. 2.  Emphysema. 3.  No acute cardiopulmonary disease.  Original Report Authenticated By: Jamesetta Orleans. MATTERN, M.D.    EKG:  Atrial fibrillation rate 98 No acute S-T Changes   Assessment: Present on Admission:  .Syncope and collapse .HYPERTENSION, BENIGN .HYPERCHOLESTEROLEMIA  IIA .AORTIC STENOSIS/ INSUFFICIENCY, NON-RHEUMATIC .Atrial fibrillation .PULMONARY EMBOLISM .DEEP VENOUS THROMBOPHLEBITIS, CHRONIC .Coronary artery aneurysm  Anemia  Plan:      Admit to 23 hour Observation Telemetry Bed Syncope workup, Cardiac Enzymes Neuro Checks and Orthostatic Vitals q Shift Anemia Panel Reconcile Home Meds Adjust Coumadin dosage per pharmacy Other plans as per orders.    CODE STATUS:      FULL CODE          Jakobee Brackins C 01/22/2012, 1:56 AM

## 2012-01-22 NOTE — Discharge Instructions (Signed)
Do not ambulate more than 1000 feet without stopping to sit down and rest Fall precautions recommended at home Have someone available to assist you at all times when you are ambulating Follow up with neurologist next week as scheduled for new patient appointment Have your PT/INR rechecked in 3 days.   Have your blood pressure rechecked in 3-5 days with your primary care physician or cardiologist Return if symptoms recur, worsen or new problems develop.    Syncope Syncope (fainting) is a sudden, short loss of consciousness. People normally fall to the ground when they faint. Recovery is often fast. HOME CARE  Do not drive or use machines. Wait until your doctor says it is safe to do so.   If you have diabetes, check your blood sugar. If it is low (below 70), you need to drink or eat something sweet. If over 300, call your doctor.   If you have a blood pressure machine at home, take your blood pressure. If the top number is below 100 or above 170, call your doctor.   Lie down until you feel normal.   Drink extra fluids (water, juice, soup).  GET HELP RIGHT AWAY IF:   You pass out (faint) when sitting or lying down. Do not drive. Call your local emergency services (911 in [place] if no one is there to help you.   There is chest pain.   You feel sick to your stomach (nauseous) or keep throwing up (vomiting).   You have very bad belly (abdominal) pain.   You feel your heartbeat is fast or not normal.   You lose feeling in some part of the body.   You cannot move your arms or legs.   You cannot talk well and get confused.   You feel weak or cannot see well.   You get sweaty and feel lightheaded.  MAKE SURE YOU:   Understand these instructions.   Will watch your condition.   Will get help right away if you are not doing well or get worse.  Document Released: 01/06/2008 Document Revised: 07/09/2011 Document Reviewed: 01/06/2008 [facility] Patient Information 2012 [place],  [place].  Aortic Stenosis Aortic stenosis, or aortic valve stenosis, is a narrowing of the aortic valve. When the aortic valve is narrowed, the valve does not open and close very well. This restricts blood flow between the left side of the heart and the aorta (the large artery which takes blood to the rest of the body). This restriction makes it hard for your heart to pump blood. This extra work can weaken your heart and can lead to heart failure. CAUSES  Causes of aortic valve stenosis can vary. Some of these can include:  Calcium deposits on the aortic valve. Calcium can buildup on the aortic valve and make it stiff. This cause of aortic stenosis is most common in people over the age of 78.   Congenital heart defect. This can occur during the development of the fetus and can result in an aortic valve defect.   Rheumatic fever. Rheumatic fever is a bacterial infection that can develop from a strep throat infection. The bacteria from rheumatic fever can attach themselves to the valve. This can cause scarring on the aortic valve, causing it to become narrow.  SYMPTOMS  Symptoms of aortic valve stenosis develop when the valve disease is severe. Symptoms can include:  Shortness of breath, especially with physical activity.   Feeling tired (fatigue).   Chest pain (angina) or tightness.   Feeing  your heart race or beat funny (heart palpitations).   Dizziness or fainting.  DIAGNOSIS  Aortic stenosis is diagnosed through:  A physical exam and symptoms.   A heart murmur.   Echocardiography. This test uses sound waves to produce images of your heart.  TREATMENT   Surgery is the treatment for aortic valve stenosis.   Surgery may not be needed right away. Surgery is necessary when narrowing of the aortic valve becomes severe, and symptoms develop or become worse.   Medications cannot reverse aortic valve stenosis.  HOME CARE INSTRUCTIONS   If you have aortic stenosis, you many need to avoid  strenuous physical activity. Talk with your caregiver about what types of activities you should avoid.   If you are a woman with aortic valve stenosis and are of child-bearing age, talk to your caregiver before you become pregnant.   If you become pregnant, you will need to be monitored by your obstetrician and cardiologist throughout your pregnancy, labor and delivery, and after delivery.  SEEK IMMEDIATE MEDICAL CARE IF:  You develop chest pain or tightness.   You develop shortness of breath or difficulty breathing.   You develop lightheadedness or fainting.   You have heart palpitations or skipped heartbeats.  Document Released: 04/18/2003 Document Revised: 07/09/2011 Document Reviewed: 11/26/2009 [facility] Patient Information 2012 [place], [place].   Home Safety and Preventing Falls Falls are a leading cause of injury and while they affect all age groups, falls have greater short-term and long-term impact on older age groups. However, falls should not be a part of life or aging. It is possible for individuals and their families to use preventive measures to significantly decrease the likelihood that anyone, especially an older adult, will fall. There are many simple measures which can make your home safer with respect to preventing falls. The following actions can help reduce falls among all members of your family and are especially important as you age, when your balance, lower limb strength, coordination, and eyesight may be declining. The use of preventive measures will help to reduce you and your family's risk of falls and serious medical consequences. OUTDOORS  Repair cracks and edges of walkways and driveways.   Remove high doorway thresholds and trim shrubbery on the main path into your home.   Ensure there is good outside lighting at main entrances and along main walkways.   Clear walkways of tools, rocks, debris, and clutter.   Check that handrails are not broken and are  securely fastened. Both sides of steps should have handrails.   In the garage, be attentive to and clean up grease or oil spills on the cement. This can make the surface extremely slippery.   In winter, have leaves, snow, and ice cleared regularly.   Use sand or salt on walkways during winter months.  BATHROOM  Install grab bars by the toilet and in the tub and shower.   Use non-skid mats or decals in the tub or shower.   If unable to easily stand unsupported while showering, place a plastic non slip stool in the shower to sit on when needed.   Install night lights.   Keep floors dry and clean up all water on the floor immediately.   Remove soap buildup in tub or shower on a regular basis.   Secure bath mats with non-slip, double-sided rug tape.   Remove tripping hazards from the floors.  BEDROOMS  Install night lights.   Do not use oversized bedding.   Make  sure a bedside light is easy to reach.   Keep a telephone by your bedside.   Make sure that you can get in and out of your bed easily.   Have a firm chair, with side arms, to use for getting dressed.   Remove clutter from around closets.   Store clothing, bed coverings, and other household items where you can reach them comfortably.   Remove tripping hazards from the floor.  LIVING AREAS AND STAIRWAYS  Turn on lights to avoid having to walk through dark areas.   Keep lighting uniform in each room. Place brighter lightbulbs in darker areas, including stairways.   Replace lightbulbs that burn out in stairways immediately.   Arrange furniture to provide for clear pathways.   Keep furniture in the same place.   Eliminate or tape down electrical cables in high traffic areas.   Place handrails on both sides of stairways. Use handrails when going up or down stairs.   Most falls occur on the top or bottom 3 steps.   Fix any loose handrails. Make sure handrails on both sides of the stairways are as long as the  stairs.   Remove all walkway obstacles.   Coil or tape electrical cords off to the side of walking areas and out of the way. If using many extension cords, have an electrician put in a new wall outlet to reduce or eliminate them.   Make sure spills are cleaned up quickly and allow time for drying before walking on freshly cleaned floors.   Firmly attach carpet with non-skid or two-sided tape.   Keep frequently used items within easy reach.   Remove tripping hazards such as throw rugs and clutter in walkways. Never leave objects on stairs.   Get rid of throw rugs elsewhere if possible.   Eliminate uneven floor surfaces.   Make sure couches and chairs are easy to get into and out of.   Check carpeting to make sure it is firmly attached along stairs.   Make repairs to worn or loose carpet promptly.   Select a carpet pattern that does not visually hide the edge of steps.   Avoid placing throw rugs or scatter rugs at the top or bottom of stairways, or properly secure with carpet tape to prevent slippage.   Have an electrician put in a light switch at the top and bottom of the stairs.   Get light switches that glow.   Avoid the following practices: hurrying, inattention, obscured vision, carrying large loads, and wearing slip-on shoes.   Be aware of all pets.  KITCHEN  Place items that are used frequently, such as dishes and food, within easy reach.   Keep handles on pots and pans toward the center of the stove. Use back burners when possible.   Make sure spills are cleaned up quickly and allow time for drying.   Avoid walking on wet floors.   Avoid hot utensils and knives.   Position shelves so they are not too high or low.   Place commonly used objects within easy reach.   If necessary, use a sturdy step stool with a grab bar when reaching.   Make sure electrical cables are out of the way.   Do not use floor polish or wax that makes floors slippery.  OTHER HOME  FALL PREVENTION STRATEGIES  Wear low heel or rubber sole shoes that are supportive and fit well.   Wear closed toe shoes.   Know and watch for  side effects of medications. Have your caregiver or pharmacist look at all your medicines, even over-the-counter medicines. Some medicines can make you sleepy or dizzy.   Exercise regularly. Exercise makes you stronger and improves your balance and coordination.   Limit use of alcohol.   Use eyeglasses if necessary and keep them clean. Have your vision checked every year.   Organize your household in a manner that minimizes the need to walk distances when hurried, or go up and down stairs unnecessarily. For example, have a phone placed on at least each floor of your home. If possible, have a phone beside each sitting or lying area where you spend the most time at home. Keep emergency numbers posted at all phones.   Use non-skid floor wax.   When using a ladder, make sure:   The base is firm.   All ladder feet are on level ground.   The ladder is angled against the wall properly.   When climbing a ladder, face the ladder and hold the ladder rungs firmly.   If reaching, always keep your hips and body weight centered between the rails.   When using a stepladder, make sure it is fully opened and both spreaders are firmly locked.   Do not climb a closed stepladder.   Avoid climbing beyond the second step from the top of a stepladder and the 4th rung from the top of an extension ladder.   Learn and use mobility aids as needed.   Change positions slowly. Arise slowly from sitting and lying positions. Sit on the edge of your bed before getting to your feet.   If you have a history of falls, ask someone to add color or contrast paint or tape to grab bars and handrails in your home.   If you have a history of falls, ask someone to place contrasting color strips on first and last steps.   Install an electrical emergency response system if you  need one, and know how to use it.   If you have a medical or other condition that causes you to have limited physical strength, it is important that you reach out to family and friends for occasional help.  FOR CHILDREN:  If young children are in the home, use safety gates. At the top of stairs use screw-mounted gates; use pressure-mounted gates for the bottom of the stairs and doorways between rooms.   Young children should be taught to descend stairs on their stomachs, feet first, and later using the handrail.   Keep drawers fully closed to prevent them from being climbed on or pulled out entirely.   Move chairs, cribs, beds and other furniture away from windows.   Consider installing window guards on windows ground floor and up, unless they are emergency fire exits. Make sure they have easy release mechanisms.   Consider installing special locks that only allow the window to be opened to a certain height.   Never rely on window screens to prevent falls.   Never leave babies alone on changing tables, beds or sofas. Use a changing table that has a restraining strap.   When a child can pull to a standing position, the crib mattress should be adjusted to its lowest position. There should be at least 26 inches between the top rails of the crib drop side and the mattress. Toys, bumper pads, and other objects that can be used as steps to climb out should be removed from the crib.   On bunk  beds never allow a child under age 51 to sleep on the top bunk. For older children, if the upper bunk is not against a wall, use guard rails on both sides. No matter how old a child is, keep the guard rails in place on the top bunk since children roll during sleep. Do not permit horseplay on bunks.   Grass and soil surfaces beneath backyard playground equipment should be replaced with hardwood chips, shredded wood mulch, sand, pea gravel, rubber, crushed stone, or another safer material at depths of at least 9  to 12 inches.   When riding bikes or using skates, skateboards, skis, or snowboards, require children to wear helmets. Look for those that have stickers stating that they meet or exceed safety standards.   Vertical posts or pickets in deck, balcony, and stairway railings should be no more than 3 1/2 inches apart if a young baby will have access to the area. The space between horizontal rails or bars, and between the floor and the first horizontal rail or bar, should be no more than 3 1/2 inches.  Document Released: 07/10/2002 Document Revised: 07/09/2011 Document Reviewed: 05/09/2009 [facility] Patient Information 2012 [place], [place].  Fall Prevention in Hospitals As a patient in the hospital, your condition and the treatments you receive can increase your risk for falls. Our desire is to minimize any fall risk for patients. It is important that you learn how to decrease fall risks during your stay at the hospital. Let your caregiver know if you have a history of falls or any conditions that make you feel weak, unsteady or light-headed. Also, let your caregiver know if you have any problems with your vision, or need to put on your glasses before getting up. FALLS MAY OCCUR IN THE HOSPITAL BECAUSE:  Medications such as tranquilizers, sleeping pills, pain relievers, blood pressure pills, water pills and insulin, among others, all may make you dizzy, dazed and/or confused.   Your illness, enemas, laxatives, long periods without food, tests or surgery may leave you weak and unsteady.   You may have to use the restroom more than usual while in the hospital.   Lying down in bed for long periods of time can cause you to be dizzy and unsteady when you first get up.   Your treatment requires medical equipment with electrical cords and tubing, all of which can cause you to get tangled or to trip.   The hospital is a new and unfamiliar environment for you. This is worse at night.  Falls can cause serious  injuries and often can be prevented if fall precautions are observed. If we think you are at a risk for falls, we may place a fall risk band on your wrist. This band immediately alerts all staff that you are at a risk for falls. That way, they can work with you to take steps to prevent you from experiencing a fall. We may also place a sign in your room notifying your healthcare team that you have a condition or a past history that increases your risk of falls. If you are at high risk of a fall, or have had a fall before, we may put a bed alarm or ringer on your bed to notify your caregiver when you are getting up so that they can come and assist you. Patients with a fall history may also be given hip protectors and/or helmets to prevent fall injuries. Below are the most important steps you can take to prevent a  fall. SAFETY GUIDELINES FOR PREVENTING FALLS  Your caregiver will tell you if it is safe for you to get out of bed by yourself or if you should always call for help before getting out of bed. Your caregiver may also offer to assist you in getting safely to the toilet whenever he/she is in your room.   Call for help if you feel dizzy or weak before getting out of bed. This is usually worse after sitting or lying for long periods. If you must get up without waiting for help, sit up in bed awhile before standing. Then get up carefully, and slowly begin to walk. If you feel dizzy or unsure of your footing, return to bed and wait for assistance.   Generally, you should remain lying or seated, rather than standing, while waiting for help. Be patient; someone will come to assist you as soon as possible.   Wear rubber-soled or crepe-soled slippers or shoes whenever you walk in the hospital. Check with your caregiver if you do not have any. A mat may be placed on the floor next to your bed to reduce the chance of slipping on the floor.   Do not remove restraints or tamper with side rails that may be in use.  Side rails and restraints are reminders to stay in bed and are used to keep you safe.   When you need help, use your call light by your bed or in the bathroom. Wait for one of your caregivers to help you.   Walk slowly and carefully when out of bed. Do not lean or support yourself on rolling objects such as intravenous poles or bedside tables.   Ask your caregiver for help if your telephone, bedside table or call light are not within your easy reach. Falls can occur when reaching from bed, not just while getting up.   Ask your caregiver to put the side rails of your bed up to prevent you from accidentally rolling out of bed.   If you use a walker, be sure that it is placed right beside your bed before your caregiver leaves your room.   If you do not feel you are strong enough to safely make it to the bathroom, ask your caregiver for a bedside commode instead.   Let your caregiver know if there is a spill on the floor.   If your caregiver determines you can get out of bed by yourself safely, always pay careful attention to the medical equipment, electrical cords and tubes around you. If you cannot get by the equipment, call your caregiver for assistance.   Always turn on the light at nighttime before getting out of bed.   When getting up from and into bed, avoid being distracted by the TV, telephone, or another person in your room. Falls often occur when a person is distracted when moving from the bed or around their room.  Document Released: 07/17/2000 Document Revised: 07/09/2011 Document Reviewed: 02/11/2009   Fall Prevention, Elderly Falls are the leading cause of injuries, accidents, and accidental deaths in people over the age of 46. Falling is a real threat to your ability to live on your own. CAUSES   Poor eyesight or poor hearing can make you more likely to fall.   Illnesses and physical conditions can affect your strength and balance.   Poor lighting, throw rugs and pets in  your home can make you more likely to trip or slip.   The side effects of some  medicines can upset your balance and lead to falling. These include medicines for depression, sleep problems, high blood pressure, diabetes, and heart conditions.  PREVENTION  Be sure your home is as safe as possible. Here are some tips:  Wear shoes with non-skid soles (not house slippers).   Be sure your home and outside area are well lit.   Use night lights throughout your house, including hallways and stairways.   Remove clutter and clean up spills on floors and walkways.   Remove throw rugs or fasten them to the floor with carpet tape. Tack down carpet edges.   Do not place electrical cords across pathways.   Install grab bars in your bathtub, shower, and toilet area. Towel bars should not be used as a grab bar.   Install handrails on both sides of stairways.   Do not climb on stools or stepladders. Get someone else to help with jobs that require climbing.   Do not wax your floors at all, or use a non-skid wax.   Repair uneven or unsafe sidewalks, walkways or stairs.   Keep frequently used items within reach.   Be aware of pets so you do not trip.  Get regular check-ups from your doctor, and take good care of yourself:  Have your eyes checked every year for vision changes, cataracts, glaucoma, and other eye problems. Wear eyeglasses as directed.   Have your hearing checked every 2 years, or anytime you or others think that you cannot hear well. Use hearing aids as directed.   See your caregiver if you have foot pain or corns. Sore feet can contribute to falls.   Let your caregiver know if a medicine is making you feel dizzy or making you lose your balance.   Use a cane, walker, or wheelchair as directed. Use walker or wheelchair brakes when getting in and out.   When you get up from bed, sit on the side of the bed for 1 to 2 minutes before you stand up. This will give your blood pressure time  to adjust, and you will feel less dizzy.     If you need to go to the bathroom often, consider using a bedside commode.  Keep your body in good shape:  Get regular exercise, especially walking.   Do exercises to strengthen the muscles you use for walking and lifting.   Do not smoke.   Minimize use of alcohol.  SEEK IMMEDIATE MEDICAL CARE IF:   You feel dizzy, weak, or unsteady on your feet.   You feel confused.   You fall.  Document Released: 07/20/2005 Document Revised: 07/09/2011 Document Reviewed: 01/14/2007 [facility] Patient Information 2012 [place], [place]. [employer] Patient Information 2012 [place], [place].   Fall Prevention  Falls cause injuries and can affect all age groups. It is possible to prevent falls.  HOME CARE  Wear shoes with non-slip soles (not slippers).   Have your home and outside area well lit.   Use night lights throughout your home.   Remove clutter.   Clean up floor spills.   Remove throw rugs or fasten them to the floor with carpet tape.   Do not place electrical cords across pathways.   Put grab bars by your tub, shower, and toilet. Do not use towel bars as grab bars.   Put handrails on both sides of the stairway.   Do not climb on stools or stepladders.   Do not wax your floors.   Repair uneven or unsafe sidewalks, walkways, or  stairs.   Keep items you use a lot within reach.   Be aware of pets.   Change positions slowly.   Sit up on the side of your bed for a few minutes before standing. This prevents dizziness.   Keep a bedside commode by your bed at night.   If you have a cane or walker, be sure to use it whenever you are walking.   Have your eyes and hearing checked every year.  Ask your doctor what other things you can do to prevent falls. GET HELP RIGHT AWAY IF:  You feel dizzy, weak, or unsteady on your feet.   You feel confused.   You fall and hurt yourself.  Document Released: 05/16/2009 Document Revised:  07/09/2011 Document Reviewed: 05/16/2009 [facility] Patient Information 2012 [place], [place].

## 2012-01-28 ENCOUNTER — Ambulatory Visit: Payer: Medicare Other | Admitting: Neurology

## 2012-02-10 ENCOUNTER — Ambulatory Visit: Payer: Medicare Other | Admitting: Cardiology

## 2012-02-11 ENCOUNTER — Ambulatory Visit (INDEPENDENT_AMBULATORY_CARE_PROVIDER_SITE_OTHER): Payer: Medicare Other | Admitting: Cardiology

## 2012-02-11 ENCOUNTER — Encounter: Payer: Self-pay | Admitting: Cardiology

## 2012-02-11 VITALS — BP 123/70 | HR 102 | Resp 17 | Ht 72.0 in | Wt 283.0 lb

## 2012-02-11 DIAGNOSIS — I359 Nonrheumatic aortic valve disorder, unspecified: Secondary | ICD-10-CM

## 2012-02-11 DIAGNOSIS — R209 Unspecified disturbances of skin sensation: Secondary | ICD-10-CM

## 2012-02-11 DIAGNOSIS — R200 Anesthesia of skin: Secondary | ICD-10-CM

## 2012-02-11 DIAGNOSIS — I2699 Other pulmonary embolism without acute cor pulmonale: Secondary | ICD-10-CM

## 2012-02-11 NOTE — Assessment & Plan Note (Addendum)
Severe but not good candidate for TAVR.  Has had question of multiple TIA in the past with evidence of prior CVA.  Has multiple comorbidities.  Had previously briefly reviewed with Dr. Barry Dienes.  He might be a candidate for balloon valvuloplasty.

## 2012-02-11 NOTE — Progress Notes (Signed)
HPI:  He has been somewhat weaker since he spent the last four months in the hospital with his wife.  He has not been getting as much exercise in general.  He also had one episode of some chest pain, and had syncope after walking to fast.  His family has been uncertain about the prospect for TAVR.  Current Outpatient Prescriptions  Medication Sig Dispense Refill  . acetaminophen (ARTHRITIS PAIN RELIEF) 650 MG CR tablet Take 650 mg by mouth. Take 2 tabs AM and 1 tab PM       . allopurinol (ZYLOPRIM) 300 MG tablet Take 300 mg by mouth daily.        Marland Kitchen aspirin 81 MG chewable tablet Chew 81 mg by mouth daily.      . captopril (CAPOTEN) 25 MG tablet Take 25 mg by mouth 2 (two) times daily. As directed        . Ciprofloxacin (CIPRO PO) Take by mouth 2 times daily at 12 noon and 4 pm.      . Cyanocobalamin (VITAMIN B-12 IJ) Inject as directed every 30 (thirty) days.      . furosemide (LASIX) 80 MG tablet Take 80 mg by mouth daily.        Marland Kitchen levothyroxine (SYNTHROID) 175 MCG tablet Take 175 mcg by mouth daily.        Marland Kitchen LORazepam (ATIVAN) 1 MG tablet Take 1 mg by mouth as needed. For anxiety      . metoprolol (TOPROL XL) 50 MG 24 hr tablet Take 50 mg by mouth daily.        . Misc Natural Products (OSTEO BI-FLEX ADV DOUBLE ST PO) Take by mouth. Take 2 tabs daily       . Omega-3 Fatty Acids (FISH OIL) 1000 MG CAPS Take by mouth. Take 1 capsule by mouth two times a day       . Omeprazole 20 MG TBEC Take by mouth. 1 tablet once a day       . paliperidone (INVEGA) 3 MG 24 hr tablet Take 3 mg by mouth daily.        . pravastatin (PRAVACHOL) 10 MG tablet Take 10 mg by mouth daily. Take 2 tabs daily       . sucralfate (CARAFATE) 1 G tablet Take 1 g by mouth 2 (two) times daily.        Marland Kitchen warfarin (COUMADIN) 5 MG tablet Take 5 mg by mouth at bedtime.         Allergies  Allergen Reactions  . Morphine And Related     Goes Crazy  . Nifedipine Other (See Comments)    "makes me go crazy"  . Nitroglycerin      Past Medical History  Diagnosis Date  . Pulmonary embolus     bilateral  . Hypothyroid   . Hypertension   . CAD (coronary artery disease)   . Chronic atrial fibrillation     INR therpeutic on Coumadin  . Dementia     hallucinations status post psychiatric evaluation during admission  . Kidney cyst, acquired   . A-fib   . CAD (coronary artery disease)     cath 2006, no stents, ejection fraction  . Leukocytosis   . Tremor   . Parkinson disease     Past Surgical History  Procedure Date  . Mole removakl     Family History  Problem Relation Age of Onset  . Heart failure Mother   . Microcephaly Father  History   Social History  . Marital Status: Married    Spouse Name: N/A    Number of Children: N/A  . Years of Education: N/A   Occupational History  . Not on file.   Social History Main Topics  . Smoking status: Former Games developer  . Smokeless tobacco: Not on file  . Alcohol Use: No  . Drug Use: No  . Sexually Active: Not on file   Other Topics Concern  . Not on file   Social History Narrative   Has not smoked in 60 years    ROS: Please see the HPI.  All other systems reviewed and negative.  PHYSICAL EXAM:  BP 123/70  Pulse 102  Resp 17  Ht 6' (1.829 m)  Wt 283 lb (128.368 kg)  BMI 38.38 kg/m2  General: Well developed, well nourished, in no acute distress.  Somewhat of a masked faces Head:  Normocephalic and atraumatic. Neck: no JVD Lungs: Clear to auscultation and percussion. Heart: Normal S1 and S2.  3-4/6 SEM.  No diastolic murmur.   Abdomen:  Normal bowel sounds; soft; non tender; no organomegaly Pulses: Pulses normal in all 4 extremities. Extremities: No clubbing or cyanosis. Trace edema.   Neurologic: Alert and oriented x 3.  EKG:  Atrial fib with somewhat rapid ventricular response.  Leftward axis deviation.   ASSESSMENT AND PLAN:

## 2012-02-14 NOTE — Assessment & Plan Note (Signed)
No recurrent episodes. 

## 2012-02-14 NOTE — Assessment & Plan Note (Signed)
Chronic warfarin for this and atrial fibrillation.

## 2012-03-18 ENCOUNTER — Emergency Department (HOSPITAL_COMMUNITY): Payer: Medicare Other

## 2012-03-18 ENCOUNTER — Encounter (HOSPITAL_COMMUNITY): Payer: Self-pay | Admitting: Radiology

## 2012-03-18 ENCOUNTER — Inpatient Hospital Stay (HOSPITAL_COMMUNITY): Payer: Medicare Other

## 2012-03-18 ENCOUNTER — Inpatient Hospital Stay (HOSPITAL_COMMUNITY)
Admission: EM | Admit: 2012-03-18 | Discharge: 2012-03-29 | DRG: 057 | Disposition: A | Discharge status: Skilled Nursing Facility | Payer: Medicare Other | Attending: Internal Medicine | Admitting: Internal Medicine

## 2012-03-18 DIAGNOSIS — I4729 Other ventricular tachycardia: Secondary | ICD-10-CM | POA: Diagnosis not present

## 2012-03-18 DIAGNOSIS — F209 Schizophrenia, unspecified: Secondary | ICD-10-CM | POA: Diagnosis present

## 2012-03-18 DIAGNOSIS — I350 Nonrheumatic aortic (valve) stenosis: Secondary | ICD-10-CM | POA: Diagnosis present

## 2012-03-18 DIAGNOSIS — I472 Ventricular tachycardia, unspecified: Secondary | ICD-10-CM | POA: Diagnosis not present

## 2012-03-18 DIAGNOSIS — Z8673 Personal history of transient ischemic attack (TIA), and cerebral infarction without residual deficits: Secondary | ICD-10-CM

## 2012-03-18 DIAGNOSIS — Z23 Encounter for immunization: Secondary | ICD-10-CM

## 2012-03-18 DIAGNOSIS — R29898 Other symptoms and signs involving the musculoskeletal system: Secondary | ICD-10-CM

## 2012-03-18 DIAGNOSIS — R1310 Dysphagia, unspecified: Secondary | ICD-10-CM | POA: Diagnosis not present

## 2012-03-18 DIAGNOSIS — F0280 Dementia in other diseases classified elsewhere without behavioral disturbance: Principal | ICD-10-CM | POA: Diagnosis present

## 2012-03-18 DIAGNOSIS — A4902 Methicillin resistant Staphylococcus aureus infection, unspecified site: Secondary | ICD-10-CM | POA: Diagnosis present

## 2012-03-18 DIAGNOSIS — I10 Essential (primary) hypertension: Secondary | ICD-10-CM | POA: Diagnosis present

## 2012-03-18 DIAGNOSIS — I4891 Unspecified atrial fibrillation: Secondary | ICD-10-CM

## 2012-03-18 DIAGNOSIS — I2510 Atherosclerotic heart disease of native coronary artery without angina pectoris: Secondary | ICD-10-CM | POA: Diagnosis present

## 2012-03-18 DIAGNOSIS — R32 Unspecified urinary incontinence: Secondary | ICD-10-CM | POA: Diagnosis present

## 2012-03-18 DIAGNOSIS — Z7901 Long term (current) use of anticoagulants: Secondary | ICD-10-CM

## 2012-03-18 DIAGNOSIS — G3183 Dementia with Lewy bodies: Principal | ICD-10-CM | POA: Diagnosis present

## 2012-03-18 DIAGNOSIS — E039 Hypothyroidism, unspecified: Secondary | ICD-10-CM | POA: Diagnosis present

## 2012-03-18 DIAGNOSIS — D539 Nutritional anemia, unspecified: Secondary | ICD-10-CM | POA: Diagnosis present

## 2012-03-18 DIAGNOSIS — Z87891 Personal history of nicotine dependence: Secondary | ICD-10-CM

## 2012-03-18 DIAGNOSIS — Z86711 Personal history of pulmonary embolism: Secondary | ICD-10-CM

## 2012-03-18 DIAGNOSIS — N390 Urinary tract infection, site not specified: Secondary | ICD-10-CM | POA: Diagnosis present

## 2012-03-18 DIAGNOSIS — A498 Other bacterial infections of unspecified site: Secondary | ICD-10-CM | POA: Diagnosis present

## 2012-03-18 DIAGNOSIS — R5381 Other malaise: Secondary | ICD-10-CM | POA: Diagnosis present

## 2012-03-18 DIAGNOSIS — E7800 Pure hypercholesterolemia, unspecified: Secondary | ICD-10-CM | POA: Diagnosis present

## 2012-03-18 DIAGNOSIS — G20 Parkinson's disease: Secondary | ICD-10-CM

## 2012-03-18 DIAGNOSIS — I359 Nonrheumatic aortic valve disorder, unspecified: Secondary | ICD-10-CM | POA: Diagnosis present

## 2012-03-18 DIAGNOSIS — L0231 Cutaneous abscess of buttock: Secondary | ICD-10-CM | POA: Diagnosis present

## 2012-03-18 DIAGNOSIS — K5900 Constipation, unspecified: Secondary | ICD-10-CM | POA: Diagnosis not present

## 2012-03-18 HISTORY — DX: Other symptoms and signs involving the musculoskeletal system: R29.898

## 2012-03-18 HISTORY — DX: Nonrheumatic aortic (valve) stenosis: I35.0

## 2012-03-18 LAB — URINALYSIS, ROUTINE W REFLEX MICROSCOPIC
Bilirubin Urine: NEGATIVE
Glucose, UA: NEGATIVE mg/dL
Hgb urine dipstick: NEGATIVE
Specific Gravity, Urine: 1.02 (ref 1.005–1.030)

## 2012-03-18 LAB — CBC WITH DIFFERENTIAL/PLATELET
Basophils Absolute: 0.1 10*3/uL (ref 0.0–0.1)
Basophils Relative: 1 % (ref 0–1)
Eosinophils Relative: 3 % (ref 0–5)
HCT: 36.9 % — ABNORMAL LOW (ref 39.0–52.0)
Lymphocytes Relative: 17 % (ref 12–46)
MCHC: 33.9 g/dL (ref 30.0–36.0)
Monocytes Absolute: 1 10*3/uL (ref 0.1–1.0)
Neutro Abs: 4.9 10*3/uL (ref 1.7–7.7)
Platelets: 149 10*3/uL — ABNORMAL LOW (ref 150–400)
RDW: 14.5 % (ref 11.5–15.5)
WBC: 7.5 10*3/uL (ref 4.0–10.5)

## 2012-03-18 LAB — PROTIME-INR
INR: 2.69 — ABNORMAL HIGH (ref 0.00–1.49)
Prothrombin Time: 29 seconds — ABNORMAL HIGH (ref 11.6–15.2)

## 2012-03-18 LAB — COMPREHENSIVE METABOLIC PANEL
ALT: 8 U/L (ref 0–53)
AST: 15 U/L (ref 0–37)
Albumin: 3.2 g/dL — ABNORMAL LOW (ref 3.5–5.2)
CO2: 31 mEq/L (ref 19–32)
Calcium: 9 mg/dL (ref 8.4–10.5)
Chloride: 101 mEq/L (ref 96–112)
Creatinine, Ser: 1.16 mg/dL (ref 0.50–1.35)
GFR calc non Af Amer: 58 mL/min — ABNORMAL LOW (ref >90)
Sodium: 140 mEq/L (ref 135–145)

## 2012-03-18 LAB — PRO B NATRIURETIC PEPTIDE: Pro B Natriuretic peptide (BNP): 2088 pg/mL — ABNORMAL HIGH (ref 0–450)

## 2012-03-18 LAB — CK: Total CK: 29 U/L (ref 7–232)

## 2012-03-18 LAB — TSH: TSH: 1.557 u[IU]/mL (ref 0.350–4.500)

## 2012-03-18 LAB — MAGNESIUM: Magnesium: 2.2 mg/dL (ref 1.5–2.5)

## 2012-03-18 LAB — TROPONIN I: Troponin I: <0.3 ng/mL (ref <0.30)

## 2012-03-18 MED ORDER — FISH OIL 1000 MG PO CAPS: 1000.0000 mg | ORAL_CAPSULE | Freq: Two times a day (BID) | ORAL | Status: DC

## 2012-03-18 MED ORDER — POLYETHYLENE GLYCOL 3350 17 G PO PACK
17.0000 g | PACK | Freq: Every day | ORAL | Status: DC
  Administered 2012-03-18 – 2012-03-29 (×11): 17 g via ORAL
  Filled 2012-03-18 (×12): qty 1

## 2012-03-18 MED ORDER — LEVOTHYROXINE SODIUM 175 MCG PO TABS
175.0000 ug | ORAL_TABLET | Freq: Every day | ORAL | Status: DC
  Administered 2012-03-18 – 2012-03-29 (×12): 175 ug via ORAL
  Filled 2012-03-18 (×12): qty 1

## 2012-03-18 MED ORDER — SIMVASTATIN 10 MG PO TABS
10.0000 mg | ORAL_TABLET | Freq: Every day | ORAL | Status: DC
  Administered 2012-03-18 – 2012-03-28 (×11): 10 mg via ORAL
  Filled 2012-03-18 (×12): qty 1

## 2012-03-18 MED ORDER — ONDANSETRON HCL 4 MG PO TABS: 4.0000 mg | ORAL_TABLET | Freq: Four times a day (QID) | ORAL | Status: DC | PRN

## 2012-03-18 MED ORDER — PALIPERIDONE ER 3 MG PO TB24
3.0000 mg | ORAL_TABLET | Freq: Every day | ORAL | Status: DC
  Filled 2012-03-18: qty 1

## 2012-03-18 MED ORDER — LIDOCAINE HCL 2 % IJ SOLN: 5.0000 mL | Freq: Once | INTRAMUSCULAR | Status: DC

## 2012-03-18 MED ORDER — WARFARIN SODIUM 2.5 MG PO TABS
2.5000 mg | ORAL_TABLET | ORAL | Status: AC
  Administered 2012-03-19: 2.5 mg via ORAL
  Filled 2012-03-18: qty 1

## 2012-03-18 MED ORDER — SUCRALFATE 1 G PO TABS
1.0000 g | ORAL_TABLET | Freq: Two times a day (BID) | ORAL | Status: DC
  Administered 2012-03-18 – 2012-03-29 (×22): 1 g via ORAL
  Filled 2012-03-18 (×23): qty 1

## 2012-03-18 MED ORDER — ALLOPURINOL 300 MG PO TABS
300.0000 mg | ORAL_TABLET | Freq: Every day | ORAL | Status: DC
  Administered 2012-03-18 – 2012-03-29 (×12): 300 mg via ORAL
  Filled 2012-03-18 (×12): qty 1

## 2012-03-18 MED ORDER — OMEGA-3-ACID ETHYL ESTERS 1 G PO CAPS
1.0000 g | ORAL_CAPSULE | Freq: Two times a day (BID) | ORAL | Status: DC
  Administered 2012-03-18 – 2012-03-29 (×21): 1 g via ORAL
  Filled 2012-03-18 (×24): qty 1

## 2012-03-18 MED ORDER — LORAZEPAM 1 MG PO TABS
1.0000 mg | ORAL_TABLET | ORAL | Status: DC | PRN
  Administered 2012-03-19 – 2012-03-28 (×7): 1 mg via ORAL
  Filled 2012-03-18 (×7): qty 1

## 2012-03-18 MED ORDER — ASPIRIN 81 MG PO CHEW
81.0000 mg | CHEWABLE_TABLET | Freq: Every day | ORAL | Status: DC
  Administered 2012-03-18 – 2012-03-29 (×12): 81 mg via ORAL
  Filled 2012-03-18 (×12): qty 1

## 2012-03-18 MED ORDER — WARFARIN SODIUM 5 MG PO TABS
5.0000 mg | ORAL_TABLET | ORAL | Status: AC
  Administered 2012-03-18: 5 mg via ORAL
  Filled 2012-03-18: qty 1

## 2012-03-18 MED ORDER — WARFARIN - PHYSICIAN DOSING INPATIENT
Freq: Every day | Status: DC
  Administered 2012-03-19: 18:00:00

## 2012-03-18 MED ORDER — PANTOPRAZOLE SODIUM 40 MG PO TBEC
40.0000 mg | DELAYED_RELEASE_TABLET | Freq: Every day | ORAL | Status: DC
  Administered 2012-03-19 – 2012-03-29 (×11): 40 mg via ORAL
  Filled 2012-03-18 (×10): qty 1

## 2012-03-18 MED ORDER — ONDANSETRON HCL 4 MG/2ML IJ SOLN: 4.0000 mg | Freq: Four times a day (QID) | INTRAMUSCULAR | Status: DC | PRN

## 2012-03-18 MED ORDER — METOPROLOL SUCCINATE 12.5 MG HALF TABLET
12.5000 mg | ORAL_TABLET | Freq: Every day | ORAL | Status: DC
  Administered 2012-03-18 – 2012-03-22 (×5): 12.5 mg via ORAL
  Filled 2012-03-18 (×6): qty 1

## 2012-03-18 NOTE — ED Provider Notes (Signed)
Medical screening examination/treatment/procedure(s) were conducted as a shared visit with non-physician practitioner(s) and myself.  I personally evaluated the patient during the encounter   Dione Booze, MD 03/18/12 1525

## 2012-03-18 NOTE — ED Provider Notes (Signed)
INCISION AND DRAINAGE Performed by: Jaci Carrel Consent: Verbal consent obtained. Risks and benefits: risks, benefits and alternatives were discussed Type: abscess  Body area: Right gluteal   Anesthesia: local infiltration  Local anesthetic: lidocaine 2% w epinephrine  Anesthetic total: 2 ml  Complexity: complex Blunt dissection to break up loculations  Drainage: purulent  Drainage amount: minimal  Packing material: did not warrant packing  Patient tolerance: Patient tolerated the procedure well with no immediate complications.     Jaci Carrel, [place] 03/18/12 [phone]

## 2012-03-18 NOTE — ED Notes (Signed)
Pt presents with weakness X 1 weak. Pt was diagnosed with a bladder infection X 1 week with an increase in intensity of weakness X 1 day. Condition is acute in nature. Condition is made worse by nothing. Condition is made better by nothing. Pt denies any recent illness or pain with urination

## 2012-03-18 NOTE — H&P (Signed)
Hospital Admission Note Date: 03/18/2012  Patient name: [name] Graham Medical record number: [id number] Date of birth: 04/05/1929 Age: 78 y.o. Gender: male PCP: Renae Fickle, MD  Medical Service: Internal Medicine Teaching Service  Attending physician: Dr. Meredith Pel    1st Contact: Dr. Shirlee Latch    Pager: [phone] 2nd Contact: Dr. Tonny Branch    Pager:[phone] After 5 pm or weekends: 1st Contact:      Pager: [phone] 2nd Contact:      Pager: [phone]  Chief Complaint: bilateral leg weakness  History of Present Illness: Mr. Shiflet is an 78 yo man with PMH of Parkinson disease, atrial fibrillation and bilateral PE on Warfarin, dementia, hypothyroidism, HTN presenting to the ED for bilateral leg weakness. He says he was not able to to move starting a day or two ago. This has been a gradual process for him; as long as 6 months ago he started feeling more weak in his legs. This has gradually worsened over time tow the point that he has been bed-bound for the past few weeks, only getting up to use the bathroom. He says that nothing in particular triggered increased weakness over this time, and resting has alleviated it. He denies any pain. No loss of sensation or increased weakness anywhere besides his legs. No other associated factors. He has a 3 year history of PD, and says that his symptoms have been the same. He does not take medications for this. He has a history of intermittent TIA with L. Sided weakness that was diagnosed 3-4 months ago. He says he has episodes of left sided weakness a "few times" a week which lasts about 2 hours. No aggravating or alleviating factors were identified for this condition.  He also complains of an infection on his left gluteus that he first noticed one week ago. This was drained by the ED physician when he came to the ED. He states that he's been having a fever for the past 3-4 days that his daughter (his caretaker) has measured. His temperature at home today was ~100. He has  also recently been treated for what sounds like a UTI  one week ago. He was given a regimen of ciprofloxacin which he is still taking. He says that he has lost control of his urination. No dysuria, his bladder does not feel empty after voiding. He says he does not feel this urine coming and that he soils himself regularly. This has been a problem for "weeks" according to the patient. No loss of control of BMs, he has to take a daily laxative (milk of Mg) in order to have a BM.   He finally has an extensive cardiac history with severe aortic stenosis and atrial fibrillation x 20 years on coumadin. He feels chest pain intermittently when he is "emotionally aggravated," which is about one time a week. He had one episode of syncope in the past year was while he was here in the at [facility] about a month and a half ago when his wife received surgery for an aortic valve replacement. He briefly lost consciousness during this episode and felt mildly confused upon waking up but immediately regained his senses. He denies SOB.  Meds: Medications Prior to Admission  Medication Sig Dispense Refill  . acetaminophen (ARTHRITIS PAIN RELIEF) 650 MG CR tablet Take 650 mg by mouth. Take 2 tabs AM and 1 tab PM       . allopurinol (ZYLOPRIM) 300 MG tablet Take 300 mg by mouth daily.        Marland Kitchen  aspirin 81 MG chewable tablet Chew 81 mg by mouth daily.      . captopril (CAPOTEN) 25 MG tablet Take 25 mg by mouth 2 (two) times daily. As directed        . Ciprofloxacin (CIPRO PO) Take by mouth 2 times daily at 12 noon and 4 pm.       . Cyanocobalamin (VITAMIN B-12 IJ) Inject as directed every 30 (thirty) days.      . furosemide (LASIX) 80 MG tablet Take 80 mg by mouth daily.        Marland Kitchen levothyroxine (SYNTHROID) 175 MCG tablet Take 175 mcg by mouth daily.        Marland Kitchen LORazepam (ATIVAN) 1 MG tablet Take 1 mg by mouth as needed. For anxiety      . metoprolol (TOPROL XL) 50 MG 24 hr tablet Take 50 mg by mouth daily.        . Misc Natural  Products (OSTEO BI-FLEX ADV DOUBLE ST PO) Take by mouth. Take 2 tabs daily       . Omega-3 Fatty Acids (FISH OIL) 1000 MG CAPS Take by mouth. Take 1 capsule by mouth two times a day       . Omeprazole 20 MG TBEC Take by mouth. 1 tablet once a day       . pravastatin (PRAVACHOL) 10 MG tablet Take 10 mg by mouth daily. Take 2 tabs daily       . sucralfate (CARAFATE) 1 G tablet Take 1 g by mouth 2 (two) times daily.        Marland Kitchen warfarin (COUMADIN) 2.5 MG tablet Take 2.5-5 mg by mouth daily. Takes 2.5mg  (1 tablet) daily except 5mg  (2 tablets) on Monday, Wednesday, and Friday      . paliperidone (INVEGA) 3 MG 24 hr tablet Take 3 mg by mouth daily.          Allergies: Allergies as of 03/18/2012 - Review Complete 03/18/2012  Allergen Reaction Noted  . Morphine and related  01/22/2012  . Nifedipine Other (See Comments) 03/07/2009  . Nitroglycerin  01/22/2012   Past Medical History  Diagnosis Date  . Pulmonary embolus     bilateral  . Hypothyroid   . Hypertension   . CAD (coronary artery disease)   . Chronic atrial fibrillation     INR therpeutic on Coumadin  . Dementia     hallucinations status post psychiatric evaluation during admission  . Kidney cyst, acquired   . A-fib   . CAD (coronary artery disease)     cath 2006, no stents, ejection fraction  . Leukocytosis   . Tremor   . Parkinson disease    Past Surgical History  Procedure Date  . Mole removakl    Family History  Problem Relation Age of Onset  . Heart failure Mother   . Microcephaly Father    History   Social History  . Marital Status: Married    Spouse Name: N/A    Number of Children: N/A  . Years of Education: N/A   Occupational History  . Not on file.   Social History Main Topics  . Smoking status: Former Games developer  . Smokeless tobacco: Not on file  . Alcohol Use: No  . Drug Use: No  . Sexually Active: Not on file   Other Topics Concern  . Not on file   Social History Narrative   Has not smoked in 60  years    Review of Systems: Constitutional: Denies fever,  chills, diaphoresis, appetite change and fatigue.  HEENT: Denies photophobia, eye pain, redness, hearing loss, ear pain, congestion, sore throat, rhinorrhea, sneezing, mouth sores, trouble swallowing, neck pain, neck stiffness and tinnitus.  Respiratory: Denies SOB, DOE, cough, chest tightness, and wheezing.  Cardiovascular: Denies chest pain, palpitations and leg swelling.  Gastrointestinal: Denies nausea, vomiting, abdominal pain, diarrhea, constipation, blood in stool and abdominal distention.  Genitourinary: Denies dysuria, +urgency, frequency, hematuria, flank pain and difficulty urinating. +incontinence Musculoskeletal: Denies myalgias, back pain, joint swelling, arthralgias + gait problem.  Skin: Denies pallor, rash and wound.  Neurological: Denies dizziness, seizures, syncope, +weakness, light-headedness, numbness and headaches.  Hematological: Denies adenopathy. Easy bruising, personal or family bleeding history  Psychiatric/Behavioral: Denies suicidal ideation, mood changes, confusion, nervousness, sleep disturbance and agitation  Physical Exam: Blood pressure 103/57, pulse 97, temperature 100 F (37.8 C), temperature source Oral, resp. rate 23, SpO2 92.00%. General: alert, well-developed, and cooperative to examination.  Head: atraumatic. Pt's head leaning to the right on exam Eyes: vision grossly intact, pupils equal, pupils round, pupils reactive to light, R pupil sluggish. Right sided ptosis with increased lid swelling and erythema. no injection and anicteric.  Mouth: pharynx pink and moist, no erythema, and no exudates.  Neck: supple, full ROM, no thyromegaly, no JVD, and no carotid bruits.  Lungs: normal respiratory effort, no accessory muscle use, normal breath sounds, no crackles, and no wheezes. Heart: normal rate, regular rhythm, 3/6 systolic ejection heard throughout precordium, no gallop, and no rub.  Abdomen:  soft, non-tender, normal bowel sounds, no distention, no guarding, no rebound tenderness, no hepatomegaly, and no splenomegaly.  Msk: no joint swelling, no joint warmth, and no redness over joints.  Pulses: 2+ DP/PT pulses bilaterally Extremities: No cyanosis, clubbing, 1+ edema in lower extremities to the mid-tibia Neurologic: alert & oriented X3, cranial nerves II-XII intact, strength 5/5 in upper extremities, 3/5 in hips, 3/5 in knees, and 5/5 in ankles, 1+ biceps, patellar, and achilles reflexes with upgoing babinski sign, sensation intact to light touch. Resting tremor on hands bilaterally Skin: turgor normal. Left gluteus has lesion with ~4 cm of erythema and induration and ~2 cm central ulceration. Psych: Oriented X3, 3/3 objects remembered on recall, 0/3 objects remembered after 5 minutes, normally interactive, good eye contact, not anxious appearing, and not depressed appearing.  Lab results: Basic Metabolic Panel:  Basename 03/18/12 1038 03/18/12 1030  NA 140 --  K 3.6 --  CL 101 --  CO2 31 --  GLUCOSE 109* --  BUN 22 --  CREATININE 1.16 --  CALCIUM 9.0 --  MG -- 2.2  PHOS -- --   Liver Function Tests:  Basename 03/18/12 1038  AST 15  ALT 8  ALKPHOS 86  BILITOT 0.8  PROT 6.7  ALBUMIN 3.2*   CBC:  Basename 03/18/12 1038  WBC 7.5  NEUTROABS 4.9  HGB 12.5*  HCT 36.9*  MCV 99.5  PLT 149*   Cardiac Enzymes:  Basename 03/18/12 1038 03/18/12 1030  CKTOTAL 29 --  CKMB -- --  CKMBINDEX -- --  TROPONINI -- <0.30   Coagulation:  Basename 03/18/12 1038  LABPROT 29.0*  INR 2.69*  Urinalysis:  Basename 03/18/12 1052  COLORURINE YELLOW  LABSPEC 1.020  PHURINE 6.0  GLUCOSEU NEGATIVE  HGBUR NEGATIVE  BILIRUBINUR NEGATIVE  KETONESUR NEGATIVE  PROTEINUR NEGATIVE  UROBILINOGEN 1.0  NITRITE NEGATIVE  LEUKOCYTESUR NEGATIVE   Imaging results:  Ct Head Wo Contrast  03/18/2012  *RADIOLOGY REPORT*  Clinical Data: Weakness.  CT HEAD WITHOUT  CONTRAST  Technique:   Contiguous axial images were obtained from the base of the skull through the vertex without contrast.  Comparison: 11/26/2011.  Findings: No intracranial hemorrhage.  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  No intracranial mass lesion detected on this unenhanced exam.  Global atrophy.  Ventricular prominence unchanged felt to be related to atrophy rather than hydrocephalus.  Vascular calcifications.  Complete opacification right maxillary sinus.  Mucosal thickening/partial opacification ethmoid sinus air cells.  IMPRESSION: No intracranial hemorrhage.  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  Global atrophy.  Complete opacification right maxillary sinus.  Mucosal thickening/partial opacification ethmoid sinus air cells.  Original Report Authenticated By: Fuller Canada, M.D.   Mr Thoracic Spine Wo Contrast  03/18/2012  *RADIOLOGY REPORT*  Clinical Data:  78 year old male with weakness.  Recent bladder infection.  Comparison: CT abdomen and pelvis 08/27/2007.  MRI THORACIC SPINE WITHOUT CONTRAST  Technique: Multiplanar and multiecho pulse sequences of the thoracic spine were obtained without intravenous contrast.  Findings:  Limited sagittal imaging of the cervical spine is remarkable for chronic lower cervical disc degeneration.  No definite significant cervical spinal stenosis.  Exaggerated thoracic kyphosis, but otherwise normal vertebral height alignment.  Normal thoracic marrow signal.  Heterogeneous increased T2 and STIR signal in lower thoracic disc (T9-T10 through T11-T12), but no reactive marrow changes, paraspinal or epidural inflammation.  Occasional benign vertebral body hemangiomas (T10).  No thoracic spinal stenosis.  Occasional small thoracic disc protrusions, and  intermittent facet degeneration congruent with age. Spinal cord signal is within normal limits at all visualized levels.  Conus medullaris better demonstrated on the lumbar section below.  Negative visualized thoracic viscera.  Upper abdominal viscera remarkable for multiple large renal cysts, more so on the left.  IMPRESSION: 1.  No acute or significant findings in the thoracic spine.  Age congruent degenerative changes. 2.  Lumbar findings are below.  MRI LUMBAR SPINE WITHOUT CONTRAST  Technique: Multiplanar and multiecho pulse sequences of the lumbar spine were obtained without intravenous contrast.  Findings:  Transitional lumbosacral anatomy when utilizing the thoracic numbering system which is correlated to the cervical spine.  The S1 level with lumbarized. Correlation with radiographs is recommended prior to any operative intervention.  Mild grade 1 anterolisthesis of L5 on S1 (4 mm) associated with disc and facet degeneration detailed below.  Otherwise normal lumbar vertebral height and alignment. No marrow edema or evidence of acute osseous abnormality.  Negative visualized sacrum.  Multiple large to a very large bilateral renal cystic lesions, visualized portions are stable from the 2009 comparison.  No retroperitoneal lymphadenopathy.  Posterior paraspinal soft tissues are within normal limits.   Visualized lower thoracic spinal cord is normal with conus medularis at L1-L2.  T12-L1:  Negative.  L1-L2:  Negative.  L2-L3:  Minimal disc bulge.  Moderate facet hypertrophy.  No significant stenosis.  L3-L4:  Mild disc bulge.  Moderate facet hypertrophy.  No significant stenosis.  L4-L5:  Moderate spinal stenosis related to circumferential mild disc bulge and moderate to severe facet and ligament flavum hypertrophy.  Mild bilateral lateral recess and left greater than right L4 foraminal stenosis.  L5-S1:  Grade 1 spondylolisthesis.  Broad-based disc/pseudo disc bulge.  Severe facet degeneration and hypertrophy.   Moderate ligament flavum hypertrophy.  Mild to moderate  spinal stenosis results.  Mild left greater than right lateral recess stenosis. Mild to moderate L5 foraminal stenosis.  S1-S2:  Full size disc space.  Negative disc.  Moderate facet hypertrophy.  No significant stenosis.  IMPRESSION: 1.  Transitional lumbosacral anatomy, lumbarized S1 level and full size S1-S2 disc. Correlation with radiographs is recommended prior to any operative intervention. 2.  Moderate multifactorial spinal stenosis at L4-L5 with mild lateral recess and left greater than right L4 foraminal stenosis. 3.  Mild to moderate multifactorial spinal stenosis at L5-S1 in the setting of spondylolisthesis and advanced facet degeneration.  Mild lateral recess and mild to moderate bilateral L5 foraminal stenosis. 4.  Chronic multicystic renal disease, grossly stable since 2009.  Original Report Authenticated By: Harley Hallmark, M.D.   Mr Lumbar Spine Wo Contrast  03/18/2012  *RADIOLOGY REPORT*  Clinical Data:  78 year old male with weakness.  Recent bladder infection.  Comparison: CT abdomen and pelvis 08/27/2007.  MRI THORACIC SPINE WITHOUT CONTRAST  Technique: Multiplanar and multiecho pulse sequences of the thoracic spine were obtained without intravenous contrast.  Findings:  Limited sagittal imaging of the cervical spine is remarkable for chronic lower cervical disc degeneration.  No definite significant cervical spinal stenosis.  Exaggerated thoracic kyphosis, but otherwise normal vertebral height alignment.  Normal thoracic marrow signal.  Heterogeneous increased T2 and STIR signal in lower thoracic disc (T9-T10 through T11-T12), but no reactive marrow changes, paraspinal or epidural inflammation.  Occasional benign vertebral body hemangiomas (T10).  No thoracic spinal stenosis.  Occasional small thoracic disc protrusions, and intermittent facet degeneration congruent with age. Spinal cord signal is within normal limits at all visualized  levels.  Conus medullaris better demonstrated on the lumbar section below.  Negative visualized thoracic viscera.  Upper abdominal viscera remarkable for multiple large renal cysts, more so on the left.  IMPRESSION: 1.  No acute or significant findings in the thoracic spine.  Age congruent degenerative changes. 2.  Lumbar findings are below.  MRI LUMBAR SPINE WITHOUT CONTRAST  Technique: Multiplanar and multiecho pulse sequences of the lumbar spine were obtained without intravenous contrast.  Findings:  Transitional lumbosacral anatomy when utilizing the thoracic numbering system which is correlated to the cervical spine.  The S1 level with lumbarized. Correlation with radiographs is recommended prior to any operative intervention.  Mild grade 1 anterolisthesis of L5 on S1 (4 mm) associated with disc and facet degeneration detailed below.  Otherwise normal lumbar vertebral height and alignment. No marrow edema or evidence of acute osseous abnormality.  Negative visualized sacrum.  Multiple large to a very large bilateral renal cystic lesions, visualized portions are stable from the 2009 comparison.  No retroperitoneal lymphadenopathy.  Posterior paraspinal soft tissues are within normal limits.   Visualized lower thoracic spinal cord is normal with conus medularis at L1-L2.  T12-L1:  Negative.  L1-L2:  Negative.  L2-L3:  Minimal disc bulge.  Moderate facet hypertrophy.  No significant stenosis.  L3-L4:  Mild disc bulge.  Moderate facet hypertrophy.  No significant stenosis.  L4-L5:  Moderate spinal stenosis related to circumferential mild disc bulge and moderate to severe facet and ligament flavum hypertrophy.  Mild bilateral lateral recess and left greater than right L4 foraminal stenosis.  L5-S1:  Grade 1 spondylolisthesis.  Broad-based disc/pseudo disc bulge.  Severe facet degeneration and hypertrophy.  Moderate ligament flavum hypertrophy.  Mild to moderate spinal stenosis results.  Mild left greater than right  lateral recess stenosis. Mild to moderate L5 foraminal stenosis.  S1-S2:  Full  size disc space.  Negative disc.  Moderate facet hypertrophy.  No significant stenosis.  IMPRESSION: 1.  Transitional lumbosacral anatomy, lumbarized S1 level and full size S1-S2 disc. Correlation with radiographs is recommended prior to any operative intervention. 2.  Moderate multifactorial spinal stenosis at L4-L5 with mild lateral recess and left greater than right L4 foraminal stenosis. 3.  Mild to moderate multifactorial spinal stenosis at L5-S1 in the setting of spondylolisthesis and advanced facet degeneration.  Mild lateral recess and mild to moderate bilateral L5 foraminal stenosis. 4.  Chronic multicystic renal disease, grossly stable since 2009.  Original Report Authenticated By: Harley Hallmark, M.D.   Dg Chest Portable 1 View  03/18/2012  *RADIOLOGY REPORT*  Clinical Data: Weakness.  Fever.  PORTABLE CHEST - 1 VIEW  Comparison: 01/21/2012  Findings: 1039 hours on The cardiopericardial silhouette is enlarged. Interstitial markings are diffusely coarsened with chronic features. There is pulmonary vascular congestion without overt pulmonary edema.  No focal airspace consolidation or overt airspace pulmonary edema. Imaged bony structures of the thorax are intact. Telemetry leads overlie the chest.  IMPRESSION: Cardiomegaly with vascular congestion superimposed on chronic underlying interstitial changes.  Original Report Authenticated By: ERIC A. MANSELL, M.D.   Other results: EKG: Atrial fibrillation. LAD, consider left anterior fascicular block, prolonged QT interval (503 ms).  Assessment & Plan by Problem:  1. Bilateral leg weakness: Mr. Glasscock describes a chronic process of up to 6 months of progressive weakness with decreased DTRs and no loss of sensation. Ankle strength remains in tact. Brain and spinal imaging are negative for acute infarcts or mass lesions that may be leading to his weakness. Inflammatory myositis  is less likely with a normal CK. DDx of his lower extremity weakness include Deconditioning, Parkinson's, or neuromuscular dysfunction such as Guillain Barre syndrome. The patient notes decreasing activity over the past 6 months which may have led to muscular atrophy. There have been cases of Parkinson's where lower extremity strength, particularly in the hips, can become impaired. However, it is unclear if Parkinson's can cause a clinical picture as serious as Mr. Taubman.Margarita Mail is characterized by an ascending paralysis usually after an infectious process. Generally, onset of symptoms progresses over a period of about two weeks. Disease progression for more than eight weeks is consistent with the diagnosis of chronic inflammatory demyelinating polyradiculoneuropathy (CIDP). This patient's presentation would not be consistent with a typical picture of GBS. - Neurology consulted - PT/OT consult - Provide 2L [place] PRN O2 sats < 92% -Will treat symptomatically, monitor his respiratory status closely -will discuss LP with attending in AM  2. Aortic Stenosis: severe: Mr. Candelas has not had SOB and only has chest pain in stressful situations. His only syncopal episode in the past year was over a month ago while his wife was in the hospital.  - Per patient's family request- We tried to contact Dr. [name] but he is currently on vacation - Continue warfarin dosing as directed - AM INR check - AM EKG  3. Schizophrenia: The patient is currently taking Invega, which can cause QT prolongation. His current QTc is 503. Of note, his family says he will refuse the treatement unless he is told he is being given a "vitamin." -Hold Invega for now given prolonged QTc>569ms  4. Gluteal Abscess, stage 2: ED Physician has drained the abscess. -AM CBC - consult wound care for management and recommendations -wound culture pending  5. HTN: BP is actually on soft side.  Will hold Captopril, Lasix, and  decrease  Metoprolol to 12.5mg  qd for now. - Q4H vitals, strict Is/Os  6. History of TIA: Mr. Fullam describes having intermittent left sided weakness and a previous diagnosis of TIA within the last few months. No left sided weakeness or acute neuro deficits outside of lower extremity weakness today. - Continue ASA 81 mg PO QDay  7. Hypothyroidism:  -Will check TSH -Will continue Levothyroxine  8. Atrial Fibrillation: -Continue warfarin tx per pharmacy recommendations  9. Bilateral PE: Patient was unsure of this diagnosis. INR within a stable range, will continue warfarin therapy  10. FEN/GI: Patient describes having difficulty with BMs unless he has a daily laxative. - Provide miralax QDay for constipation - Zofran 4 mg PO/SubQ Q4H PRN nausea - Pantoprazole 40 mg PO QDay - Sucralfate tab 1 g PO BID - Regular diet - AM BMP  DVT ppx: warfarin per pharmacy  Signed: Raequon Catanzaro 03/18/2012, 6:58 PM

## 2012-03-18 NOTE — ED Notes (Signed)
MD at bedside. 

## 2012-03-18 NOTE — ED Notes (Signed)
Admitting MD at bedside.

## 2012-03-18 NOTE — ED Notes (Signed)
Pt transported back to ED from MRI on stretcher with tech, tolerated well.

## 2012-03-18 NOTE — ED Provider Notes (Signed)
History     CSN: [id number]  Arrival date & time 03/18/12  4098   First MD Initiated Contact with Patient 03/18/12 1005      No chief complaint on file.   (Consider location/radiation/quality/duration/timing/severity/associated sxs/prior treatment) Patient is a 78 y.o. male presenting with weakness. The history is provided by the patient and a relative.  Weakness  Additional symptoms include weakness.  He has been having progressive weakness over the last 2 weeks. Weakness has only been affecting his legs-he has had normal use of his arms. Weakness has gotten so severe that he cannot stand up. He's also had some urinary incontinence. Family has noted that he is slightly more lethargic than normal. He has not had any fever, chills, sweats he. He has not had any falls. He denies chest pain, heaviness, tightness, pressure. He denies dyspnea. There's been no nausea or vomiting. He was treated for a urinary tract infection with Cipro-this was started one week ago. Also, family has noted a red spot on his left buttock which seems to be getting worse. There has been some slight drainage from the spot.  Past Medical History  Diagnosis Date  . Pulmonary embolus     bilateral  . Hypothyroid   . Hypertension   . CAD (coronary artery disease)   . Chronic atrial fibrillation     INR therpeutic on Coumadin  . Dementia     hallucinations status post psychiatric evaluation during admission  . Kidney cyst, acquired   . A-fib   . CAD (coronary artery disease)     cath 2006, no stents, ejection fraction  . Leukocytosis   . Tremor   . Parkinson disease     Past Surgical History  Procedure Date  . Mole removakl     Family History  Problem Relation Age of Onset  . Heart failure Mother   . Microcephaly Father     History  Substance Use Topics  . Smoking status: Former Games developer  . Smokeless tobacco: Not on file  . Alcohol Use: No      Review of Systems  Neurological: Positive for  weakness.  All other systems reviewed and are negative.    Allergies  Morphine and related; Nifedipine; and Nitroglycerin  Home Medications   Current Outpatient Rx  Name Route Sig Dispense Refill  . ACETAMINOPHEN ER 650 MG PO TBCR Oral Take 650 mg by mouth. Take 2 tabs AM and 1 tab PM     . ALLOPURINOL 300 MG PO TABS Oral Take 300 mg by mouth daily.      . ASPIRIN 81 MG PO CHEW Oral Chew 81 mg by mouth daily.    Marland Kitchen CAPTOPRIL 25 MG PO TABS Oral Take 25 mg by mouth 2 (two) times daily. As directed      . CIPRO PO Oral Take by mouth 2 times daily at 12 noon and 4 pm.    . VITAMIN B-12 IJ Injection Inject as directed every 30 (thirty) days.    . FUROSEMIDE 80 MG PO TABS Oral Take 80 mg by mouth daily.      Marland Kitchen LEVOTHYROXINE SODIUM 175 MCG PO TABS Oral Take 175 mcg by mouth daily.      Marland Kitchen LORAZEPAM 1 MG PO TABS Oral Take 1 mg by mouth as needed. For anxiety    . METOPROLOL SUCCINATE ER 50 MG PO TB24 Oral Take 50 mg by mouth daily.      . OSTEO BI-FLEX ADV DOUBLE ST PO Oral Take  by mouth. Take 2 tabs daily     . FISH OIL 1000 MG PO CAPS Oral Take by mouth. Take 1 capsule by mouth two times a day     . OMEPRAZOLE 20 MG PO TBEC Oral Take by mouth. 1 tablet once a day     . PRAVASTATIN SODIUM 10 MG PO TABS Oral Take 10 mg by mouth daily. Take 2 tabs daily     . SUCRALFATE 1 G PO TABS Oral Take 1 g by mouth 2 (two) times daily.      . WARFARIN SODIUM 5 MG PO TABS Oral Take 5 mg by mouth at bedtime.     Marland Kitchen PALIPERIDONE ER 3 MG PO TB24 Oral Take 3 mg by mouth daily.        BP 112/71  Pulse 102  Temp 99.2 F (37.3 C) (Oral)  Resp 20  SpO2 97%  Physical Exam  Nursing note and vitals reviewed.  78year old male, resting comfortably and in no acute distress. Vital signs are significant for mild tachycardia with heart rate of 102. Oxygen saturation is 97%, which is normal. Head is normocephalic and atraumatic. PERRLA, EOMI. Oropharynx is clear. Neck is nontender and supple without adenopathy or  JVD. Back is nontender and there is no CVA tenderness. Lungs are clear without rales, wheezes, or rhonchi. Chest is nontender. Heart has an irregular rhythm with a 3/6 systolic ejection murmur heard throughout the precordium. Abdomen is soft, flat, nontender without masses or hepatosplenomegaly and peristalsis is normoactive. Extremities have trace edema, no cyanosis. Venous stasis changes are present. Full passive range of motion is present. Skin is warm and dry. There is an abscess on the left gluteal area with erythema of the overlying skin. Neurologic: He is awake, alert, oriented. Speech is slightly slow but content is appropriate and there is no dysarthria. Cranial nerves are intact. Arm strength is 5/5 in all tested muscle groups. Leg strength is 3/5 in all tested muscle groups. He has decreased sensation in his left lower leg and foot, no other sensory deficits.  ED Course  Procedures (including critical care time)  Results for orders placed during the hospital encounter of 03/18/12  CBC WITH DIFFERENTIAL      Component Value Range   WBC 7.5  4.0 - 10.5 K/uL   RBC 3.71 (*) 4.22 - 5.81 MIL/uL   Hemoglobin 12.5 (*) 13.0 - 17.0 g/dL   HCT 16.1 (*) 09.6 - 04.5 %   MCV 99.5  78.0 - 100.0 fL   MCH 33.7  26.0 - 34.0 pg   MCHC 33.9  30.0 - 36.0 g/dL   RDW 40.9  81.1 - 91.4 %   Platelets 149 (*) 150 - 400 K/uL   Neutrophils Relative 66  43 - 77 %   Neutro Abs 4.9  1.7 - 7.7 K/uL   Lymphocytes Relative 17  12 - 46 %   Lymphs Abs 1.3  0.7 - 4.0 K/uL   Monocytes Relative 13 (*) 3 - 12 %   Monocytes Absolute 1.0  0.1 - 1.0 K/uL   Eosinophils Relative 3  0 - 5 %   Eosinophils Absolute 0.2  0.0 - 0.7 K/uL   Basophils Relative 1  0 - 1 %   Basophils Absolute 0.1  0.0 - 0.1 K/uL  COMPREHENSIVE METABOLIC PANEL      Component Value Range   Sodium 140  135 - 145 mEq/L   Potassium 3.6  3.5 - 5.1 mEq/L  Chloride 101  96 - 112 mEq/L   CO2 31  19 - 32 mEq/L   Glucose, Bld 109 (*) 70 - 99  mg/dL   BUN 22  6 - 23 mg/dL   Creatinine, Ser 2.84  0.50 - 1.35 mg/dL   Calcium 9.0  8.4 - 13.2 mg/dL   Total Protein 6.7  6.0 - 8.3 g/dL   Albumin 3.2 (*) 3.5 - 5.2 g/dL   AST 15  0 - 37 U/L   ALT 8  0 - 53 U/L   Alkaline Phosphatase 86  39 - 117 U/L   Total Bilirubin 0.8  0.3 - 1.2 mg/dL   GFR calc non Af Amer 58 (*) >90 mL/min   GFR calc Af Amer 67 (*) >90 mL/min  URINALYSIS, ROUTINE W REFLEX MICROSCOPIC      Component Value Range   Color, Urine YELLOW  YELLOW   APPearance CLEAR  CLEAR   Specific Gravity, Urine 1.020  1.005 - 1.030   pH 6.0  5.0 - 8.0   Glucose, UA NEGATIVE  NEGATIVE mg/dL   Hgb urine dipstick NEGATIVE  NEGATIVE   Bilirubin Urine NEGATIVE  NEGATIVE   Ketones, ur NEGATIVE  NEGATIVE mg/dL   Protein, ur NEGATIVE  NEGATIVE mg/dL   Urobilinogen, UA 1.0  0.0 - 1.0 mg/dL   Nitrite NEGATIVE  NEGATIVE   Leukocytes, UA NEGATIVE  NEGATIVE  CK      Component Value Range   Total CK 29  7 - 232 U/L  PROTIME-INR      Component Value Range   Prothrombin Time 29.0 (*) 11.6 - 15.2 seconds   INR 2.69 (*) 0.00 - 1.49  TROPONIN I      Component Value Range   Troponin I <0.30  <0.30 ng/mL  MAGNESIUM      Component Value Range   Magnesium 2.2  1.5 - 2.5 mg/dL   Ct Head Wo Contrast  03/18/2012  *RADIOLOGY REPORT*  Clinical Data: Weakness.  CT HEAD WITHOUT CONTRAST  Technique:  Contiguous axial images were obtained from the base of the skull through the vertex without contrast.  Comparison: 11/26/2011.  Findings: No intracranial hemorrhage.  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  No intracranial mass lesion detected on this unenhanced exam.  Global atrophy.  Ventricular prominence unchanged felt to be related to atrophy rather than hydrocephalus.  Vascular calcifications.  Complete opacification right maxillary sinus.  Mucosal thickening/partial opacification  ethmoid sinus air cells.  IMPRESSION: No intracranial hemorrhage.  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  Global atrophy.  Complete opacification right maxillary sinus.  Mucosal thickening/partial opacification ethmoid sinus air cells.  Original Report Authenticated By: Fuller Canada, M.D.   Mr Thoracic Spine Wo Contrast  03/18/2012  *RADIOLOGY REPORT*  Clinical Data:  78 year old male with weakness.  Recent bladder infection.  Comparison: CT abdomen and pelvis 08/27/2007.  MRI THORACIC SPINE WITHOUT CONTRAST  Technique: Multiplanar and multiecho pulse sequences of the thoracic spine were obtained without intravenous contrast.  Findings:  Limited sagittal imaging of the cervical spine is remarkable for chronic lower cervical disc degeneration.  No definite significant cervical spinal stenosis.  Exaggerated thoracic kyphosis, but otherwise normal vertebral height alignment.  Normal thoracic marrow signal.  Heterogeneous increased T2 and STIR signal in lower  thoracic disc (T9-T10 through T11-T12), but no reactive marrow changes, paraspinal or epidural inflammation.  Occasional benign vertebral body hemangiomas (T10).  No thoracic spinal stenosis.  Occasional small thoracic disc protrusions, and intermittent facet degeneration congruent with age. Spinal cord signal is within normal limits at all visualized levels.  Conus medullaris better demonstrated on the lumbar section below.  Negative visualized thoracic viscera.  Upper abdominal viscera remarkable for multiple large renal cysts, more so on the left.  IMPRESSION: 1.  No acute or significant findings in the thoracic spine.  Age congruent degenerative changes. 2.  Lumbar findings are below.  MRI LUMBAR SPINE WITHOUT CONTRAST  Technique: Multiplanar and multiecho pulse sequences of the lumbar spine were obtained without intravenous contrast.   Findings:  Transitional lumbosacral anatomy when utilizing the thoracic numbering system which is correlated to the cervical spine.  The S1 level with lumbarized. Correlation with radiographs is recommended prior to any operative intervention.  Mild grade 1 anterolisthesis of L5 on S1 (4 mm) associated with disc and facet degeneration detailed below.  Otherwise normal lumbar vertebral height and alignment. No marrow edema or evidence of acute osseous abnormality.  Negative visualized sacrum.  Multiple large to a very large bilateral renal cystic lesions, visualized portions are stable from the 2009 comparison.  No retroperitoneal lymphadenopathy.  Posterior paraspinal soft tissues are within normal limits.   Visualized lower thoracic spinal cord is normal with conus medularis at L1-L2.  T12-L1:  Negative.  L1-L2:  Negative.  L2-L3:  Minimal disc bulge.  Moderate facet hypertrophy.  No significant stenosis.  L3-L4:  Mild disc bulge.  Moderate facet hypertrophy.  No significant stenosis.  L4-L5:  Moderate spinal stenosis related to circumferential mild disc bulge and moderate to severe facet and ligament flavum hypertrophy.  Mild bilateral lateral recess and left greater than right L4 foraminal stenosis.  L5-S1:  Grade 1 spondylolisthesis.  Broad-based disc/pseudo disc bulge.  Severe facet degeneration and hypertrophy.  Moderate ligament flavum hypertrophy.  Mild to moderate spinal stenosis results.  Mild left greater than right lateral recess stenosis. Mild to moderate L5 foraminal stenosis.  S1-S2:  Full size disc space.  Negative disc.  Moderate facet hypertrophy.  No significant stenosis.  IMPRESSION: 1.  Transitional lumbosacral anatomy, lumbarized S1 level and full size S1-S2 disc. Correlation with radiographs is recommended prior to any operative intervention. 2.  Moderate multifactorial spinal stenosis at L4-L5 with mild lateral recess and left greater than right L4 foraminal stenosis. 3.  Mild to moderate  multifactorial spinal stenosis at L5-S1 in the setting of spondylolisthesis and advanced facet degeneration.  Mild lateral recess and mild to moderate bilateral L5 foraminal stenosis. 4.  Chronic multicystic renal disease, grossly stable since 2009.  Original Report Authenticated By: Harley Hallmark, M.D.   Mr Lumbar Spine Wo Contrast  03/18/2012  *RADIOLOGY REPORT*  Clinical Data:  78 year old male with weakness.  Recent bladder infection.  Comparison: CT abdomen and pelvis 08/27/2007.  MRI THORACIC SPINE WITHOUT CONTRAST  Technique: Multiplanar and multiecho pulse sequences of the thoracic spine were obtained without intravenous contrast.  Findings:  Limited sagittal imaging of the cervical spine is remarkable for chronic lower cervical disc degeneration.  No definite significant cervical spinal stenosis.  Exaggerated thoracic kyphosis, but otherwise normal vertebral height alignment.  Normal thoracic marrow signal.  Heterogeneous increased T2 and STIR signal in lower thoracic disc (T9-T10 through T11-T12), but no reactive marrow changes, paraspinal or epidural inflammation.  Occasional benign vertebral body hemangiomas (T10).  No thoracic spinal stenosis.  Occasional small thoracic disc protrusions, and intermittent facet degeneration congruent with age. Spinal cord signal is within normal limits at all visualized levels.  Conus medullaris better demonstrated on the lumbar section below.  Negative visualized thoracic viscera.  Upper abdominal viscera remarkable for multiple large renal cysts, more so on the left.  IMPRESSION: 1.  No acute or significant findings in the thoracic spine.  Age congruent degenerative changes. 2.  Lumbar findings are below.  MRI LUMBAR SPINE WITHOUT CONTRAST  Technique: Multiplanar and multiecho pulse sequences of the lumbar spine were obtained without intravenous contrast.  Findings:  Transitional lumbosacral anatomy when utilizing the thoracic numbering system which is correlated  to the cervical spine.  The S1 level with lumbarized. Correlation with radiographs is recommended prior to any operative intervention.  Mild grade 1 anterolisthesis of L5 on S1 (4 mm) associated with disc and facet degeneration detailed below.  Otherwise normal lumbar vertebral height and alignment. No marrow edema or evidence of acute osseous abnormality.  Negative visualized sacrum.  Multiple large to a very large bilateral renal cystic lesions, visualized portions are stable from the 2009 comparison.  No retroperitoneal lymphadenopathy.  Posterior paraspinal soft tissues are within normal limits.   Visualized lower thoracic spinal cord is normal with conus medularis at L1-L2.  T12-L1:  Negative.  L1-L2:  Negative.  L2-L3:  Minimal disc bulge.  Moderate facet hypertrophy.  No significant stenosis.  L3-L4:  Mild disc bulge.  Moderate facet hypertrophy.  No significant stenosis.  L4-L5:  Moderate spinal stenosis related to circumferential mild disc bulge and moderate to severe facet and ligament flavum hypertrophy.  Mild bilateral lateral recess and left greater than right L4 foraminal stenosis.  L5-S1:  Grade 1 spondylolisthesis.  Broad-based disc/pseudo disc bulge.  Severe facet degeneration and hypertrophy.  Moderate ligament flavum hypertrophy.  Mild to moderate spinal stenosis results.  Mild left greater than right lateral recess stenosis. Mild to moderate L5 foraminal stenosis.  S1-S2:  Full size disc space.  Negative disc.  Moderate facet hypertrophy.  No significant stenosis.  IMPRESSION: 1.  Transitional lumbosacral anatomy, lumbarized S1 level and full size S1-S2 disc. Correlation with radiographs is recommended prior to any operative intervention. 2.  Moderate multifactorial spinal stenosis at L4-L5 with mild lateral recess and left greater than right L4 foraminal stenosis. 3.  Mild to moderate multifactorial spinal stenosis at L5-S1 in the setting of spondylolisthesis and advanced facet degeneration.   Mild lateral recess and mild to moderate bilateral L5 foraminal stenosis. 4.  Chronic multicystic renal disease, grossly stable since 2009.  Original Report Authenticated By: Harley Hallmark, M.D.   Dg Chest Portable 1 View  03/18/2012  *RADIOLOGY REPORT*  Clinical Data: Weakness.  Fever.  PORTABLE CHEST - 1 VIEW  Comparison: 01/21/2012  Findings: 1039 hours on The cardiopericardial silhouette is enlarged. Interstitial markings are diffusely coarsened with chronic features. There is pulmonary vascular congestion without overt pulmonary edema.  No focal airspace consolidation or overt airspace pulmonary edema. Imaged bony structures of the thorax are intact. Telemetry leads overlie the chest.  IMPRESSION: Cardiomegaly with vascular congestion superimposed on chronic underlying interstitial changes.  Original Report Authenticated By: ERIC A. MANSELL, M.D.      Date: 03/18/2012  Rate: 111  Rhythm: atrial fibrillation  QRS Axis: left  Intervals: QT prolonged  ST/T Wave abnormalities: normal  Conduction Disutrbances:left anterior fascicular block  Narrative Interpretation: Atrial fibrillation with rapid ventricular response, left axis deviation from left anterior  hemiblock, prolonged QT interval. When compared with ECG of 01/21/2012, QT interval has lengthened.  Old EKG Reviewed: changes noted    1. Bilateral leg weakness   2. Abscess of buttock, left       MDM  Progressive leg weakness with urinary incontinence is very concerning for possible spinal cord injury. He is on warfarin so he would be at risk for spontaneous bleeding. Also consider entities such as Guillain-Barr syndrome. He is an abscess which will need incision and drainage, but this seems unlikely to be a source of his problem. Prior records are reviewed and he had been at limited to the hospital 2 months ago for an episode of syncope.  MRI scans did not show evidence of spinal cord injury or compression. At this point, his leg  weakness seems most likely to be in do to her primary neurologic problem such as Guillain-Barr. Abscess was incised and drained by Jaci Carrel PA-C. Case was discussed with resident on call for outpatient clinics who agrees to admit the patient.      Dione Booze, MD 03/18/12 [phone]

## 2012-03-19 DIAGNOSIS — L03317 Cellulitis of buttock: Secondary | ICD-10-CM

## 2012-03-19 DIAGNOSIS — R5381 Other malaise: Secondary | ICD-10-CM

## 2012-03-19 DIAGNOSIS — L0231 Cutaneous abscess of buttock: Secondary | ICD-10-CM

## 2012-03-19 DIAGNOSIS — F209 Schizophrenia, unspecified: Secondary | ICD-10-CM

## 2012-03-19 DIAGNOSIS — D539 Nutritional anemia, unspecified: Secondary | ICD-10-CM | POA: Diagnosis present

## 2012-03-19 DIAGNOSIS — I359 Nonrheumatic aortic valve disorder, unspecified: Secondary | ICD-10-CM

## 2012-03-19 DIAGNOSIS — R29898 Other symptoms and signs involving the musculoskeletal system: Secondary | ICD-10-CM

## 2012-03-19 HISTORY — DX: Nutritional anemia, unspecified: D53.9

## 2012-03-19 LAB — BASIC METABOLIC PANEL
GFR calc Af Amer: 76 mL/min — ABNORMAL LOW (ref >90)
GFR calc non Af Amer: 66 mL/min — ABNORMAL LOW (ref >90)
Potassium: 3.7 mEq/L (ref 3.5–5.1)
Sodium: 138 mEq/L (ref 135–145)

## 2012-03-19 LAB — PROTIME-INR
INR: 2.23 — ABNORMAL HIGH (ref 0.00–1.49)
Prothrombin Time: 25.1 seconds — ABNORMAL HIGH (ref 11.6–15.2)

## 2012-03-19 LAB — CBC
Hemoglobin: 11.6 g/dL — ABNORMAL LOW (ref 13.0–17.0)
MCHC: 33.3 g/dL (ref 30.0–36.0)
RBC: 3.51 MIL/uL — ABNORMAL LOW (ref 4.22–5.81)
WBC: 8.6 10*3/uL (ref 4.0–10.5)

## 2012-03-19 MED ORDER — CARBIDOPA-LEVODOPA CR 25-100 MG PO TBCR
1.0000 | EXTENDED_RELEASE_TABLET | Freq: Two times a day (BID) | ORAL | Status: DC
  Administered 2012-03-19 – 2012-03-20 (×2): 1 via ORAL
  Filled 2012-03-19 (×4): qty 1

## 2012-03-19 MED ORDER — METOPROLOL TARTRATE 12.5 MG HALF TABLET
12.5000 mg | ORAL_TABLET | Freq: Once | ORAL | Status: AC
  Administered 2012-03-19: 12.5 mg via ORAL
  Filled 2012-03-19 (×2): qty 1

## 2012-03-19 MED ORDER — FUROSEMIDE 40 MG PO TABS
40.0000 mg | ORAL_TABLET | Freq: Every day | ORAL | Status: DC
  Administered 2012-03-19 – 2012-03-29 (×11): 40 mg via ORAL
  Filled 2012-03-19 (×11): qty 1

## 2012-03-19 NOTE — Progress Notes (Addendum)
Patient experienced non sustained 7 beat run of V-tach at 0011, while asleep.  Vitals Temp 98.2, BP 126/61, HR 99, RR 18, pulse ox at 95% on room air.  No s/s of distress, patients is asymptomatic.  Spoke with Dr. Lavena Bullion, no new orders at this time, strips placed on chart for MD rounding in am.  Will continue to monitor.

## 2012-03-19 NOTE — Evaluation (Signed)
Occupational Therapy Evaluation Patient Details Name: [name] Graham MRN: [id number] DOB: 1929-04-30 Today's Date: 03/19/2012 Time: 4098-1191 OT Time Calculation (min): 44 min  OT Assessment / Plan / Recommendation Clinical Impression  Pt admitted with increasing weakness and inability to ambulate.  Pt has multiple comorbidities including Parkinson's Disease.  Pt will benefit from acute OT to address self care and mobility for ADL.  Pt  currently requires +2 assist for mobility and will likely need post acute rehab.  Recommend CIR consult.      OT Assessment  Patient needs continued OT Services    Follow Up Recommendations  Inpatient Rehab    Barriers to Discharge      Equipment Recommendations  Defer to next venue    Recommendations for Other Services Rehab consult  Frequency  Min 2X/week    Precautions / Restrictions Precautions Precautions: Fall Restrictions Weight Bearing Restrictions: No   Pertinent Vitals/Pain No pain    ADL  Eating/Feeding: Independent;Simulated Where Assessed - Eating/Feeding: Bed level Grooming: Performed;Wash/dry face;Set up Where Assessed - Grooming: Unsupported sitting Upper Body Bathing: Performed;Maximal assistance Where Assessed - Upper Body Bathing: Unsupported sitting Lower Body Bathing: Performed;+1 Total assistance Where Assessed - Lower Body Bathing: Supported sit to stand Upper Body Dressing: Performed;Moderate assistance Where Assessed - Upper Body Dressing: Unsupported sitting Lower Body Dressing: Performed;+1 Total assistance Where Assessed - Lower Body Dressing: Supported sit to stand Equipment Used: Gait belt Transfers/Ambulation Related to ADLs: Transfer deferred due to safety concerns.  Pt unable to ambulate. ADL Comments: Pt performs UB ADL very slowly.      OT Diagnosis: Generalized weakness;Cognitive deficits  OT Problem List: Impaired balance (sitting and/or standing);Decreased cognition;Decreased knowledge of use of  DME or AE;Impaired UE functional use;Decreased strength OT Treatment Interventions: Self-care/ADL training;DME and/or AE instruction;Therapeutic activities;Balance training;Patient/family education   OT Goals Acute Rehab OT Goals OT Goal Formulation: With patient Time For Goal Achievement: 04/02/12 Potential to Achieve Goals: Good ADL Goals Pt Will Perform Grooming: with supervision;Sitting, edge of bed ADL Goal: Grooming - Progress: Goal set today Pt Will Perform Upper Body Bathing: with min assist;Sitting, edge of bed ADL Goal: Upper Body Bathing - Progress: Goal set today Pt Will Perform Upper Body Dressing: with supervision;Sitting, bed ADL Goal: Upper Body Dressing - Progress: Goal set today Pt Will Transfer to Toilet: with mod assist;3-in-1;with DME ADL Goal: Toilet Transfer - Progress: Goal set today Miscellaneous OT Goals Miscellaneous OT Goal #1: Pt will perform bed mobility with min assist to EOB in prep for ADL. OT Goal: Miscellaneous Goal #1 - Progress: Goal set today  Visit Information  Last OT Received On: 03/19/12 Assistance Needed: +2 PT/OT Co-Evaluation/Treatment: Yes    Subjective Data  Subjective: "My wife is in intensive care." Patient Stated Goal: Get better.   Prior Functioning  Vision/Perception  Home Living Lives With: Daughter;Other (Comment) (and son in law moved in to pt's home) Available Help at Discharge: Family;Available PRN/intermittently Type of Home: Mobile home Home Access: Ramped entrance Home Layout: One level Bathroom Shower/Tub: Tub/shower unit;Curtain Bathroom Toilet: Handicapped height Home Adaptive Equipment: Straight cane;Grab bars in shower;Hand-held shower hose;Tub transfer bench;Wheelchair - powered Prior Function Level of Independence: Independent with assistive device(s) Driving: Yes Vocation: Retired Musician: No difficulties Dominant Hand: Right      Cognition  Overall Cognitive Status:  Impaired Area of Impairment: Memory Arousal/Alertness: Awake/alert Orientation Level: Appears intact for tasks assessed Behavior During Session: WFL for tasks performed Memory:  (Pt inaccurately reported his home situation and  DME.)    Extremity/Trunk Assessment Right Upper Extremity Assessment RUE ROM/Strength/Tone: Deficits RUE ROM/Strength/Tone Deficits: Pt with moderate, inconsistent tremor.  Daughter reports tremor being worse with stress. (Pt has good strength.) Left Upper Extremity Assessment LUE ROM/Strength/Tone: Deficits LUE ROM/Strength/Tone Deficits: Pt has an inconsistent moderate tremor, also greater with stress.  Good overall strength.   Mobility Bed Mobility Bed Mobility: Supine to Sit;Sitting - Scoot to [place] of Bed;Sit to Supine;Scooting to [facility] Supine to Sit: 2: Max assist;With rails;HOB flat Sitting - Scoot to Edge of Bed: 3: Mod assist Sit to Supine: 1: +2 Total assist Sit to Supine: Patient Percentage: 30% Scooting to HOB: 1: +2 Total assist Scooting to [facility]: Patient Percentage: 0% Transfers Transfers: Sit to Stand;Stand to Sit Sit to Stand: 1: +2 Total assist;From bed;With upper extremity assist Sit to Stand: Patient Percentage: 60% Stand to Sit: 1: +2 Total assist;To bed;With upper extremity assist Stand to Sit: Patient Percentage: 60% Details for Transfer Assistance: Verbal cues for hand placement and posture.   Exercise    Balance Balance Balance Assessed: Yes Static Sitting Balance Static Sitting - Balance Support: Feet supported Static Sitting - Level of Assistance: 5: Stand by assistance  End of Session OT - End of Session Activity Tolerance: Patient tolerated treatment well Patient left: in bed;with call bell/phone within reach;Other (comment) (nurse tech in room) Nurse Communication: Mobility status  GO     Evern Bio 03/19/2012, 12:32 PM [phone]

## 2012-03-19 NOTE — Evaluation (Signed)
Physical Therapy Evaluation Patient Details Name: [name] Graham MRN: [id number] DOB: July 31, 1929 Today's Date: 03/19/2012 Time: 7846-9629 PT Time Calculation (min): 39 min  PT Assessment / Plan / Recommendation Clinical Impression  Pt admitted with increasing weakness and inability to ambulate. Pt with increased weakness during all functional mobility as he was unable to weight bear for steps. Pt will benefit from skilled PT in the acute care setting in order to maximize functional mobility and safety. Pt would make an excellent ST-Rehab candidate.    PT Assessment  Patient needs continued PT services    Follow Up Recommendations  Inpatient Rehab;Supervision/Assistance - 24 hour    Barriers to Discharge        Equipment Recommendations  Defer to next venue    Recommendations for Other Services Rehab consult   Frequency Min 4X/week    Precautions / Restrictions Precautions Precautions: Fall Restrictions Weight Bearing Restrictions: No         Mobility  Bed Mobility Bed Mobility: Supine to Sit;Sitting - Scoot to [place] of Bed;Sit to Supine;Scooting to [facility] Supine to Sit: 2: Max assist;With rails;HOB flat Sitting - Scoot to Edge of Bed: 3: Mod assist Sit to Supine: 1: +2 Total assist Sit to Supine: Patient Percentage: 30% Scooting to HOB: 1: +2 Total assist Scooting to [facility]: Patient Percentage: 0% Transfers Transfers: Sit to Stand;Stand to Sit Sit to Stand: 1: +2 Total assist;From bed;With upper extremity assist Sit to Stand: Patient Percentage: 60% Stand to Sit: 1: +2 Total assist;To bed;With upper extremity assist Stand to Sit: Patient Percentage: 60% Details for Transfer Assistance: Verbal cues for hand placement and posture. Attempted weight shifting for steps, pt unable to bear weight in either LE to move either forward or sideways Ambulation/Gait Ambulation/Gait Assistance: Not tested (comment)    Exercises     PT Diagnosis: Difficulty walking;Acute  pain;Generalized weakness  PT Problem List: Decreased strength;Decreased activity tolerance;Decreased mobility;Decreased knowledge of use of DME;Decreased safety awareness;Decreased knowledge of precautions;Pain PT Treatment Interventions: DME instruction;Gait training;Functional mobility training;Therapeutic activities;Therapeutic exercise;Balance training;Neuromuscular re-education;Patient/family education   PT Goals Acute Rehab PT Goals PT Goal Formulation: With patient Time For Goal Achievement: 04/02/12 Potential to Achieve Goals: Fair Pt will go Supine/Side to Sit: with min assist PT Goal: Supine/Side to Sit - Progress: Goal set today Pt will go Sit to Supine/Side: with min assist PT Goal: Sit to Supine/Side - Progress: Goal set today Pt will go Sit to Stand: with mod assist PT Goal: Sit to Stand - Progress: Goal set today Pt will go Stand to Sit: with mod assist PT Goal: Stand to Sit - Progress: Goal set today Pt will Transfer Bed to Chair/Chair to Bed: with mod assist PT Transfer Goal: Bed to Chair/Chair to Bed - Progress: Goal set today  Visit Information  Last PT Received On: 03/19/12 Assistance Needed: +2 PT/OT Co-Evaluation/Treatment: Yes    Subjective Data  Patient Stated Goal: to get better and be able to walk   Prior Functioning  Home Living Lives With: Daughter;Other (Comment) (and son in law moved in to pt's home) Available Help at Discharge: Family;Available PRN/intermittently Type of Home: Mobile home Home Access: Ramped entrance Home Layout: One level Bathroom Shower/Tub: Tub/shower unit;Curtain Bathroom Toilet: Handicapped height Home Adaptive Equipment: Straight cane;Grab bars in shower;Hand-held shower hose;Tub transfer bench;Wheelchair - powered Prior Function Level of Independence: Independent with assistive device(s) Driving: Yes Vocation: Retired Musician: No difficulties Dominant Hand: Right    Cognition  Overall Cognitive  Status: Impaired Area of  Impairment: Memory Arousal/Alertness: Awake/alert Orientation Level: Appears intact for tasks assessed Behavior During Session: [facility] for tasks performed Memory:  (Pt inaccurately reported his home situation and DME.)    Extremity/Trunk Assessment Right Upper Extremity Assessment RUE ROM/Strength/Tone: Deficits RUE ROM/Strength/Tone Deficits: Pt with moderate, inconsistent tremor.  Daughter reports tremor being worse with stress. (Pt has good strength.) Left Upper Extremity Assessment LUE ROM/Strength/Tone: Deficits LUE ROM/Strength/Tone Deficits: Pt has an inconsistent moderate tremor, also greater with stress.  Good overall strength.   Balance Balance Balance Assessed: Yes Static Sitting Balance Static Sitting - Balance Support: Feet supported Static Sitting - Level of Assistance: 5: Stand by assistance  End of Session PT - End of Session Equipment Utilized During Treatment: Gait belt Activity Tolerance: Patient limited by fatigue Patient left: in bed;with call bell/phone within reach;with nursing in room;with family/visitor present Nurse Communication: Mobility status    Milana Kidney 03/19/2012, 12:48 PM  03/19/2012 Milana Kidney DPT PAGER: [phone] OFFICE: [id number]

## 2012-03-19 NOTE — Consult Note (Signed)
Consulting Physician:     Chief Complaint:  LE weakness,  Rigidity History of Present Illness:   This is an 78 RHWM  with progressive change in cognition, frequent falls, REM sleep disorder,  Shuffling gait, rigidity, resting tremor, constipation, audio hallucinations and some visual hallucinations ( patient denies, but daughter mentions about ), bladder incontinent,  This has been progressively getting worse since past 3 years. For the past 2 days he had significantly difficulty with ambulation, worsening of rigidity.  According to the daughter ( care giver) patient has become progressively agitated but does not use foul language and there is no inhibited behavior involved., no  Aphasia, there is mild decline in personal hygiene but patient agrees to that he avoids Showers because he is afraid he will get sick with either hot or cold water. There no visua-spatial derangement.  He denies neck pain, no chest pain, no nausea, no vomiting, agrees to confusion that is worse when he wakes up in the morning and bladder incontinent. He has significant trouble buttoning his clothes or  Other things such has opening the door, feeding himself with fork and or spoon.  He is a high risk for fall and given that he is on coumadin for cardiac arrythmia fall could really be catastrophic His tremors get worse with distraction. Has been getting progressively dysphagic to a point that he gets choked.    Past Medical History  Diagnosis Date  . Pulmonary embolus     bilateral  . Hypothyroid   . Hypertension   . CAD (coronary artery disease)   . Chronic atrial fibrillation     INR therpeutic on Coumadin  . Dementia     hallucinations status post psychiatric evaluation during admission  . Kidney cyst, acquired   . A-fib   . CAD (coronary artery disease)     cath 2006, no stents, ejection fraction  . Leukocytosis   . Tremor   . Parkinson disease     Past Surgical History  Procedure Date  . Mole removakl       Family History  Problem Relation Age of Onset  . Heart failure Mother   . Microcephaly Father    Social History:  reports that he has quit smoking. His smoking use included Cigarettes. He has a 24 pack-year smoking history. He does not have any smokeless tobacco history on file. He reports that he does not drink alcohol or use illicit drugs.  Allergies:  Allergies  Allergen Reactions  . Morphine And Related     Goes Crazy  . Nifedipine Other (See Comments)    "makes me go crazy"  . Nitroglycerin     Medications Prior to Admission  Medication Sig Dispense Refill  . acetaminophen (ARTHRITIS PAIN RELIEF) 650 MG CR tablet Take 650 mg by mouth. Take 2 tabs AM and 1 tab PM       . allopurinol (ZYLOPRIM) 300 MG tablet Take 300 mg by mouth daily.        Marland Kitchen aspirin 81 MG chewable tablet Chew 81 mg by mouth daily.      . captopril (CAPOTEN) 25 MG tablet Take 25 mg by mouth 2 (two) times daily. As directed        . Ciprofloxacin (CIPRO PO) Take by mouth 2 times daily at 12 noon and 4 pm.       . Cyanocobalamin (VITAMIN B-12 IJ) Inject as directed every 30 (thirty) days.      . furosemide (LASIX) 80 MG tablet  Take 80 mg by mouth daily.        Marland Kitchen levothyroxine (SYNTHROID) 175 MCG tablet Take 175 mcg by mouth daily.        Marland Kitchen LORazepam (ATIVAN) 1 MG tablet Take 1 mg by mouth as needed. For anxiety      . metoprolol (TOPROL XL) 50 MG 24 hr tablet Take 50 mg by mouth daily.        . Misc Natural Products (OSTEO BI-FLEX ADV DOUBLE ST PO) Take by mouth. Take 2 tabs daily       . Omega-3 Fatty Acids (FISH OIL) 1000 MG CAPS Take by mouth. Take 1 capsule by mouth two times a day       . Omeprazole 20 MG TBEC Take by mouth. 1 tablet once a day       . pravastatin (PRAVACHOL) 10 MG tablet Take 10 mg by mouth daily. Take 2 tabs daily       . sucralfate (CARAFATE) 1 G tablet Take 1 g by mouth 2 (two) times daily.        Marland Kitchen warfarin (COUMADIN) 2.5 MG tablet Take 2.5-5 mg by mouth daily. Takes 2.5mg  (1  tablet) daily except 5mg  (2 tablets) on Monday, Wednesday, and Friday      . paliperidone (INVEGA) 3 MG 24 hr tablet Take 3 mg by mouth daily.          ROS: All 14 point review of systems were reviewed and pertinent ones are mentioned in the HPI  Physical Examination: Blood pressure 125/68, pulse 88, temperature 97.5 F (36.4 C), temperature source Oral, resp. rate 16, weight 123.8 kg (272 lb 14.9 oz), SpO2 97.00%.  General Examination:   Genera;vacant facial expression, often called the "Parkinson's mask."  HEENT-  Normocephalic, no lesions, without obvious abnormality.  Normal external eye and conjunctiva.   NO UPWWARD GAZE palsy,   Significant hearing loss,  Normal auditory canals and external ears. Normal external nose, mucus membranes and septum.  Normal pharynx. Neck supple with no masses, nodes, nodules or enlargement. Lungs; CTA Heart; IRR, no murmurs, rubs and or gallops Abdomen: bowel sounds present Extremities - no edema, significant venous stasis induced hyperpigmentation, peritibial wasting Neurologic Examination:  Mental status Significant decline, in recent memory, cognition, attention, insight,  Could recall only 4 /20 in 1 one minute  Cranial Nerves  Pupils reactive EOMI, but has poor  Saccades  No facial droop Facial sensory intact Tongue midline, Tremor noticed in the tongue  Able to shrug shoulders Unable to whistle dysphagia  Motor: UE: able to lift antigravity,  Cogwheel rigidity with resting tremors that get worse with emotional stress. Slow, limited movement (bradykinesia), patient having hard time getting out of bed. Reflexes: 0 in all the 4 extremities Plantar response: mute LE: Able to lift both the legs antigravity but significant rigidity noticed without any sensory loss. Tone is increased throughout Posture: Stooped while made to sit   Sensory: intact to pin, temp and vibration, 2 point discrimination was affected  Gait: Patient was extremely  weak, could not be made to walk. However, daughter describes at home he has turning around and he takes baby steps and does not shuffle his hands Additionally, has hard time getting out of chair.     Laboratory Studies: Basic Metabolic Panel:  Lab 03/19/12 4098 03/18/12 1038 03/18/12 1030  NA 138 140 --  K 3.7 3.6 --  CL 102 101 --  CO2 27 31 --  GLUCOSE 99 109* --  BUN 22 22 --  CREATININE 1.04 1.16 --  CALCIUM 8.8 9.0 --  MG -- -- 2.2  PHOS -- -- --    Liver Function Tests:  Lab Mar 25, 2012 1038  AST 15  ALT 8  ALKPHOS 86  BILITOT 0.8  PROT 6.7  ALBUMIN 3.2*   No results found for this basename: LIPASE:5,AMYLASE:5 in the last 168 hours No results found for this basename: AMMONIA:3 in the last 168 hours  CBC:  Lab 03/19/12 0430 03/25/2012 1038  WBC 8.6 7.5  NEUTROABS -- 4.9  HGB 11.6* 12.5*  HCT 34.8* 36.9*  MCV 99.1 99.5  PLT 146* 149*    Cardiac Enzymes:  Lab 03/25/12 1038 Mar 25, 2012 1030  CKTOTAL 29 --  CKMB -- --  CKMBINDEX -- --  TROPONINI -- <0.30    BNP: No components found with this basename: POCBNP:5  CBG: No results found for this basename: GLUCAP:5 in the last 168 hours  Microbiology: Results for orders placed during the hospital encounter of 01/21/12  URINE CULTURE     Status: Normal   Collection Time   01/21/12  8:30 PM      Component Value Range Status Comment   Specimen Description URINE, CLEAN CATCH   Final    Special Requests NONE   Final    Culture  Setup Time [id number]   Final    Colony Count NO GROWTH   Final    Culture NO GROWTH   Final    Report Status 01/22/2012 FINAL   Final     Coagulation Studies:  Basename 03/19/12 0430 2012/03/25 1038  LABPROT 25.1* 29.0*  INR 2.23* 2.69*    Urinalysis:  Lab 2012/03/25 1052  COLORURINE YELLOW  LABSPEC 1.020  PHURINE 6.0  GLUCOSEU NEGATIVE  HGBUR NEGATIVE  BILIRUBINUR NEGATIVE  KETONESUR NEGATIVE  PROTEINUR NEGATIVE  UROBILINOGEN 1.0  NITRITE NEGATIVE  LEUKOCYTESUR NEGATIVE     Lipid Panel: No results found for this basename: chol, trig, hdl, cholhdl, vldl, ldlcalc    HgbA1C: Lab Results  Component Value Date   HGBA1C  Value: 7.6 (NOTE)   The ADA recommends the following therapeutic goals for glycemic   control related to Hgb A1C measurement:   Goal of Therapy:   < 7.0% Hgb A1C   Action Suggested:  > 8.0% Hgb A1C   Ref:  Diabetes Care, 22, Suppl. 1, 1999* 08/26/2007    Urine Drug Screen:   No results found for this basename: labopia, cocainscrnur, labbenz, amphetmu, thcu, labbarb     Alcohol Level: No results found for this basename: ETH:2 in the last 168 hours  Other results: EKG: normal EKG, normal sinus rhythm, unchanged from previous tracings.  Imaging: X-ray Chest Pa And Lateral   2012-03-25  *RADIOLOGY REPORT*  Clinical Data: Shortness of breath, fever, hypertension.  CHEST - 2 VIEW  Comparison: 2012/03/25  Findings: Cardiomegaly.  No edema, focal opacity or effusion. Degenerative changes in the thoracic spine and shoulders.  IMPRESSION: Cardiomegaly.  No active disease.  Original Report Authenticated By: Cyndie Chime, M.D.   Ct Head Wo Contrast  03/25/2012  *RADIOLOGY REPORT*  Clinical Data: Weakness.  CT HEAD WITHOUT CONTRAST  Technique:  Contiguous axial images were obtained from the base of the skull through the vertex without contrast.  Comparison: 11/26/2011.  Findings: No intracranial hemorrhage.  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  No intracranial mass lesion detected  on this unenhanced exam.  Global atrophy.  Ventricular prominence unchanged felt to be related to atrophy rather than hydrocephalus.  Vascular calcifications.  Complete opacification right maxillary sinus.  Mucosal thickening/partial opacification ethmoid sinus air cells.  IMPRESSION: No intracranial hemorrhage.  Remote left frontal lobe infarct and basal  ganglia infarcts with prominent small vessel disease type changes but without CT evidence of large acute infarct.  Global atrophy.  Complete opacification right maxillary sinus.  Mucosal thickening/partial opacification ethmoid sinus air cells.  Original Report Authenticated By: Fuller Canada, M.D.   Mr Thoracic Spine Wo Contrast  03/18/2012  *RADIOLOGY REPORT*  Clinical Data:  78 year old male with weakness.  Recent bladder infection.  Comparison: CT abdomen and pelvis 08/27/2007.  MRI THORACIC SPINE WITHOUT CONTRAST  Technique: Multiplanar and multiecho pulse sequences of the thoracic spine were obtained without intravenous contrast.  Findings:  Limited sagittal imaging of the cervical spine is remarkable for chronic lower cervical disc degeneration.  No definite significant cervical spinal stenosis.  Exaggerated thoracic kyphosis, but otherwise normal vertebral height alignment.  Normal thoracic marrow signal.  Heterogeneous increased T2 and STIR signal in lower thoracic disc (T9-T10 through T11-T12), but no reactive marrow changes, paraspinal or epidural inflammation.  Occasional benign vertebral body hemangiomas (T10).  No thoracic spinal stenosis.  Occasional small thoracic disc protrusions, and intermittent facet degeneration congruent with age. Spinal cord signal is within normal limits at all visualized levels.  Conus medullaris better demonstrated on the lumbar section below.  Negative visualized thoracic viscera.  Upper abdominal viscera remarkable for multiple large renal cysts, more so on the left.  IMPRESSION: 1.  No acute or significant findings in the thoracic spine.  Age congruent degenerative changes. 2.  Lumbar findings are below.  MRI LUMBAR SPINE WITHOUT CONTRAST  Technique: Multiplanar and multiecho pulse sequences of the lumbar spine were obtained without intravenous contrast.  Findings:  Transitional lumbosacral anatomy when utilizing the thoracic numbering system which is correlated to  the cervical spine.  The S1 level with lumbarized. Correlation with radiographs is recommended prior to any operative intervention.  Mild grade 1 anterolisthesis of L5 on S1 (4 mm) associated with disc and facet degeneration detailed below.  Otherwise normal lumbar vertebral height and alignment. No marrow edema or evidence of acute osseous abnormality.  Negative visualized sacrum.  Multiple large to a very large bilateral renal cystic lesions, visualized portions are stable from the 2009 comparison.  No retroperitoneal lymphadenopathy.  Posterior paraspinal soft tissues are within normal limits.   Visualized lower thoracic spinal cord is normal with conus medularis at L1-L2.  T12-L1:  Negative.  L1-L2:  Negative.  L2-L3:  Minimal disc bulge.  Moderate facet hypertrophy.  No significant stenosis.  L3-L4:  Mild disc bulge.  Moderate facet hypertrophy.  No significant stenosis.  L4-L5:  Moderate spinal stenosis related to circumferential mild disc bulge and moderate to severe facet and ligament flavum hypertrophy.  Mild bilateral lateral recess and left greater than right L4 foraminal stenosis.  L5-S1:  Grade 1 spondylolisthesis.  Broad-based disc/pseudo disc bulge.  Severe facet degeneration and hypertrophy.  Moderate ligament flavum hypertrophy.  Mild to moderate spinal stenosis results.  Mild left greater than right lateral recess stenosis. Mild to moderate L5 foraminal stenosis.  S1-S2:  Full size disc space.  Negative disc.  Moderate facet hypertrophy.  No significant stenosis.  IMPRESSION: 1.  Transitional lumbosacral anatomy, lumbarized S1 level and full size S1-S2 disc. Correlation with radiographs is recommended prior to any operative  intervention. 2.  Moderate multifactorial spinal stenosis at L4-L5 with mild lateral recess and left greater than right L4 foraminal stenosis. 3.  Mild to moderate multifactorial spinal stenosis at L5-S1 in the setting of spondylolisthesis and advanced facet degeneration.  Mild  lateral recess and mild to moderate bilateral L5 foraminal stenosis. 4.  Chronic multicystic renal disease, grossly stable since 2009.  Original Report Authenticated By: Harley Hallmark, M.D.   Mr Lumbar Spine Wo Contrast  03/18/2012  *RADIOLOGY REPORT*  Clinical Data:  78 year old male with weakness.  Recent bladder infection.  Comparison: CT abdomen and pelvis 08/27/2007.  MRI THORACIC SPINE WITHOUT CONTRAST  Technique: Multiplanar and multiecho pulse sequences of the thoracic spine were obtained without intravenous contrast.  Findings:  Limited sagittal imaging of the cervical spine is remarkable for chronic lower cervical disc degeneration.  No definite significant cervical spinal stenosis.  Exaggerated thoracic kyphosis, but otherwise normal vertebral height alignment.  Normal thoracic marrow signal.  Heterogeneous increased T2 and STIR signal in lower thoracic disc (T9-T10 through T11-T12), but no reactive marrow changes, paraspinal or epidural inflammation.  Occasional benign vertebral body hemangiomas (T10).  No thoracic spinal stenosis.  Occasional small thoracic disc protrusions, and intermittent facet degeneration congruent with age. Spinal cord signal is within normal limits at all visualized levels.  Conus medullaris better demonstrated on the lumbar section below.  Negative visualized thoracic viscera.  Upper abdominal viscera remarkable for multiple large renal cysts, more so on the left.  IMPRESSION: 1.  No acute or significant findings in the thoracic spine.  Age congruent degenerative changes. 2.  Lumbar findings are below.  MRI LUMBAR SPINE WITHOUT CONTRAST  Technique: Multiplanar and multiecho pulse sequences of the lumbar spine were obtained without intravenous contrast.  Findings:  Transitional lumbosacral anatomy when utilizing the thoracic numbering system which is correlated to the cervical spine.  The S1 level with lumbarized. Correlation with radiographs is recommended prior to any  operative intervention.  Mild grade 1 anterolisthesis of L5 on S1 (4 mm) associated with disc and facet degeneration detailed below.  Otherwise normal lumbar vertebral height and alignment. No marrow edema or evidence of acute osseous abnormality.  Negative visualized sacrum.  Multiple large to a very large bilateral renal cystic lesions, visualized portions are stable from the 2009 comparison.  No retroperitoneal lymphadenopathy.  Posterior paraspinal soft tissues are within normal limits.   Visualized lower thoracic spinal cord is normal with conus medularis at L1-L2.  T12-L1:  Negative.  L1-L2:  Negative.  L2-L3:  Minimal disc bulge.  Moderate facet hypertrophy.  No significant stenosis.  L3-L4:  Mild disc bulge.  Moderate facet hypertrophy.  No significant stenosis.  L4-L5:  Moderate spinal stenosis related to circumferential mild disc bulge and moderate to severe facet and ligament flavum hypertrophy.  Mild bilateral lateral recess and left greater than right L4 foraminal stenosis.  L5-S1:  Grade 1 spondylolisthesis.  Broad-based disc/pseudo disc bulge.  Severe facet degeneration and hypertrophy.  Moderate ligament flavum hypertrophy.  Mild to moderate spinal stenosis results.  Mild left greater than right lateral recess stenosis. Mild to moderate L5 foraminal stenosis.  S1-S2:  Full size disc space.  Negative disc.  Moderate facet hypertrophy.  No significant stenosis.  IMPRESSION: 1.  Transitional lumbosacral anatomy, lumbarized S1 level and full size S1-S2 disc. Correlation with radiographs is recommended prior to any operative intervention. 2.  Moderate multifactorial spinal stenosis at L4-L5 with mild lateral recess and left greater than right L4 foraminal stenosis. 3.  Mild to moderate multifactorial spinal stenosis at L5-S1 in the setting of spondylolisthesis and advanced facet degeneration.  Mild lateral recess and mild to moderate bilateral L5 foraminal stenosis. 4.  Chronic multicystic renal disease,  grossly stable since 2009.  Original Report Authenticated By: Harley Hallmark, M.D.   Dg Chest Portable 1 View  03/18/2012  *RADIOLOGY REPORT*  Clinical Data: Weakness.  Fever.  PORTABLE CHEST - 1 VIEW  Comparison: 01/21/2012  Findings: 1039 hours on The cardiopericardial silhouette is enlarged. Interstitial markings are diffusely coarsened with chronic features. There is pulmonary vascular congestion without overt pulmonary edema.  No focal airspace consolidation or overt airspace pulmonary edema. Imaged bony structures of the thorax are intact. Telemetry leads overlie the chest.  IMPRESSION: Cardiomegaly with vascular congestion superimposed on chronic underlying interstitial changes.  Original Report Authenticated By: ERIC A. MANSELL, M.D.    Assessment/Plan   Patient with progressive dementia, resting tremor initially was on right hand but now has progressed b/l, shuffling gait, REM Sleep disorder. Dysphagia, some hallucinations, with no EOMI deficits, fine motor skills abnormalities.  Also has some features of orthostatic.  NPH can mimic parkinson's features and apraxic gait however, patient apraxia with shuffling in addition, Head CT does not show indicative of  Ventricular abnormality Less likely to be  Frontotemporal dementia  Differential diagnosis 1) Parkinson's Disease 2) Multiple system Atrophy 3) Lewy Body dementia 4) NPH ( less likely   Recommendations: 1) Sinemet  Bid - will start low dose and then if patient has hallucinations  I can start him on Seroquel 12.5 mg at bed time. But at this time I will hold on to Seroquel because of sedative effects and combination of Sinemet and Seroquel may make it worse 2) Fall and Aspiration precautions 3) Will need Oupatient PET scan 4) Will need OP Neurology for further work up and see if he is a candidate for DBS   Thank you for the consult  Mervil Wacker V-P Eilleen Kempf., MD., Ph.D.,MS 03/19/2012 11:05 AM 03/19/2012, 11:05 AM

## 2012-03-19 NOTE — H&P (Signed)
Internal Medicine Teaching Service Attending Note Date: 03/19/2012  Patient name: [name] Graham  Medical record number: [id number]  Date of birth: 1928/11/01   I have seen and evaluated Madelynn Done and discussed their care with the Residency Team. Please see dr [facility] H&P for full details. Mr Martelle has had a gradual decline in his strength and an increase in his weakness of the B LE over the past 6 months. He was able to walk I into Dr Rosalyn Charters office in early July. He has limited his ambulation to and from the bathroom but on the day prior to admission, he was weaker and went back to PCP. That evening the weakness persisted and on the AM of admission was unable to sit up even with assistance. MRI of spine has R/O cord compression. He has had two episodes of chocking, one with pills and the other with liquid on the day prior to admit. Also episodes of intermittent L sided numbness and tingling that resolve spontaneously. The first was dx as a TIA and ASA was started but episodes have persisted.   About one week ago, he was seen by his PCP and dx and treated for a UTI with Cipro. The only sxs he had were urinary freq, incomplete voiding, and dribbling.Those sxs persisted.   He has had parkinson's for about 3 years and is on no treatment. He has a tremor of hands B L>R and worse with stress. Dr [name] had set the pt up to be seen by outpt neuro but there are no records in [facility].   He has severe aortic stenosis and is followed by Dr [name]. He has been deemed a poor surgical candidate but balloon valvuloplasty was mentioned.  He is on warfarin for A Fib which he has had for thirty years. He has also had B PE 2009 but his INR was 1.0. B/c this was his second PE (by report) and was submassive, a IVC filter was placed and has remained in place since then.   Pt was also started on Invega during the 2009 admission for hallucinations that his wife was going to die & agitation. Aricept was considered 2/2  dementia.  PMHx, meds, alelrgies, soc hx, and fam hx were reviewed  Filed Vitals:   03/19/12 0012 03/19/12 0221 03/19/12 0546 03/19/12 0742  BP: 126/61 112/69 125/68   Pulse: 99 93 88   Temp: 98.2 F (36.8 C)  97.5 F (36.4 C)   TempSrc: Oral     Resp: 18  16   Weight:    272 lb 14.9 oz (123.8 kg)  SpO2: 95%  97%    GEN : NAD, pleasant, answers questions appropriately HEENT : R eye ptosis, otherwise face syymetric No carotid bruits HRRR, loud harsh systolic murmur LCTAB ABD : + BS, S,NT, ND, no bladder distension could be felt Ext : trace edema B Neuro : resting tremor, pill rolling tremor L > R, UE grip 4/5 B, UE elbow extension and flexion 5/5 B, shoulder extension 4/5. LE strength knee flexion and extension 4+/5, plantar extension 3+/5 B, foot flexion 4/5.  Labs and imaging reviewed   Assessment and Plan: I agree with the formulated Assessment and Plan with the following changes:   1. LE weakness - greatly appreciate neuro's assistance who has started the pt on Sinemet. PT / OT will be seeing pt for assessment.   2. Urinary frequency - will check post-void residual. May be candidate for BPH tx as long but would  have to assess for orthostatic hypotension if an agent is used.  3. Aortic stenosis -   4. A Fib / h/o PE with IVC filter - cont warfarin per pharmacy  5. V tach - 7 beat run last PM. Resume pt's metoprolol but at lower dose 2/2 hypotension.   6. HTN - hold meds except BB. Follow BP.  Dr Meredith Pel will resume service on Monday  Burns Spain, MD 8/17/20131:42 PM

## 2012-03-19 NOTE — Progress Notes (Signed)
Medical Student Daily Progress Note  Subjective: Patient had a run of Vtach x 8 cycles overnight, received 12.5 mg Metoprolol from the overnight team. No other issues. He says he slept well, his daughter was here to speak with me and said he was experiencing some symptoms of AMS yesterday (didn't know he wasn't wearing shoes, was sensing temperature incorrectly, some agitation, etc.) but that has improved today. She says his weakness "dropped off" a few days ago after the family learned that his wife would need another operation for mediastinitis. The daughter feels there is a significant emotional component associated with his symptoms. He is feeling much stronger today as well. Objective: Vital signs in last 24 hours: Filed Vitals:   03/19/12 0012 03/19/12 0221 03/19/12 0546 03/19/12 0742  BP: 126/61 112/69 125/68   Pulse: 99 93 88   Temp: 98.2 F (36.8 C)  97.5 F (36.4 C)   TempSrc: Oral     Resp: 18  16   Weight:    123.8 kg (272 lb 14.9 oz)  SpO2: 95%  97%    Weight change:   Intake/Output Summary (Last 24 hours) at 03/19/12 1103 Last data filed at 03/18/12 2035  Gross per 24 hour  Intake      0 ml  Output      1 ml  Net     -1 ml   Physical Exam: Vitals reviewed. General: resting in bed, NAD HEENT: PERRL, EOMI, no scleral icterus Cardiac: irregularly irregular rate and rhythm, harsh 3/6 systolic ejection murmer radiating to the carotids. no rubs or gallops Pulm: clear to auscultation bilaterally, no wheezes, rales, or rhonchi Abd: soft, nontender, nondistended, BS present Ext: warm and well perfused, no pedal edema Skin: Left gluteal abscess with erythema ~4 cm in diameter and ulceration ~2 cm in diameter. Fluctuant with purulent discharge at ulceration. Neuro: alert and oriented X3.3/3 on recall and 3/3 on short-term memory. ptosis of right eyelid but otherwise cranial nerves II-XII grossly intact, strength 5/5 in upper extremities, 4/5 in knees and hips (improvement from  03/18/12), and 5/5 in the ankles. sensation to light touch equal in bilateral upper and lower extremities, DTRs remain 1+ throughout. Babinski negative. Patient does have a resting tremor in his arms R>L along with cogwheel rigidity of his extremities.   Lab Results: Basic Metabolic Panel:  Lab 03/19/12 1610 03/18/12 1038 03/18/12 1030  NA 138 140 --  K 3.7 3.6 --  CL 102 101 --  CO2 27 31 --  GLUCOSE 99 109* --  BUN 22 22 --  CREATININE 1.04 1.16 --  CALCIUM 8.8 9.0 --  MG -- -- 2.2  PHOS -- -- --   CBC:  Lab 03/19/12 0430 03/18/12 1038  WBC 8.6 7.5  NEUTROABS -- 4.9  HGB 11.6* 12.5*  HCT 34.8* 36.9*  MCV 99.1 99.5  PLT 146* 149*   BNP:  Lab 03/18/12 1615  PROBNP 2088.0*   Thyroid Function Tests:  Lab 03/18/12 1615  TSH 1.557  T4TOTAL --  FREET4 --  T3FREE --  THYROIDAB --   Coagulation:  Lab 03/19/12 0430 03/18/12 1038  LABPROT 25.1* 29.0*  INR 2.23* 2.69*   Anemia Panel:  Lab 03/18/12 1615  VITAMINB12 728  FOLATE --  FERRITIN --  TIBC --  IRON --  RETICCTPCT --   Urinalysis:  Lab 03/18/12 1052  COLORURINE YELLOW  LABSPEC 1.020  PHURINE 6.0  GLUCOSEU NEGATIVE  HGBUR NEGATIVE  BILIRUBINUR NEGATIVE  KETONESUR NEGATIVE  PROTEINUR NEGATIVE  UROBILINOGEN 1.0  NITRITE NEGATIVE  LEUKOCYTESUR NEGATIVE   Studies/Results: X-ray Chest Pa And Lateral   03/18/2012  *RADIOLOGY REPORT*  Clinical Data: Shortness of breath, fever, hypertension.  CHEST - 2 VIEW  Comparison: 03/18/2012  Findings: Cardiomegaly.  No edema, focal opacity or effusion. Degenerative changes in the thoracic spine and shoulders.  IMPRESSION: Cardiomegaly.  No active disease.  Original Report Authenticated By: Cyndie Chime, M.D.   Ct Head Wo Contrast  03/18/2012  *RADIOLOGY REPORT*  Clinical Data: Weakness.  CT HEAD WITHOUT CONTRAST  Technique:  Contiguous axial images were obtained from the base of the skull through the vertex without contrast.  Comparison: 11/26/2011.  Findings:  No intracranial hemorrhage.  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  No intracranial mass lesion detected on this unenhanced exam.  Global atrophy.  Ventricular prominence unchanged felt to be related to atrophy rather than hydrocephalus.  Vascular calcifications.  Complete opacification right maxillary sinus.  Mucosal thickening/partial opacification ethmoid sinus air cells.  IMPRESSION: No intracranial hemorrhage.  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  Global atrophy.  Complete opacification right maxillary sinus.  Mucosal thickening/partial opacification ethmoid sinus air cells.  Original Report Authenticated By: Fuller Canada, M.D.   Mr Thoracic Spine Wo Contrast  03/18/2012  *RADIOLOGY REPORT*  Clinical Data:  78 year old male with weakness.  Recent bladder infection.  Comparison: CT abdomen and pelvis 08/27/2007.  MRI THORACIC SPINE WITHOUT CONTRAST  Technique: Multiplanar and multiecho pulse sequences of the thoracic spine were obtained without intravenous contrast.  Findings:  Limited sagittal imaging of the cervical spine is remarkable for chronic lower cervical disc degeneration.  No definite significant cervical spinal stenosis.  Exaggerated thoracic kyphosis, but otherwise normal vertebral height alignment.  Normal thoracic marrow signal.  Heterogeneous increased T2 and STIR signal in lower thoracic disc (T9-T10 through T11-T12), but no reactive marrow changes, paraspinal or epidural inflammation.  Occasional benign vertebral body hemangiomas (T10).  No thoracic spinal stenosis.  Occasional small thoracic disc protrusions, and intermittent facet degeneration congruent with age. Spinal cord signal is within normal limits at all visualized levels.  Conus medullaris  better demonstrated on the lumbar section below.  Negative visualized thoracic viscera.  Upper abdominal viscera remarkable for multiple large renal cysts, more so on the left.  IMPRESSION: 1.  No acute or significant findings in the thoracic spine.  Age congruent degenerative changes. 2.  Lumbar findings are below.  MRI LUMBAR SPINE WITHOUT CONTRAST  Technique: Multiplanar and multiecho pulse sequences of the lumbar spine were obtained without intravenous contrast.  Findings:  Transitional lumbosacral anatomy when utilizing the thoracic numbering system which is correlated to the cervical spine.  The S1 level with lumbarized. Correlation with radiographs is recommended prior to any operative intervention.  Mild grade 1 anterolisthesis of L5 on S1 (4 mm) associated with disc and facet degeneration detailed below.  Otherwise normal lumbar vertebral height and alignment. No marrow edema or evidence of acute osseous abnormality.  Negative visualized sacrum.  Multiple large to a very large bilateral renal cystic lesions, visualized portions are stable from the 2009 comparison.  No retroperitoneal lymphadenopathy.  Posterior paraspinal soft tissues are within normal limits.   Visualized lower thoracic spinal cord is normal with conus medularis at L1-L2.  T12-L1:  Negative.  L1-L2:  Negative.  L2-L3:  Minimal disc bulge.  Moderate facet hypertrophy.  No significant stenosis.  L3-L4:  Mild disc bulge.  Moderate facet hypertrophy.  No significant stenosis.  L4-L5:  Moderate spinal stenosis related to circumferential mild disc bulge and moderate to severe facet and ligament flavum hypertrophy.  Mild bilateral lateral recess and left greater than right L4 foraminal stenosis.  L5-S1:  Grade 1 spondylolisthesis.  Broad-based disc/pseudo disc bulge.  Severe facet degeneration and hypertrophy.  Moderate ligament flavum hypertrophy.  Mild to moderate spinal stenosis results.  Mild left greater than right lateral recess stenosis.  Mild to moderate L5 foraminal stenosis.  S1-S2:  Full size disc space.  Negative disc.  Moderate facet hypertrophy.  No significant stenosis.  IMPRESSION: 1.  Transitional lumbosacral anatomy, lumbarized S1 level and full size S1-S2 disc. Correlation with radiographs is recommended prior to any operative intervention. 2.  Moderate multifactorial spinal stenosis at L4-L5 with mild lateral recess and left greater than right L4 foraminal stenosis. 3.  Mild to moderate multifactorial spinal stenosis at L5-S1 in the setting of spondylolisthesis and advanced facet degeneration.  Mild lateral recess and mild to moderate bilateral L5 foraminal stenosis. 4.  Chronic multicystic renal disease, grossly stable since 2009.  Original Report Authenticated By: Harley Hallmark, M.D.   Mr Lumbar Spine Wo Contrast  03/18/2012  *RADIOLOGY REPORT*  Clinical Data:  78 year old male with weakness.  Recent bladder infection.  Comparison: CT abdomen and pelvis 08/27/2007.  MRI THORACIC SPINE WITHOUT CONTRAST  Technique: Multiplanar and multiecho pulse sequences of the thoracic spine were obtained without intravenous contrast.  Findings:  Limited sagittal imaging of the cervical spine is remarkable for chronic lower cervical disc degeneration.  No definite significant cervical spinal stenosis.  Exaggerated thoracic kyphosis, but otherwise normal vertebral height alignment.  Normal thoracic marrow signal.  Heterogeneous increased T2 and STIR signal in lower thoracic disc (T9-T10 through T11-T12), but no reactive marrow changes, paraspinal or epidural inflammation.  Occasional benign vertebral body hemangiomas (T10).  No thoracic spinal stenosis.  Occasional small thoracic disc protrusions, and intermittent facet degeneration congruent with age. Spinal cord signal is within normal limits at all visualized levels.  Conus medullaris better demonstrated on the lumbar section below.  Negative visualized thoracic viscera.  Upper abdominal viscera  remarkable for multiple large renal cysts, more so on the left.  IMPRESSION: 1.  No acute or significant findings in the thoracic spine.  Age congruent degenerative changes. 2.  Lumbar findings are below.  MRI LUMBAR SPINE WITHOUT CONTRAST  Technique: Multiplanar and multiecho pulse sequences of the lumbar spine were obtained without intravenous contrast.  Findings:  Transitional lumbosacral anatomy when utilizing the thoracic numbering system which is correlated to the cervical spine.  The S1 level with lumbarized. Correlation with radiographs is recommended prior to any operative intervention.  Mild grade 1 anterolisthesis of L5 on S1 (4 mm) associated with disc and facet degeneration detailed below.  Otherwise normal lumbar vertebral height and alignment. No marrow edema or evidence of acute osseous abnormality.  Negative visualized sacrum.  Multiple large to a very large bilateral renal cystic lesions, visualized portions are stable from the 2009 comparison.  No retroperitoneal lymphadenopathy.  Posterior paraspinal soft tissues are within normal limits.   Visualized lower thoracic spinal cord is normal with conus medularis at L1-L2.  T12-L1:  Negative.  L1-L2:  Negative.  L2-L3:  Minimal disc bulge.  Moderate facet hypertrophy.  No significant stenosis.  L3-L4:  Mild disc bulge.  Moderate facet hypertrophy.  No significant stenosis.  L4-L5:  Moderate spinal stenosis related to circumferential mild disc bulge and moderate to severe facet and ligament flavum hypertrophy.  Mild bilateral lateral recess and left greater than right L4 foraminal stenosis.  L5-S1:  Grade 1 spondylolisthesis.  Broad-based disc/pseudo disc bulge.  Severe facet degeneration and hypertrophy.  Moderate ligament flavum hypertrophy.  Mild to moderate spinal stenosis results.  Mild left greater than right lateral recess stenosis. Mild to moderate L5 foraminal stenosis.  S1-S2:  Full size disc space.  Negative disc.  Moderate facet  hypertrophy.  No significant stenosis.  IMPRESSION: 1.  Transitional lumbosacral anatomy, lumbarized S1 level and full size S1-S2 disc. Correlation with radiographs is recommended prior to any operative intervention. 2.  Moderate multifactorial spinal stenosis at L4-L5 with mild lateral recess and left greater than right L4 foraminal stenosis. 3.  Mild to moderate multifactorial spinal stenosis at L5-S1 in the setting of spondylolisthesis and advanced facet degeneration.  Mild lateral recess and mild to moderate bilateral L5 foraminal stenosis. 4.  Chronic multicystic renal disease, grossly stable since 2009.  Original Report Authenticated By: Harley Hallmark, M.D.   Dg Chest Portable 1 View  03/18/2012  *RADIOLOGY REPORT*  Clinical Data: Weakness.  Fever.  PORTABLE CHEST - 1 VIEW  Comparison: 01/21/2012  Findings: 1039 hours on The cardiopericardial silhouette is enlarged. Interstitial markings are diffusely coarsened with chronic features. There is pulmonary vascular congestion without overt pulmonary edema.  No focal airspace consolidation or overt airspace pulmonary edema. Imaged bony structures of the thorax are intact. Telemetry leads overlie the chest.  IMPRESSION: Cardiomegaly with vascular congestion superimposed on chronic underlying interstitial changes.  Original Report Authenticated By: ERIC A. MANSELL, M.D.   Medications: I have reviewed the patient's current medications. Scheduled Meds:   . allopurinol  300 mg Oral Daily  . aspirin  81 mg Oral Daily  . carbidopa-levodopa  1 tablet Oral BID  . levothyroxine  175 mcg Oral Daily  . metoprolol succinate  12.5 mg Oral Daily  . metoprolol tartrate  12.5 mg Oral Once  . omega-3 acid ethyl esters  1 g Oral BID  . pantoprazole  40 mg Oral Q1200  . polyethylene glycol  17 g Oral Daily  . simvastatin  10 mg Oral q1800  . sucralfate  1 g Oral BID  . warfarin  2.5 mg Oral Custom  . warfarin  5 mg Oral Q M,W,F-1800  . Warfarin - Physician Dosing  Inpatient   Does not apply q1800  . DISCONTD: Fish Oil  1,000 mg Oral BID  . DISCONTD: lidocaine  5 mL Infiltration Once  . DISCONTD: paliperidone  3 mg Oral Daily   Continuous Infusions:  PRN Meds:.LORazepam, DISCONTD: ondansetron (ZOFRAN) IV, DISCONTD: ondansetron Assessment/Plan: Principal Problem:  *Weakness of both legs: The clinical story has become more clear now that his primary caretaker is here and his cognition has improved. It seems that his progressive weakness really started getting worse in the past week, concomitantly with his UTI. In addition, the "drop-off" in his strength and mental status came after learning that his wife was getting another surgery. Deconditioning 2/2 staying with his wife in the hospital is likely playing a role in his weakness along with worsening Parkinsonian symptoms. A conversion disorder cannot be ruled out based on the history, but is not likely the sole contributor to this process. Regardless of etiology, his clinical picture is significantly improved today. He is more alert and oriented  to go along with increased strength in his lower extremities. - Appreciate neurology recommendations - PT/OT recs pending, the patient would likely benefit from inpatient rehabilitation. Considering his wife is on the same floor here at [facility], I strongly recommend this transfer.  Active Problems: Parkinson's Disease: According to his daughter, the patient had an episode of agitation and violence, went to a health care facility and was placed on an anti-psychotic agent for presumed agitation 2/2 schizophrenia. This has almost certainly worsened his parkinson's. - Appreciate neuro recs regarding medications - Provided daughter with reading material for patients regarding caring for patients with Parkinson's  Urinary Incontinence: The patient says this was an issue when he was first diagnosed with a UTI one week ago and that cipro helped to alleviate his incontinence.  However, with his recent decompensation over the past 2 days hsi incontinence has returned. He says when he is taking lasix he can go up to 20 times a day and never feel it coming. He does not feel like his bladder is empty when he is done. No dysuria or hematuria. Ddx for this condition includes overflow incontinence 2/2 BPH. - Order post-void residual to assess for bladder distention and I&O cath if this is present - Pt may need urology consult if the symptoms persist.  Gluteal abscess: No fevers overnight, the patient says the area still feels tender but the pain has improved since yesterday. Lesion is fluctuant. - Warm compresses Q4H with concurrent dressing changes  AORTIC STENOSIS/ INSUFFICIENCY: Per Dr. Rosalyn Charters records, Mr. Beasley is a poor candidate for TAVR. His metoprolol dose was decreased due to hypotension.  - Start metoprolol 50 mg PO QD. His bp has been in the 120s/70s. Will be protective for future risk of ventricular tachycardia.   Atrial fibrillation: Pt has been in atrial fibrillation during this hospitalization. INR is therapeutic. - Continue warfarin regimen   HYPERTENSION, BENIGN: bps on admission were low (100s/50s), but since have improved. -Restart metoprolol at 50 mg PO QDay. Monitor bps and continue other bp meds tomorrow if his pressures remain stable.  FEN/GI: No complaints today, patient is eating well. No BM yet. - Continue current bowel regimen  Dispo: Inpatient - May be able to d/c patient to Inpt rehab either tomorrow or Monday   LOS: 1 day   This is a Psychologist, occupational Note.  The care of the patient was discussed with Dr. Tonny Branch and the assessment and plan formulated with their assistance.  Please see their attached note for official documentation of the daily encounter.  Luretha Rued 03/19/2012, 11:03 AM

## 2012-03-19 NOTE — Progress Notes (Signed)
Resident Co-sign Daily Note: I have seen the patient and reviewed the daily progress note by Kathrin Ruddy MS IV and discussed the care of the patient with them.  See below for documentation of my findings, assessment, and plans.  Subjective: Slept okay, 8 beat run of Vtach.  States his weakness built over the last few months but the last two days he has been unable to walk.  Lots of family in the room today.    Objective: Vital signs in last 24 hours: Filed Vitals:   03/19/12 0221 03/19/12 0546 03/19/12 0742 03/19/12 1514  BP: 112/69 125/68  134/81  Pulse: 93 88  95  Temp:  97.5 F (36.4 C)  98.4 F (36.9 C)  TempSrc:    Axillary  Resp:  16  18  Weight:   272 lb 14.9 oz (123.8 kg)   SpO2:  97%  100%   Physical Exam: Vitals reviewed. General: resting in bed, NAD, masked facies. HEENT: PERRL, EOMI, no scleral icterus Cardiac: Irregularly irregular rate and rhythm.  4/6 systolic murmur best heart in the base of the heart to the carotids.  no rubs, murmurs or gallops Pulm: clear to auscultation bilaterally, no wheezes, rales, or rhonchi Abd: soft, nontender, nondistended, BS present Ext: Left gluteal abscess with erythema.  4x2 cm.  Flucutant with purulent discharge.   Neuro: alert and oriented X3, cranial nerves II-XII grossly intact mild ptosis of the right lid but full movement.  strength is 5/5 in hip, upper extremities, and ankles.  4/5 in the bilateral knees.  Symmetric.  sensation to light touch equal in bilateral upper and lower extremities.  Resting pill rolling tremor noted bilaterally.  Question of action tremor with handshake as well.   Lab Results: Reviewed and documented in Electronic Record Micro Results: Reviewed and documented in Electronic Record Studies/Results: Reviewed and documented in Electronic Record Medications: I have reviewed the patient's current medications. Scheduled Meds:   . allopurinol  300 mg Oral Daily  . aspirin  81 mg Oral Daily  .  carbidopa-levodopa  1 tablet Oral BID  . levothyroxine  175 mcg Oral Daily  . metoprolol succinate  12.5 mg Oral Daily  . metoprolol tartrate  12.5 mg Oral Once  . omega-3 acid ethyl esters  1 g Oral BID  . pantoprazole  40 mg Oral Q1200  . polyethylene glycol  17 g Oral Daily  . simvastatin  10 mg Oral q1800  . sucralfate  1 g Oral BID  . warfarin  2.5 mg Oral Custom  . warfarin  5 mg Oral Q M,W,F-1800  . Warfarin - Physician Dosing Inpatient   Does not apply q1800  . DISCONTD: paliperidone  3 mg Oral Daily   Continuous Infusions:  PRN Meds:.LORazepam, DISCONTD: ondansetron (ZOFRAN) IV, DISCONTD: ondansetron  Assessment/Plan: 1. Weakness of both legs: ON admission he has bilateral weakness of his legs as well as some mild altered mental status.  His daughter is his primary caregiver and she is present today. He has had progressive weakness that has been getting slowly worse over the last few months but really started getting worse in the past week, concomitantly with his UTI. In addition, the "drop-off" in his strength and mental status came after learning that his wife was getting another surgery. Deconditioning 2/2 to decreased activity and staying with his wife in the hospital is likely playing a role in his weakness along with worsening Parkinsonian symptoms.   - Appreciate neurology recommendations   - PT/OT  recs include inpatient rehab.  Considering his wife  is on the same floor here at [facility]   2. Parkinson's Disease: According to his daughter, the patient had an episode of agitation and violence, went to a health care facility and was placed on an anti-psychotic agent for presumed agitation 2/2 schizophrenia. This has almost certainly worsened his parkinson's.   - Appreciate neuro recs regarding medications   - Holding Hinda Glatter  - Provided daughter with reading material for patients regarding caring for patients with Parkinson's   3. Urinary Incontinence: The patient says this was  an issue when he was first diagnosed with a UTI one week ago and that cipro helped to alleviate his incontinence. However, with his recent decompensation over the past 2 days his incontinence has returned. He says when he is taking lasix he can go up to 20 times a day and never feel it coming. He does not feel like his bladder is empty when he is done. No dysuria or hematuria. Ddx for this condition includes overflow incontinence 2/2 BPH.   - Order post-void residual to assess for bladder distention and I&O cath if this is present   - Pt may need urology consult if the symptoms persist.   4. Gluteal abscess: No fevers overnight, the patient says the area still feels tender but the pain has improved since yesterday. Lesion is fluctuant.   - Warm compresses Q4H with concurrent dressing changes   5. AORTIC STENOSIS/ INSUFFICIENCY: Per Dr. Rosalyn Charters records, Mr. Vastine is a poor candidate for TAVR. His metoprolol dose was decreased due to hypotension.   - Start metoprolol 50 mg PO QD. His bp has been in the 120s/70s. Will be protective for future risk of  ventricular tachycardia.   - Will restart his lasix at half his home dose.   6. Atrial fibrillation: Pt has been in atrial fibrillation during this hospitalization. INR is therapeutic.   - Continue warfarin regimen   7. HYPERTENSION, BENIGN: bps on admission were low (100s/50s), but since have improved.   -Restart metoprolol at 50 mg PO QDay. Monitor bps and continue other bp meds tomorrow if his  pressures remain stable.   8. FEN/GI: No complaints today, patient is eating well. No BM yet.   - Continue current bowel regimen   9. Dispo: Inpatient, Pending inpatient rehabilitation evaluation.    LOS: 1 day   Keyston Ardolino 03/19/2012, 4:17 PM

## 2012-03-20 LAB — PROTIME-INR: INR: 2.26 — ABNORMAL HIGH (ref 0.00–1.49)

## 2012-03-20 MED ORDER — PNEUMOCOCCAL VAC POLYVALENT 25 MCG/0.5ML IJ INJ
0.5000 mL | INJECTION | INTRAMUSCULAR | Status: AC
  Administered 2012-03-21: 0.5 mL via INTRAMUSCULAR
  Filled 2012-03-20: qty 0.5

## 2012-03-20 MED ORDER — SENNOSIDES-DOCUSATE SODIUM 8.6-50 MG PO TABS
2.0000 | ORAL_TABLET | Freq: Two times a day (BID) | ORAL | Status: DC
  Administered 2012-03-20 – 2012-03-29 (×18): 2 via ORAL
  Filled 2012-03-20 (×3): qty 2
  Filled 2012-03-20: qty 1
  Filled 2012-03-20 (×2): qty 2
  Filled 2012-03-20: qty 1
  Filled 2012-03-20 (×12): qty 2

## 2012-03-20 MED ORDER — CARBIDOPA-LEVODOPA CR 25-100 MG PO TBCR
2.0000 | EXTENDED_RELEASE_TABLET | Freq: Two times a day (BID) | ORAL | Status: DC
  Administered 2012-03-20 – 2012-03-21 (×2): 2 via ORAL
  Filled 2012-03-20 (×4): qty 2

## 2012-03-20 NOTE — Progress Notes (Signed)
Subjective: Patient seen at the bedside, resting tremor has  Improved, No hallucinations have been reported. Patient appears more calm and alert today. He denied hallucinations and the daughter agreed. He is c/o  Constipation.  He was able to eat his breakfast this morning but did not have dysphagia, According to the daughter his dysphagia significantly associated only with certain foods and  intermittently  Objective: Current vital signs: BP 112/60  Pulse 84  Temp 98.9 F (37.2 C) (Oral)  Resp 20  Wt 123.8 kg (272 lb 14.9 oz)  SpO2 98% Vital signs in last 24 hours: Temp:  [98.4 F (36.9 C)-98.9 F (37.2 C)] 98.9 F (37.2 C) (08/18 0538) Pulse Rate:  [84-95] 84  (08/18 0538) Resp:  [18-20] 20  (08/18 0538) BP: (112-134)/(58-81) 112/60 mmHg (08/18 0538) SpO2:  [98 %-100 %] 98 % (08/18 0538)  Intake/Output from previous day: 08/17 0701 - 08/18 0700 In: 720 [P.O.:720] Out: 1050 [Urine:1050] Intake/Output this shift:   Nutritional status: General  Neurologic Exam: AAO  Follows commands Memory and cognition still remains compromised.  Motor;  Resting tremor, gets worse with emotional or distration But has improved since yesterday Tone has improved specifically in the LE.    Lab Results: Results for orders placed during the hospital encounter of 03/18/12 (from the past 48 hour(s))  TSH     Status: Normal   Collection Time   03/18/12  4:15 PM      Component Value Range Comment   TSH 1.557  0.350 - 4.500 uIU/mL   PRO B NATRIURETIC PEPTIDE     Status: Abnormal   Collection Time   03/18/12  4:15 PM      Component Value Range Comment   Pro B Natriuretic peptide (BNP) 2088.0 (*) 0 - 450 pg/mL   VITAMIN B12     Status: Normal   Collection Time   03/18/12  4:15 PM      Component Value Range Comment   Vitamin B-12 728  211 - 911 pg/mL   BASIC METABOLIC PANEL     Status: Abnormal   Collection Time   03/19/12  4:30 AM      Component Value Range Comment   Sodium 138  135 -  145 mEq/L    Potassium 3.7  3.5 - 5.1 mEq/L    Chloride 102  96 - 112 mEq/L    CO2 27  19 - 32 mEq/L    Glucose, Bld 99  70 - 99 mg/dL    BUN 22  6 - 23 mg/dL    Creatinine, Ser 4.09  0.50 - 1.35 mg/dL    Calcium 8.8  8.4 - 81.1 mg/dL    GFR calc non Af Amer 66 (*) >90 mL/min    GFR calc Af Amer 76 (*) >90 mL/min   CBC     Status: Abnormal   Collection Time   03/19/12  4:30 AM      Component Value Range Comment   WBC 8.6  4.0 - 10.5 K/uL    RBC 3.51 (*) 4.22 - 5.81 MIL/uL    Hemoglobin 11.6 (*) 13.0 - 17.0 g/dL    HCT 91.4 (*) 78.2 - 52.0 %    MCV 99.1  78.0 - 100.0 fL    MCH 33.0  26.0 - 34.0 pg    MCHC 33.3  30.0 - 36.0 g/dL    RDW 95.6  21.3 - 08.6 %    Platelets 146 (*) 150 - 400 K/uL  PROTIME-INR     Status: Abnormal   Collection Time   03/19/12  4:30 AM      Component Value Range Comment   Prothrombin Time 25.1 (*) 11.6 - 15.2 seconds    INR 2.23 (*) 0.00 - 1.49   PROTIME-INR     Status: Abnormal   Collection Time   03/20/12  6:45 AM      Component Value Range Comment   Prothrombin Time 25.3 (*) 11.6 - 15.2 seconds    INR 2.26 (*) 0.00 - 1.49     No results found for this or any previous visit (from the past 240 hour(s)).  Lipid Panel No results found for this basename: CHOL,TRIG,HDL,CHOLHDL,VLDL,LDLCALC in the last 72 hours  Studies/Results: X-ray Chest Pa And Lateral   03/18/2012  *RADIOLOGY REPORT*  Clinical Data: Shortness of breath, fever, hypertension.  CHEST - 2 VIEW  Comparison: 03/18/2012  Findings: Cardiomegaly.  No edema, focal opacity or effusion. Degenerative changes in the thoracic spine and shoulders.  IMPRESSION: Cardiomegaly.  No active disease.  Original Report Authenticated By: Cyndie Chime, M.D.   Ct Head Wo Contrast  03/18/2012  *RADIOLOGY REPORT*  Clinical Data: Weakness.  CT HEAD WITHOUT CONTRAST  Technique:  Contiguous axial images were obtained from the base of the skull through the vertex without contrast.  Comparison: 11/26/2011.   Findings: No intracranial hemorrhage.  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  No intracranial mass lesion detected on this unenhanced exam.  Global atrophy.  Ventricular prominence unchanged felt to be related to atrophy rather than hydrocephalus.  Vascular calcifications.  Complete opacification right maxillary sinus.  Mucosal thickening/partial opacification ethmoid sinus air cells.  IMPRESSION: No intracranial hemorrhage.  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  Global atrophy.  Complete opacification right maxillary sinus.  Mucosal thickening/partial opacification ethmoid sinus air cells.  Original Report Authenticated By: Fuller Canada, M.D.   Mr Thoracic Spine Wo Contrast  03/18/2012  *RADIOLOGY REPORT*  Clinical Data:  78 year old male with weakness.  Recent bladder infection.  Comparison: CT abdomen and pelvis 08/27/2007.  MRI THORACIC SPINE WITHOUT CONTRAST  Technique: Multiplanar and multiecho pulse sequences of the thoracic spine were obtained without intravenous contrast.  Findings:  Limited sagittal imaging of the cervical spine is remarkable for chronic lower cervical disc degeneration.  No definite significant cervical spinal stenosis.  Exaggerated thoracic kyphosis, but otherwise normal vertebral height alignment.  Normal thoracic marrow signal.  Heterogeneous increased T2 and STIR signal in lower thoracic disc (T9-T10 through T11-T12), but no reactive marrow changes, paraspinal or epidural inflammation.  Occasional benign vertebral body hemangiomas (T10).  No thoracic spinal stenosis.  Occasional small thoracic disc protrusions, and intermittent facet degeneration congruent with age. Spinal cord signal is within normal limits at all visualized levels.  Conus  medullaris better demonstrated on the lumbar section below.  Negative visualized thoracic viscera.  Upper abdominal viscera remarkable for multiple large renal cysts, more so on the left.  IMPRESSION: 1.  No acute or significant findings in the thoracic spine.  Age congruent degenerative changes. 2.  Lumbar findings are below.  MRI LUMBAR SPINE WITHOUT CONTRAST  Technique: Multiplanar and multiecho pulse sequences of the lumbar spine were obtained without intravenous contrast.  Findings:  Transitional lumbosacral anatomy when utilizing the thoracic numbering  system which is correlated to the cervical spine.  The S1 level with lumbarized. Correlation with radiographs is recommended prior to any operative intervention.  Mild grade 1 anterolisthesis of L5 on S1 (4 mm) associated with disc and facet degeneration detailed below.  Otherwise normal lumbar vertebral height and alignment. No marrow edema or evidence of acute osseous abnormality.  Negative visualized sacrum.  Multiple large to a very large bilateral renal cystic lesions, visualized portions are stable from the 2009 comparison.  No retroperitoneal lymphadenopathy.  Posterior paraspinal soft tissues are within normal limits.   Visualized lower thoracic spinal cord is normal with conus medularis at L1-L2.  T12-L1:  Negative.  L1-L2:  Negative.  L2-L3:  Minimal disc bulge.  Moderate facet hypertrophy.  No significant stenosis.  L3-L4:  Mild disc bulge.  Moderate facet hypertrophy.  No significant stenosis.  L4-L5:  Moderate spinal stenosis related to circumferential mild disc bulge and moderate to severe facet and ligament flavum hypertrophy.  Mild bilateral lateral recess and left greater than right L4 foraminal stenosis.  L5-S1:  Grade 1 spondylolisthesis.  Broad-based disc/pseudo disc bulge.  Severe facet degeneration and hypertrophy.  Moderate ligament flavum hypertrophy.  Mild to moderate spinal stenosis results.  Mild left greater than right lateral recess  stenosis. Mild to moderate L5 foraminal stenosis.  S1-S2:  Full size disc space.  Negative disc.  Moderate facet hypertrophy.  No significant stenosis.  IMPRESSION: 1.  Transitional lumbosacral anatomy, lumbarized S1 level and full size S1-S2 disc. Correlation with radiographs is recommended prior to any operative intervention. 2.  Moderate multifactorial spinal stenosis at L4-L5 with mild lateral recess and left greater than right L4 foraminal stenosis. 3.  Mild to moderate multifactorial spinal stenosis at L5-S1 in the setting of spondylolisthesis and advanced facet degeneration.  Mild lateral recess and mild to moderate bilateral L5 foraminal stenosis. 4.  Chronic multicystic renal disease, grossly stable since 2009.  Original Report Authenticated By: Harley Hallmark, M.D.   Mr Lumbar Spine Wo Contrast  03/18/2012  *RADIOLOGY REPORT*  Clinical Data:  78 year old male with weakness.  Recent bladder infection.  Comparison: CT abdomen and pelvis 08/27/2007.  MRI THORACIC SPINE WITHOUT CONTRAST  Technique: Multiplanar and multiecho pulse sequences of the thoracic spine were obtained without intravenous contrast.  Findings:  Limited sagittal imaging of the cervical spine is remarkable for chronic lower cervical disc degeneration.  No definite significant cervical spinal stenosis.  Exaggerated thoracic kyphosis, but otherwise normal vertebral height alignment.  Normal thoracic marrow signal.  Heterogeneous increased T2 and STIR signal in lower thoracic disc (T9-T10 through T11-T12), but no reactive marrow changes, paraspinal or epidural inflammation.  Occasional benign vertebral body hemangiomas (T10).  No thoracic spinal stenosis.  Occasional small thoracic disc protrusions, and intermittent facet degeneration congruent with age. Spinal cord signal is within normal limits at all visualized levels.  Conus medullaris better demonstrated on the lumbar section below.  Negative visualized thoracic viscera.  Upper  abdominal viscera remarkable for multiple large renal cysts, more so on the left.  IMPRESSION: 1.  No acute or significant findings in the thoracic spine.  Age congruent degenerative changes. 2.  Lumbar findings are below.  MRI LUMBAR SPINE WITHOUT CONTRAST  Technique: Multiplanar and multiecho pulse sequences of the lumbar spine were obtained without intravenous contrast.  Findings:  Transitional lumbosacral anatomy when utilizing the thoracic numbering system which is correlated to the cervical spine.  The S1 level with lumbarized. Correlation with radiographs is recommended prior to any  operative intervention.  Mild grade 1 anterolisthesis of L5 on S1 (4 mm) associated with disc and facet degeneration detailed below.  Otherwise normal lumbar vertebral height and alignment. No marrow edema or evidence of acute osseous abnormality.  Negative visualized sacrum.  Multiple large to a very large bilateral renal cystic lesions, visualized portions are stable from the 2009 comparison.  No retroperitoneal lymphadenopathy.  Posterior paraspinal soft tissues are within normal limits.   Visualized lower thoracic spinal cord is normal with conus medularis at L1-L2.  T12-L1:  Negative.  L1-L2:  Negative.  L2-L3:  Minimal disc bulge.  Moderate facet hypertrophy.  No significant stenosis.  L3-L4:  Mild disc bulge.  Moderate facet hypertrophy.  No significant stenosis.  L4-L5:  Moderate spinal stenosis related to circumferential mild disc bulge and moderate to severe facet and ligament flavum hypertrophy.  Mild bilateral lateral recess and left greater than right L4 foraminal stenosis.  L5-S1:  Grade 1 spondylolisthesis.  Broad-based disc/pseudo disc bulge.  Severe facet degeneration and hypertrophy.  Moderate ligament flavum hypertrophy.  Mild to moderate spinal stenosis results.  Mild left greater than right lateral recess stenosis. Mild to moderate L5 foraminal stenosis.  S1-S2:  Full size disc space.  Negative disc.  Moderate  facet hypertrophy.  No significant stenosis.  IMPRESSION: 1.  Transitional lumbosacral anatomy, lumbarized S1 level and full size S1-S2 disc. Correlation with radiographs is recommended prior to any operative intervention. 2.  Moderate multifactorial spinal stenosis at L4-L5 with mild lateral recess and left greater than right L4 foraminal stenosis. 3.  Mild to moderate multifactorial spinal stenosis at L5-S1 in the setting of spondylolisthesis and advanced facet degeneration.  Mild lateral recess and mild to moderate bilateral L5 foraminal stenosis. 4.  Chronic multicystic renal disease, grossly stable since 2009.  Original Report Authenticated By: Harley Hallmark, M.D.    Medications:  Scheduled:   . allopurinol  300 mg Oral Daily  . aspirin  81 mg Oral Daily  . carbidopa-levodopa  2 tablet Oral BID  . furosemide  40 mg Oral Daily  . levothyroxine  175 mcg Oral Daily  . metoprolol succinate  12.5 mg Oral Daily  . omega-3 acid ethyl esters  1 g Oral BID  . pantoprazole  40 mg Oral Q1200  . polyethylene glycol  17 g Oral Daily  . senna-docusate  2 tablet Oral BID  . simvastatin  10 mg Oral q1800  . sucralfate  1 g Oral BID  . warfarin  2.5 mg Oral Custom  . Warfarin - Physician Dosing Inpatient   Does not apply q1800  . DISCONTD: carbidopa-levodopa  1 tablet Oral BID    Assessment and Recommendations: 78 y/o with PD  Has improved on Sinemet.  No hallucinations or other side effects have been recorded. Plan:  1) Increase Sinemet two pills bid 2) Senakot for constipation 3) PT/OT - discussed with them about on and off  4) Will follow Belinda Bringhurst V-P Eilleen Kempf., MD., Ph.D.,MS 03/20/2012 11:45 AM

## 2012-03-20 NOTE — Progress Notes (Signed)
Pharmacist Heart Failure Core Measure Documentation  Assessment: [name] Graham has an EF documented as 55-60% on 09/08/11 by ECHO.  Rationale: Heart failure patients with left ventricular systolic dysfunction (LVSD) and an EF < 40% should be prescribed an angiotensin converting enzyme inhibitor (ACEI) or angiotensin receptor blocker (ARB) at discharge unless a contraindication is documented in the medical record.  This patient is not currently on an ACEI or ARB for HF.  This note is being placed in the record in order to provide documentation that a contraindication to the use of these agents is present for this encounter.  ACE Inhibitor or Angiotensin Receptor Blocker is contraindicated (specify all that apply)  []   ACEI allergy AND ARB allergy []   Angioedema [x]   Moderate or severe aortic stenosis *Note patient was on ACEI (captopril) prior to admission* []   Hyperkalemia []   Hypotension []   Renal artery stenosis []   Worsening renal function, preexisting renal disease or dysfunction   Link Snuffer, PharmD, BCPS Clinical Pharmacist [phone]  03/20/2012 10:25 AM

## 2012-03-20 NOTE — Progress Notes (Signed)
Subjective: Still weak today.  Has not been out of bed since working with PT.  Complaining of constipation.  Denies hallucinations, or side effects from the Sinemet.  Son at bedside states he is better today with less tremor and less emotional lability.  Ate well with no choking spells.    Objective: Vital signs in last 24 hours: Filed Vitals:   03/19/12 0742 03/19/12 1514 03/19/12 2048 03/20/12 0538  BP:  134/81 118/58 112/60  Pulse:  95 95 84  Temp:  98.4 F (36.9 C) 98.6 F (37 C) 98.9 F (37.2 C)  TempSrc:  Axillary Oral Oral  Resp:  18 18 20   Weight: 272 lb 14.9 oz (123.8 kg)     SpO2:  100% 98% 98%   Weight change:   Intake/Output Summary (Last 24 hours) at 03/20/12 1319 Last data filed at 03/20/12 0541  Gross per 24 hour  Intake    480 ml  Output    850 ml  Net   -370 ml   Vitals reviewed. General: resting in bed, NAD HEENT: PERRL, EOMI, no scleral icterus Cardiac: Irregularly irregular rate and rhythm, 4/6 systolic murmur with radiation to the carotids. no rubs, murmurs or gallops Pulm: clear to auscultation bilaterally, no wheezes, rales, or rhonchi Abd: soft, nontender, nondistended, BS present Ext: warm and well perfused, no pedal edema, left gluteal abscess with mild erythema.   Neuro: alert and oriented X3, cranial nerves II-XII grossly intact, resting pill rolling tremor noted but improved from yesterday.  Tone with some mild rigidity still but improved.  sensation to light touch equal in bilateral upper and lower extremities.  BLE strength 4+/5.  Difficulty maintaining sitting.   Lab Results: Basic Metabolic Panel:  Lab 03/19/12 1478 03/18/12 1038 03/18/12 1030  NA 138 140 --  K 3.7 3.6 --  CL 102 101 --  CO2 27 31 --  GLUCOSE 99 109* --  BUN 22 22 --  CREATININE 1.04 1.16 --  CALCIUM 8.8 9.0 --  MG -- -- 2.2  PHOS -- -- --   Liver Function Tests:  Lab 03/18/12 1038  AST 15  ALT 8  ALKPHOS 86  BILITOT 0.8  PROT 6.7  ALBUMIN 3.2*    CBC:  Lab 03/19/12 0430 03/18/12 1038  WBC 8.6 7.5  NEUTROABS -- 4.9  HGB 11.6* 12.5*  HCT 34.8* 36.9*  MCV 99.1 99.5  PLT 146* 149*   Cardiac Enzymes:  Lab 03/18/12 1038 03/18/12 1030  CKTOTAL 29 --  CKMB -- --  CKMBINDEX -- --  TROPONINI -- <0.30   BNP:  Lab 03/18/12 1615  PROBNP 2088.0*   Thyroid Function Tests:  Lab 03/18/12 1615  TSH 1.557  T4TOTAL --  FREET4 --  T3FREE --  THYROIDAB --   Coagulation:  Lab 03/20/12 0645 03/19/12 0430 03/18/12 1038  LABPROT 25.3* 25.1* 29.0*  INR 2.26* 2.23* 2.69*   Anemia Panel:  Lab 03/18/12 1615  VITAMINB12 728  FOLATE --  FERRITIN --  TIBC --  IRON --  RETICCTPCT --   Urinalysis:  Lab 03/18/12 1052  COLORURINE YELLOW  LABSPEC 1.020  PHURINE 6.0  GLUCOSEU NEGATIVE  HGBUR NEGATIVE  BILIRUBINUR NEGATIVE  KETONESUR NEGATIVE  PROTEINUR NEGATIVE  UROBILINOGEN 1.0  NITRITE NEGATIVE  LEUKOCYTESUR NEGATIVE   Micro Results: No results found for this or any previous visit (from the past 240 hour(s)). Studies/Results: X-ray Chest Pa And Lateral   03/18/2012  *RADIOLOGY REPORT*  Clinical Data: Shortness of breath, fever, hypertension.  CHEST - 2 VIEW  Comparison: 03/18/2012  Findings: Cardiomegaly.  No edema, focal opacity or effusion. Degenerative changes in the thoracic spine and shoulders.  IMPRESSION: Cardiomegaly.  No active disease.  Original Report Authenticated By: Cyndie Chime, M.D.   Medications: I have reviewed the patient's current medications. Scheduled Meds:   . allopurinol  300 mg Oral Daily  . aspirin  81 mg Oral Daily  . carbidopa-levodopa  2 tablet Oral BID  . furosemide  40 mg Oral Daily  . levothyroxine  175 mcg Oral Daily  . metoprolol succinate  12.5 mg Oral Daily  . omega-3 acid ethyl esters  1 g Oral BID  . pantoprazole  40 mg Oral Q1200  . polyethylene glycol  17 g Oral Daily  . senna-docusate  2 tablet Oral BID  . simvastatin  10 mg Oral q1800  . sucralfate  1 g Oral BID  .  warfarin  2.5 mg Oral Custom  . Warfarin - Physician Dosing Inpatient   Does not apply q1800  . DISCONTD: carbidopa-levodopa  1 tablet Oral BID   Continuous Infusions:  PRN Meds:.LORazepam  Assessment/Plan: 1. Weakness of both legs: ON admission he has bilateral weakness of his legs as well as some mild altered mental status. His daughter is his primary caregiver and she is present today. He has had progressive weakness that has been getting slowly worse over the last few months but really started getting worse in the past week, concomitantly with his UTI. In addition, the "drop-off" in his strength and mental status came after learning that his wife was getting another surgery. Deconditioning 2/2 to decreased activity and staying with his wife in the hospital is likely playing a role in his weakness along with worsening Parkinsonian symptoms.  Weakness mildly improved today with definite decrease in rigidity today.   - Appreciate neurology recommendations   - PT/OT recs include inpatient rehab and pending rehab consult  2. Parkinson's Disease: According to his daughter, the patient had an episode of agitation and violence, went to a health care facility and was placed on an anti-psychotic agent for presumed agitation 2/2 schizophrenia. This has almost certainly worsened his parkinson's.  Neuro saw him yesterday and started Sinemet at low dose.  He has tolerated this well and they are going to increase it to BID today.    - Appreciate neuro recs regarding medications   - Holding Hinda Glatter   - Provided daughter with reading material for patients regarding caring for patients with   Parkinson's   3. Urinary Incontinence: The patient says this was an issue when he was first diagnosed with a UTI one week ago and that cipro helped to alleviate his incontinence. However, with his recent decompensation over the past 2 days his incontinence has returned. He says when he is taking lasix he can go up to 20 times  a day and never feel it coming. He does not feel like his bladder is empty when he is done. No dysuria or hematuria. Ddx for this condition includes overflow incontinence 2/2 BPH.  PVR not done so we will have them do it today.  Condom cath in place.    - Order post-void residual to assess for bladder distention and I&O cath if this is present   - Pt may need urology consult if the symptoms persist.   4. Gluteal abscess: No fevers overnight, the patient says the area still feels tender but the pain has improved since yesterday. Lesion is fluctuant  and less erythematous today.   - Warm compresses Q4H with concurrent dressing changes   5. AORTIC STENOSIS/ INSUFFICIENCY: Per Dr. Rosalyn Charters records, Mr. Foree is a poor candidate for TAVR. His metoprolol dose was decreased due to hypotension.   - Start metoprolol 50 mg PO QD. His bp has been in the 120s/70s. Will be protective for   future risk of ventricular tachycardia.   - Will restart his lasix at half his home dose.   6. Atrial fibrillation: Pt has been in atrial fibrillation during this hospitalization. INR is therapeutic.   - Continue warfarin regimen   7. HYPERTENSION, BENIGN: bps on admission were low (100s/50s), but since have improved.   -Restart metoprolol at 50 mg PO QDay. Monitor bps and continue other bp meds tomorrow  if his pressures remain stable.   8. FEN/GI: No complaints today, patient is eating well. No BM yet.   - Continue current bowel regimen   9. Dispo: Inpatient, Pending inpatient rehabilitation evaluation.    LOS: 2 days   Kayona Foor 03/20/2012, 1:19 PM

## 2012-03-21 DIAGNOSIS — G20A1 Parkinson's disease without dyskinesia, without mention of fluctuations: Secondary | ICD-10-CM

## 2012-03-21 DIAGNOSIS — G20 Parkinson's disease: Secondary | ICD-10-CM

## 2012-03-21 DIAGNOSIS — R32 Unspecified urinary incontinence: Secondary | ICD-10-CM

## 2012-03-21 DIAGNOSIS — I10 Essential (primary) hypertension: Secondary | ICD-10-CM

## 2012-03-21 DIAGNOSIS — R269 Unspecified abnormalities of gait and mobility: Secondary | ICD-10-CM

## 2012-03-21 LAB — CBC
Platelets: 181 10*3/uL (ref 150–400)
RDW: 14.2 % (ref 11.5–15.5)
WBC: 7.3 10*3/uL (ref 4.0–10.5)

## 2012-03-21 LAB — PROTIME-INR: INR: 2.22 — ABNORMAL HIGH (ref 0.00–1.49)

## 2012-03-21 MED ORDER — CARBIDOPA-LEVODOPA ER 50-200 MG PO TBCR
1.0000 | EXTENDED_RELEASE_TABLET | Freq: Three times a day (TID) | ORAL | Status: DC
  Administered 2012-03-21 – 2012-03-29 (×25): 1 via ORAL
  Filled 2012-03-21 (×27): qty 1

## 2012-03-21 MED ORDER — WARFARIN SODIUM 5 MG PO TABS
5.0000 mg | ORAL_TABLET | ORAL | Status: DC
  Administered 2012-03-21 – 2012-03-23 (×2): 5 mg via ORAL
  Filled 2012-03-21 (×3): qty 1

## 2012-03-21 MED ORDER — WARFARIN SODIUM 2.5 MG PO TABS
2.5000 mg | ORAL_TABLET | ORAL | Status: DC
  Administered 2012-03-22 – 2012-03-24 (×2): 2.5 mg via ORAL
  Filled 2012-03-21 (×2): qty 1

## 2012-03-21 MED ORDER — MAGNESIUM HYDROXIDE 400 MG/5ML PO SUSP: 15.0000 mL | Freq: Every day | ORAL | Status: DC | PRN

## 2012-03-21 MED ORDER — GLYCERIN (LAXATIVE) 2.1 G RE SUPP
1.0000 | RECTAL | Status: DC | PRN
  Administered 2012-03-23 – 2012-03-25 (×2): 1 via RECTAL
  Filled 2012-03-21 (×2): qty 1

## 2012-03-21 NOTE — Progress Notes (Signed)
Physical Therapy Treatment Patient Details Name: [name] Graham MRN: [id number] DOB: June 22, 1929 Today's Date: 03/21/2012 Time: 1130-1200 PT Time Calculation (min): 30 min  PT Assessment / Plan / Recommendation Comments on Treatment Session  Pt able to ambulate this session with +2 for safety.  Pt will continue to benefit from inpatient rehab.        Follow Up Recommendations  Inpatient Rehab;Supervision/Assistance - 24 hour    Barriers to Discharge        Equipment Recommendations  Defer to next venue    Recommendations for Other Services    Frequency Min 4X/week   Plan Discharge plan remains appropriate;Frequency remains appropriate    Precautions / Restrictions Precautions Precautions: Fall   Pertinent Vitals/Pain No c/o pain    Mobility  Bed Mobility Bed Mobility: Rolling Right;Right Sidelying to Sit Rolling Right: 3: Mod assist Right Sidelying to Sit: 3: Mod assist Sitting - Scoot to Edge of Bed: 2: Max assist Details for Bed Mobility Assistance: (A) to initiate rolling and (A) to elevate trunk OOB with max cues to advance hips EOB with facilitation of proper weight shifts to advance hips Transfers Transfers: Sit to Stand;Stand to Sit Sit to Stand: 1: +2 Total assist;From bed;With upper extremity assist Sit to Stand: Patient Percentage: 60% Stand to Sit: 1: +2 Total assist;To bed;With upper extremity assist Stand to Sit: Patient Percentage: 60% Details for Transfer Assistance: VCs for hand placement and forward translation to complete sit <>stand Ambulation/Gait Ambulation/Gait Assistance: 1: +2 Total assist Ambulation/Gait: Patient Percentage: 70% Ambulation Distance (Feet): 150 Feet Assistive device: Rolling walker Ambulation/Gait Assistance Details: =+2 (A) for safety with max cues for step sequence and VCs and clapping for rhythmn to promote proper gait sequence.  Recliner to follow for safety Gait Pattern: Step-to pattern;Decreased stride length;Shuffle;Trunk  flexed;Decreased trunk rotation    Exercises     PT Diagnosis:    PT Problem List:   PT Treatment Interventions:     PT Goals Acute Rehab PT Goals PT Goal Formulation: With patient Time For Goal Achievement: 04/02/12 Potential to Achieve Goals: Good Pt will go Supine/Side to Sit: with min assist PT Goal: Supine/Side to Sit - Progress: Progressing toward goal Pt will go Sit to Supine/Side: with min assist PT Goal: Sit to Supine/Side - Progress: Progressing toward goal Pt will go Sit to Stand: with mod assist PT Goal: Sit to Stand - Progress: Progressing toward goal Pt will go Stand to Sit: with mod assist PT Goal: Stand to Sit - Progress: Progressing toward goal Pt will Transfer Bed to Chair/Chair to Bed: with mod assist PT Transfer Goal: Bed to Chair/Chair to Bed - Progress: Progressing toward goal Pt will Ambulate: >150 feet;with supervision;with rolling walker PT Goal: Ambulate - Progress: Goal set today  Visit Information  Last PT Received On: 03/21/12 Assistance Needed: +2 PT/OT Co-Evaluation/Treatment: Yes    Subjective Data  Subjective: "I guess I can walk." Patient Stated Goal: to get better and be able to walk   Cognition  Overall Cognitive Status: Impaired Area of Impairment: Memory Arousal/Alertness: Awake/alert Orientation Level: Appears intact for tasks assessed Behavior During Session: [facility] for tasks performed    Balance     End of Session PT - End of Session Equipment Utilized During Treatment: Gait belt Activity Tolerance: Patient limited by fatigue Patient left: in chair;with call bell/phone within reach Nurse Communication: Mobility status   GP     Gearld Kerstein 03/21/2012, 1:10 PM Jake Shark, PT DPT [phone]

## 2012-03-21 NOTE — Progress Notes (Signed)
Internal Medicine Attending  Date: 03/21/2012  Patient name: [name] Graham Medical record number: [id number] Date of birth: [birth year] Age: 78 y.o. Gender: male  I saw and evaluated the patient on AM rounds and discussed his care with resident Dr. Tonny Branch. I reviewed the resident's note by Dr. Tonny Branch and I agree with the resident's findings and plans as documented in his note.

## 2012-03-21 NOTE — Progress Notes (Signed)
SS enema given with moderate amount of solid stool returned. Pt states his stomach no longer aches. Bev Caterina Racine [employer]

## 2012-03-21 NOTE — Progress Notes (Signed)
Subjective:  Patient with PD.  Has significant improvement on Sinemet.   No hallucinations, no delusions or confusions. He is very much awake, alert and follows commands. He still c/o constipation.  And had no BM. No complications overnight   Objective: Current vital signs: BP 114/70  Pulse 79  Temp 99 F (37.2 C) (Oral)  Resp 16  Wt 123.8 kg (272 lb 14.9 oz)  SpO2 93% Vital signs in last 24 hours: Temp:  [99 F (37.2 C)-99.6 F (37.6 C)] 99 F (37.2 C) (08/19 4098) Pulse Rate:  [79-96] 79  (08/19 0637) Resp:  [16-18] 16  (08/19 0637) BP: (114-151)/(70-79) 114/70 mmHg (08/19 0637) SpO2:  [93 %-96 %] 93 % (08/19 0637)  Intake/Output from previous day: 08/18 0701 - 08/19 0700 In: 480 [P.O.:480] Out: 1472 [Urine:1472] Intake/Output this shift: Total I/O In: 240 [P.O.:240] Out: -  Nutritional status: General  Neurologic Exam: Mental status:  AAO follows commands.  No hallucinations, No delusions  Speech: Improved  Cranial nerves: no deficits  Motor: Able to move all the extremities antigravity Significant resting tremors. But tremors has been reduced.  Rigidity present but has improved Reflexes 0 Sensory intact   Lab Results: Results for orders placed during the hospital encounter of 03/18/12 (from the past 48 hour(s))  PROTIME-INR     Status: Abnormal   Collection Time   03/20/12  6:45 AM      Component Value Range Comment   Prothrombin Time 25.3 (*) 11.6 - 15.2 seconds    INR 2.26 (*) 0.00 - 1.49   PROTIME-INR     Status: Abnormal   Collection Time   03/21/12  5:15 AM      Component Value Range Comment   Prothrombin Time 25.0 (*) 11.6 - 15.2 seconds    INR 2.22 (*) 0.00 - 1.49   CBC     Status: Abnormal   Collection Time   03/21/12  5:15 AM      Component Value Range Comment   WBC 7.3  4.0 - 10.5 K/uL    RBC 3.61 (*) 4.22 - 5.81 MIL/uL    Hemoglobin 12.0 (*) 13.0 - 17.0 g/dL    HCT 11.9 (*) 14.7 - 52.0 %    MCV 98.1  78.0 - 100.0 fL    MCH 33.2   26.0 - 34.0 pg    MCHC 33.9  30.0 - 36.0 g/dL    RDW 82.9  56.2 - 13.0 %    Platelets 181  150 - 400 K/uL     No results found for this or any previous visit (from the past 240 hour(s)).  Lipid Panel No results found for this basename: CHOL,TRIG,HDL,CHOLHDL,VLDL,LDLCALC in the last 72 hours  Studies/Results: No results found.  Medications:  Scheduled:   . allopurinol  300 mg Oral Daily  . aspirin  81 mg Oral Daily  . carbidopa-levodopa  2 tablet Oral BID  . furosemide  40 mg Oral Daily  . levothyroxine  175 mcg Oral Daily  . metoprolol succinate  12.5 mg Oral Daily  . omega-3 acid ethyl esters  1 g Oral BID  . pantoprazole  40 mg Oral Q1200  . pneumococcal 23 valent vaccine  0.5 mL Intramuscular Tomorrow-1000  . polyethylene glycol  17 g Oral Daily  . senna-docusate  2 tablet Oral BID  . simvastatin  10 mg Oral q1800  . sucralfate  1 g Oral BID  . warfarin  2.5 mg Oral Q T,Th,S,Su-1800  . warfarin  5 mg Oral Q M,W,F-1800  . Warfarin - Physician Dosing Inpatient   Does not apply q1800  . DISCONTD: carbidopa-levodopa  1 tablet Oral BID      Assessment and Recommendations  Patient with progressive PD. Started on Sinemet has improved significantly.  I am increasing the dose gradually Afraid if he starts getting hallucinations from the meds.  But so far no hallucinations. If hallucinations persists Can start on seroquel 12.5 mg at bed time. But seroquel has significant sedative effect. Will monitor his progress  Recommendations: 1) Sinemet  Tid  ( 8 AM 12 Pm and 4 PM ) strict guidelines for duration 2) Recommend Enema for Constipation 3) C/W PT/OT  Harlee Pursifull V-P Eilleen Kempf., MD., Ph.D.,MS 03/21/2012 9:49 AM

## 2012-03-21 NOTE — Progress Notes (Signed)
Resident Co-sign Daily Note: I have seen the patient and reviewed the daily progress note by Kathrin Ruddy MS IV and discussed the care of the patient with them.  See below for documentation of my findings, assessment, and plans.  Subjective: Feeling okay today.  Still no BM.  Appetite good and he is working with PT.  Gluteal abscess with decreased pain.    Objective: Vital signs in last 24 hours: Filed Vitals:   03/20/12 2013 03/21/12 0637 03/21/12 0900 03/21/12 1204  BP: 151/79 114/70  167/92  Pulse: 96 79  110  Temp: 99.6 F (37.6 C) 99 F (37.2 C)    TempSrc: Oral Oral    Resp: 18 16    Height:   6' 0.05" (1.83 m)   Weight:   272 lb 14.9 oz (123.8 kg)   SpO2: 96% 93%     Physical Exam: Vitals reviewed. General: resting in bed, NAD HEENT: PERRL, EOMI, no scleral icterus Cardiac: Irregularly irregular, 4/6 systolic murmur with radiation to the carotids. no rubs, or gallops Pulm: clear to auscultation bilaterally, no wheezes, rales, or rhonchi Abd: soft, nontender, mildly distended, BS present Ext: warm and well perfused, no pedal edema, mild erythema on left gluteal abscess.   Neuro: alert and oriented X3, cranial nerves II-XII grossly intact, pill rolling tremor noted.  Tone with mild rigidity.  sensation to light touch equal in bilateral upper and lower extremities.  BLE strength is 4+/5.   Lab Results: Reviewed and documented in Electronic Record Micro Results: Reviewed and documented in Electronic Record Studies/Results: Reviewed and documented in Electronic Record Medications: I have reviewed the patient's current medications. Scheduled Meds:   . allopurinol  300 mg Oral Daily  . aspirin  81 mg Oral Daily  . carbidopa-levodopa  1 tablet Oral TID  . furosemide  40 mg Oral Daily  . levothyroxine  175 mcg Oral Daily  . metoprolol succinate  12.5 mg Oral Daily  . omega-3 acid ethyl esters  1 g Oral BID  . pantoprazole  40 mg Oral Q1200  . pneumococcal 23 valent  vaccine  0.5 mL Intramuscular Tomorrow-1000  . polyethylene glycol  17 g Oral Daily  . senna-docusate  2 tablet Oral BID  . simvastatin  10 mg Oral q1800  . sucralfate  1 g Oral BID  . warfarin  2.5 mg Oral Q T,Th,S,Su-1800  . warfarin  5 mg Oral Q M,W,F-1800  . Warfarin - Physician Dosing Inpatient   Does not apply q1800  . DISCONTD: carbidopa-levodopa  2 tablet Oral BID   Continuous Infusions:  PRN Meds:.Glycerin (Adult), LORazepam, DISCONTD: magnesium hydroxide  Assessment/Plan: 1. Weakness of both legs: ON admission he has bilateral weakness of his legs as well as some mild altered mental status. His daughter is his primary caregiver and she is present today. He has had progressive weakness that has been getting slowly worse over the last few months but really started getting worse in the past week, concomitantly with his UTI. In addition, the "drop-off" in his strength and mental status came after learning that his wife was getting another surgery. Deconditioning 2/2 to decreased activity and staying with his wife in the hospital is likely playing a role in his weakness along with worsening Parkinsonian symptoms. Weakness mildly improved today with definite decrease in rigidity since starting his medications.  Plan to increase his Sinamet to TID today.  PT and OT continued to suggest Inpatient Rehab.  Rehab MD say him and is working to  clear it through his insurance.   - Appreciate neurology recommendations   - PT/OT recs include inpatient rehab   - Plan to D/C to rehab pending insurance approval.   2. Parkinson's Disease: According to his daughter, the patient had an episode of agitation and violence, went to a health care facility and was placed on an anti-psychotic agent for presumed agitation 2/2 schizophrenia. This has almost certainly worsened his parkinson's. Neuro saw him yesterday and started Sinemet at low dose. He has tolerated this well and they will continue to increase it today  to TID.    - Appreciate neuro recs regarding medications   - Holding Hinda Glatter   - Provided daughter with reading material for patients regarding caring for patients with   Parkinson's   3. Urinary Incontinence: The patient says this was an issue when he was first diagnosed with a UTI one week ago and that cipro helped to alleviate his incontinence. However, with his recent decompensation over the past 2 days his incontinence has returned. He says when he is taking lasix he can go up to 20 times a day and never feel it coming. He does not feel like his bladder is empty when he is done. No dysuria or hematuria. Ddx for this condition includes overflow incontinence 2/2 BPH. PVR was 22 mls so this is not a retention issue.  There is no convincing findings on the UA.  It is more likely secondary to the inability to get up to the bathroom fast enough. Condom cath in place to protect skin.   4. Gluteal abscess: No fevers overnight, the patient says the area still feels tender but the pain has improved since yesterday. Lesion is fluctuant and less erythematous today.   - Warm compresses Q4H with concurrent dressing changes   5. AORTIC STENOSIS/ INSUFFICIENCY: Per Dr. Rosalyn Charters records, Mr. Macauley is a poor candidate for TAVR. His metoprolol dose was decreased due to hypotension and has been controlling his rate as well as his blood pressure.  He may need increased dose as he gets to be more active again.   - Start metoprolol 50 mg PO QD. His bp has been in the 120s/70s. Will be protective for   future risk of ventricular tachycardia.   - Will restart his lasix at half his home dose.   6. Atrial fibrillation: Pt has been in atrial fibrillation during this hospitalization. INR is therapeutic.   - Continue warfarin regimen   7. HYPERTENSION, BENIGN: bps on admission were low (100s/50s), but since have improved and have been running well during his stay on his current regimen of just Lasix and Metoprolol.    -Restart metoprolol at 50 mg PO QDay.   8. FEN/GI: No BM yet. But is eating well.  Will give enema today and continue senna.  Will also give a glycerin suppository PRN as well.   - Continue current bowel regimen   9. Dispo: Inpatient rehab has seen and pending insurance approval.    LOS: 3 days   Parish Augustine 03/21/2012, 1:41 PM

## 2012-03-21 NOTE — Consult Note (Signed)
Physical Medicine and Rehabilitation Consult Reason for Consult:  BLE weakness Referring Physician:  Dr. Meredith Pel   HPI: [name] Graham is a 78 y.o. male with history of severe AS, dementia, PD-no treatment,  who has had decline in mobility for the past 6 months. Admitted on 03/18/12 with BLE weakness with limited mobility for a few weeks, progressive agitation, dysphagia,   left sided numbness and incontinence.  Patient with recent treatment for UTI. MRI T/L spine with multifactorial spine stenosis L4-S1.  CCT with global atrophy and remove left frontal lobe and BG infarcts.  Neurology consulted and felt that patient with progressive dementia and features consistent with orthostasis. Patient started on sinemet with improvement in tremors.  PT/OT evaluations done on 08/17 and patient noted to have difficulty bearing weight on BLE and initiating movement.  Therapy team and MD recommending CIR.   l  Review of Systems  HENT: Negative for hearing loss.   Eyes: Negative for blurred vision and double vision.  Respiratory: Negative for cough and shortness of breath.   Cardiovascular: Negative for chest pain and palpitations.  Genitourinary: Positive for urgency and frequency.  Musculoskeletal: Negative for back pain and joint pain.  Neurological: Positive for weakness. Negative for headaches.  All other systems reviewed and are negative.   Past Medical History  Diagnosis Date  . Pulmonary embolus     bilateral  . Hypothyroid   . Hypertension   . CAD (coronary artery disease)   . Chronic atrial fibrillation     INR therpeutic on Coumadin  . Dementia     hallucinations status post psychiatric evaluation during admission  . Kidney cyst, acquired   . A-fib   . CAD (coronary artery disease)     cath 2006, no stents, ejection fraction  . Leukocytosis   . Tremor   . Parkinson disease    Past Surgical History  Procedure Date  . Mole removakl    Family History  Problem Relation Age of Onset   . Heart failure Mother   . Microcephaly Father    Social History: Married. Daughter and son-in-law moved in to provide assistance.  Son also helps. Wife currently in ICU. Patient reports that he has quit smoking. His smoking use included Cigarettes. He has a 24 pack-year smoking history. He does not have any smokeless tobacco history on file. He reports that he does not drink alcohol or use illicit drugs.   Allergies  Allergen Reactions  . Morphine And Related     Goes Crazy  . Nifedipine Other (See Comments)    "makes me go crazy"  . Nitroglycerin    Medications Prior to Admission  Medication Sig Dispense Refill  . acetaminophen (ARTHRITIS PAIN RELIEF) 650 MG CR tablet Take 650 mg by mouth. Take 2 tabs AM and 1 tab PM       . allopurinol (ZYLOPRIM) 300 MG tablet Take 300 mg by mouth daily.        Marland Kitchen aspirin 81 MG chewable tablet Chew 81 mg by mouth daily.      . captopril (CAPOTEN) 25 MG tablet Take 25 mg by mouth 2 (two) times daily. As directed        . Ciprofloxacin (CIPRO PO) Take by mouth 2 times daily at 12 noon and 4 pm.       . Cyanocobalamin (VITAMIN B-12 IJ) Inject as directed every 30 (thirty) days.      . furosemide (LASIX) 80 MG tablet Take 80 mg by mouth daily.        Marland Kitchen  levothyroxine (SYNTHROID) 175 MCG tablet Take 175 mcg by mouth daily.        Marland Kitchen LORazepam (ATIVAN) 1 MG tablet Take 1 mg by mouth as needed. For anxiety      . metoprolol (TOPROL XL) 50 MG 24 hr tablet Take 50 mg by mouth daily.        . Misc Natural Products (OSTEO BI-FLEX ADV DOUBLE ST PO) Take by mouth. Take 2 tabs daily       . Omega-3 Fatty Acids (FISH OIL) 1000 MG CAPS Take by mouth. Take 1 capsule by mouth two times a day       . Omeprazole 20 MG TBEC Take by mouth. 1 tablet once a day       . pravastatin (PRAVACHOL) 10 MG tablet Take 10 mg by mouth daily. Take 2 tabs daily       . sucralfate (CARAFATE) 1 G tablet Take 1 g by mouth 2 (two) times daily.        Marland Kitchen warfarin (COUMADIN) 2.5 MG tablet  Take 2.5-5 mg by mouth daily. Takes 2.5mg  (1 tablet) daily except 5mg  (2 tablets) on Monday, Wednesday, and Friday      . paliperidone (INVEGA) 3 MG 24 hr tablet Take 3 mg by mouth daily.          Home: Home Living Lives With: Daughter;Other (Comment) (and son in law moved in to pt's home) Available Help at Discharge: Family;Available PRN/intermittently Type of Home: Mobile home Home Access: Ramped entrance Home Layout: One level Bathroom Shower/Tub: Tub/shower unit;Curtain Bathroom Toilet: Handicapped height Home Adaptive Equipment: Straight cane;Grab bars in shower;Hand-held shower hose;Tub transfer bench;Wheelchair - powered  Functional History: Prior Function Driving: Yes Vocation: Retired Functional Status:  Mobility: Bed Mobility Bed Mobility: Supine to Sit;Sitting - Scoot to [place] of Bed;Sit to Supine;Scooting to [facility] Supine to Sit: 2: Max assist;With rails;HOB flat Sitting - Scoot to Edge of Bed: 3: Mod assist Sit to Supine: 1: +2 Total assist Sit to Supine: Patient Percentage: 30% Scooting to HOB: 1: +2 Total assist Scooting to [facility]: Patient Percentage: 0% Transfers Transfers: Sit to Stand;Stand to Sit Sit to Stand: 1: +2 Total assist;From bed;With upper extremity assist Sit to Stand: Patient Percentage: 60% Stand to Sit: 1: +2 Total assist;To bed;With upper extremity assist Stand to Sit: Patient Percentage: 60% Ambulation/Gait Ambulation/Gait Assistance: Not tested (comment)    ADL: ADL Eating/Feeding: Independent;Simulated Where Assessed - Eating/Feeding: Bed level Grooming: Performed;Wash/dry face;Set up Where Assessed - Grooming: Unsupported sitting Upper Body Bathing: Performed;Maximal assistance Where Assessed - Upper Body Bathing: Unsupported sitting Lower Body Bathing: Performed;+1 Total assistance Where Assessed - Lower Body Bathing: Supported sit to stand Upper Body Dressing: Performed;Moderate assistance Where Assessed - Upper Body Dressing:  Unsupported sitting Lower Body Dressing: Performed;+1 Total assistance Where Assessed - Lower Body Dressing: Supported sit to stand Equipment Used: Gait belt Transfers/Ambulation Related to ADLs: Transfer deferred due to safety concerns.  Pt unable to ambulate. ADL Comments: Pt performs UB ADL very slowly.    Cognition: Cognition Arousal/Alertness: Awake/alert Orientation Level: Oriented X4 Cognition Overall Cognitive Status: Impaired Area of Impairment: Memory Arousal/Alertness: Awake/alert Orientation Level: Appears intact for tasks assessed Behavior During Session: WFL for tasks performed Memory:  (Pt inaccurately reported his home situation and DME.)  Blood pressure 114/70, pulse 79, temperature 99 F (37.2 C), temperature source Oral, resp. rate 16, weight 123.8 kg (272 lb 14.9 oz), SpO2 93.00%. Physical Exam  Nursing note and vitals reviewed. Constitutional: He is oriented to  person, place, and time. He appears well-developed and well-nourished.  HENT:  Head: Normocephalic and atraumatic.  Eyes: Pupils are equal, round, and reactive to light.  Neck: Normal range of motion. Neck supple.  Cardiovascular: Normal rate and regular rhythm.        Holosystolic murmur  Pulmonary/Chest: Effort normal and breath sounds normal. No respiratory distress.  Abdominal: Soft. Bowel sounds are normal. He exhibits no distension. There is no tenderness.  Musculoskeletal: He exhibits no edema.       Stasis changes R>LLE.  Neurological: He is alert and oriented to person, place, and time. No sensory deficit.       Follows basic commands without difficulty.  Masked facies. Significant intentional tremors RUE > LUE. Tongue tremors. Speech slightly muffled.  RUE with slight rigidity.  Decreased memory with poor insight. Is able to move all 4's.   Skin: Skin is warm and dry.    Results for orders placed during the hospital encounter of 03/18/12 (from the past 24 hour(s))  PROTIME-INR     Status:  Abnormal   Collection Time   03/21/12  5:15 AM      Component Value Range   Prothrombin Time 25.0 (*) 11.6 - 15.2 seconds   INR 2.22 (*) 0.00 - 1.49  CBC     Status: Abnormal   Collection Time   03/21/12  5:15 AM      Component Value Range   WBC 7.3  4.0 - 10.5 K/uL   RBC 3.61 (*) 4.22 - 5.81 MIL/uL   Hemoglobin 12.0 (*) 13.0 - 17.0 g/dL   HCT 47.8 (*) 29.5 - 62.1 %   MCV 98.1  78.0 - 100.0 fL   MCH 33.2  26.0 - 34.0 pg   MCHC 33.9  30.0 - 36.0 g/dL   RDW 30.8  65.7 - 84.6 %   Platelets 181  150 - 400 K/uL   No results found.  Assessment/Plan: Diagnosis: parkinson's disease with exacerbation/pseudoexacerbation due to urosepsis, orthostasis 1. Does the need for close, 24 hr/day medical supervision in concert with the patient's rehab needs make it unreasonable for this patient to be served in a less intensive setting? Yes 2. Co-Morbidities requiring supervision/potential complications: htn, AS, afib,  3. Due to bladder management, bowel management, safety, skin/wound care, disease management, medication administration, pain management and patient education, does the patient require 24 hr/day rehab nursing? Yes 4. Does the patient require coordinated care of a physician, rehab nurse, PT (1-2 hrs/day, 5 days/week), OT (1-2 hrs/day, 5 days/week) and SLP (1-2 hrs/day, 5 days/week) to address physical and functional deficits in the context of the above medical diagnosis(es)? Yes Addressing deficits in the following areas: balance, endurance, locomotion, strength, transferring, bowel/bladder control, bathing, dressing, feeding, grooming, toileting, cognition, speech, language, swallowing and psychosocial support 5. Can the patient actively participate in an intensive therapy program of at least 3 hrs of therapy per day at least 5 days per week? Yes and Potentially 6. The potential for patient to make measurable gains while on inpatient rehab is excellent and good 7. Anticipated functional  outcomes upon discharge from inpatient rehab are minimal assist to supervision with PT, minimal assist to supervision with OT, supervision to minimal assist with SLP. 8. Estimated rehab length of stay to reach the above functional goals is: 2 weeks 9. Does the patient have adequate social supports to accommodate these discharge functional goals? Yes 10. Anticipated D/C setting: Home 11. Anticipated post D/C treatments: HH therapy 12. Overall Rehab/Functional Prognosis:  excellent  RECOMMENDATIONS: This patient's condition is appropriate for continued rehabilitative care in the following setting: CIR Patient has agreed to participate in recommended program. Yes Note that insurance prior authorization may be required for reimbursement for recommended care.  Comment: Rehab RN to follow up.  Ivory Broad, MD  03/21/2012

## 2012-03-21 NOTE — Progress Notes (Signed)
Occupational Therapy Treatment Patient Details Name: [name] Graham MRN: [id number] DOB: 11/21/1928 Today's Date: 03/21/2012 Time: 1130-1200 OT Time Calculation (min): 30 min  OT Assessment / Plan / Recommendation Comments on Treatment Session Pt demonstrating improvemnt. functional ambulation with co treat with PT. Using rthymic "metronome" type movemnt technique to assist with gait patternand increasing safety with ambulaiton. Difficulty with initiation and apparent rigidity. R tremor present. Assessing use of R hand weight and weighted utensil to help with independet feeding. Recommend more finger foods to decrease spillage.    Follow Up Recommendations  Inpatient Rehab    Barriers to Discharge       Equipment Recommendations  Defer to next venue    Recommendations for Other Services Rehab consult  Frequency Min 2X/week   Plan Discharge plan remains appropriate    Precautions / Restrictions Precautions Precautions: Fall Restrictions Weight Bearing Restrictions: No   Pertinent Vitals/Pain none    ADL  ADL Comments: assisted with LB dressing    OT Diagnosis:    OT Problem List:   OT Treatment Interventions:     OT Goals Acute Rehab OT Goals OT Goal Formulation: With patient Time For Goal Achievement: 04/02/12 Potential to Achieve Goals: Good ADL Goals Pt Will Perform Grooming: with supervision;Sitting, edge of bed ADL Goal: Grooming - Progress: Progressing toward goals Pt Will Perform Upper Body Bathing: with min assist;Sitting, edge of bed ADL Goal: Upper Body Bathing - Progress: Progressing toward goals Pt Will Perform Upper Body Dressing: with supervision;Sitting, bed ADL Goal: Upper Body Dressing - Progress: Progressing toward goals Pt Will Transfer to Toilet: with mod assist;3-in-1;with DME ADL Goal: Toilet Transfer - Progress: Progressing toward goals Miscellaneous OT Goals Miscellaneous OT Goal #1: Pt will perform bed mobility with min assist to EOB in prep  for ADL. OT Goal: Miscellaneous Goal #1 - Progress: Progressing toward goals  Visit Information  Last OT Received On: 03/21/12 Assistance Needed: +2    Subjective Data      Prior Functioning  Home Living Lives With: Daughter;Other (Comment) (and son in law moved in to pt's home) Available Help at Discharge: Family;Available PRN/intermittently Type of Home: Mobile home Home Access: Ramped entrance Home Layout: One level Bathroom Shower/Tub: Tub/shower unit;Curtain Bathroom Toilet: Handicapped height Bathroom Accessibility: Yes How Accessible: Accessible via walker Home Adaptive Equipment: Straight cane;Grab bars in shower;Hand-held shower hose;Tub transfer bench;Wheelchair - powered Prior Function Level of Independence: Independent with assistive device(s) Driving: Yes Vocation: Retired Musician: No difficulties Dominant Hand: Right    Cognition  Overall Cognitive Status: Impaired Area of Impairment: Memory Arousal/Alertness: Awake/alert Orientation Level: Appears intact for tasks assessed Behavior During Session: WFL for tasks performed    Mobility Bed Mobility Bed Mobility: Rolling Right;Right Sidelying to Sit Rolling Right: 3: Mod assist Right Sidelying to Sit: 3: Mod assist Sitting - Scoot to Edge of Bed: 2: Max assist Details for Bed Mobility Assistance: (A) to initiate rolling and (A) to elevate trunk OOB with max cues to advance hips EOB with facilitation of proper weight shifts to advance hips Transfers Sit to Stand: 1: +2 Total assist;From bed;With upper extremity assist Sit to Stand: Patient Percentage: 60% Stand to Sit: 1: +2 Total assist;To bed;With upper extremity assist Stand to Sit: Patient Percentage: 60% Details for Transfer Assistance: VCs for hand placement and forward translation to complete sit <>stand   Exercises    Balance  mod A at times  End of Session OT - End of Session Activity Tolerance: Patient tolerated treatment  well Patient left: in chair;with call bell/phone within reach;with family/visitor present Nurse Communication: Mobility status  GO     Manmeet Arzola,HILLARY 03/21/2012, 6:31 PM [facility], OTR/L  [phone] 03/21/2012

## 2012-03-21 NOTE — Progress Notes (Signed)
Medical Student Daily Progress Note  Subjective: [name] Graham is feeling better today, but is still experiencing weakness in his legs, he says the left is weaker than the right. He has been unable to ambulate. He has also not had a BM since 8/15, and has some abdominal discomfort associated with this. He has been eating and swallowing with no difficulties and has been sleeping relatively well. He has a condom catheter 2/2 incontinence. The pain 2/2 his left gluteal abscess is resolving. No acute events. overnight. Objective: Vital signs in last 24 hours: Filed Vitals:   03/20/12 0538 03/20/12 1731 03/20/12 2013 03/21/12 0637  BP: 112/60 135/76 151/79 114/70  Pulse: 84 91 96 79  Temp: 98.9 F (37.2 C) 99.1 F (37.3 C) 99.6 F (37.6 C) 99 F (37.2 C)  TempSrc: Oral Oral Oral Oral  Resp: 20 18 18 16   Weight:      SpO2: 98% 96% 96% 93%   Weight change:   Intake/Output Summary (Last 24 hours) at 03/21/12 0940 Last data filed at 03/21/12 0926  Gross per 24 hour  Intake    720 ml  Output   1472 ml  Net   -752 ml   Physical Exam: Vitals reviewed. General: resting in bed, NAD HEENT: PERRL, EOMI, no scleral icterus Cardiac: tachycardic, irregularly irregular rhythm. 3/6 harsh systolic ejection murmer in all fields, no rubs or gallops Pulm: clear to auscultation bilaterally, no wheezes, rales, or rhonchi Abd: soft, nontender, mild distention, BS present Ext: warm and well perfused, no pedal edema Skin: Gluteal abscess has less inflammation and erythema from 8/17 exam. Neuro: alert and oriented X3, cranial nerves II-XII grossly intact with masked facies, strength and sensation to light touch equal in upper extremities. 4/5 strength in hips, knees. 5/5 strength in ankles. Tremor right hand>left. Cogwheel rigidity noted. Reflexes 1+ throughout.  Lab Results: Basic Metabolic Panel:  Lab 03/19/12 5784 03/18/12 1038 03/18/12 1030  NA 138 140 --  K 3.7 3.6 --  CL 102 101 --  CO2 27 31 --    GLUCOSE 99 109* --  BUN 22 22 --  CREATININE 1.04 1.16 --  CALCIUM 8.8 9.0 --  MG -- -- 2.2  PHOS -- -- --   CBC:  Lab 03/21/12 0515 03/19/12 0430 03/18/12 1038  WBC 7.3 8.6 --  NEUTROABS -- -- 4.9  HGB 12.0* 11.6* --  HCT 35.4* 34.8* --  MCV 98.1 99.1 --  PLT 181 146* --    Coagulation:  Lab 03/21/12 0515 03/20/12 0645 03/19/12 0430 03/18/12 1038  LABPROT 25.0* 25.3* 25.1* 29.0*  INR 2.22* 2.26* 2.23* 2.69*   Medications: I have reviewed the patient's current medications. Scheduled Meds:    . allopurinol  300 mg Oral Daily  . aspirin  81 mg Oral Daily  . carbidopa-levodopa  1 tablet Oral TID  . furosemide  40 mg Oral Daily  . levothyroxine  175 mcg Oral Daily  . metoprolol succinate  12.5 mg Oral Daily  . omega-3 acid ethyl esters  1 g Oral BID  . pantoprazole  40 mg Oral Q1200  . pneumococcal 23 valent vaccine  0.5 mL Intramuscular Tomorrow-1000  . polyethylene glycol  17 g Oral Daily  . senna-docusate  2 tablet Oral BID  . simvastatin  10 mg Oral q1800  . sucralfate  1 g Oral BID  . warfarin  2.5 mg Oral Q T,Th,S,Su-1800  . warfarin  5 mg Oral Q M,W,F-1800  . Warfarin - Physician Dosing Inpatient  Does not apply q1800  . DISCONTD: carbidopa-levodopa  1 tablet Oral BID  . DISCONTD: carbidopa-levodopa  2 tablet Oral BID   Continuous Infusions:  PRN Meds:.LORazepam, magnesium hydroxide Assessment/Plan: Principal Problem:  *Weakness of both legs: Essentially unchanged from exam on 8/17, we still believe this is a deconditioning+Parkinson's process. Neurology has seen and started [name] Graham on Sinemet, they are monitoring his response to this. He will almost certainly need high level rehabilitation. There was concern from nursing that he may not qualify for this. If he does not, consider moving to true telemetry bed. - Appreciate Neuro recommendations - Inpatient rehab consult has occurred according to the daughter. Awaiting their recommendations - Appreciate  PT/OT recs and ongoing care  Active Problems: 1. Parkinsons disease: According to his daughter, the patient had an episode of agitation and violence, went to a health care facility and was placed on an anti-psychotic agent for presumed agitation 2/2 schizophrenia. This has almost certainly worsened his parkinson's. Neuro started Sinemet on 8/17, which he has tolerated well to date. - Appreciate neuro recs - d/c invega on discharge  2. Constipation: Patient has not had a BM since 8/15 but has been passing flatus and has + BS. No abdominal pain - Per neuro: order enema - Continue current bowel regimen (per neuro, these doses have been maximized for patients with PD).  3. Gluteal abscess: Improving symptomatically and on exam. - warm compresses Q4H, continue wound checks  4. Urinary Incontinence: PVR performed on 8/18 showed residual of 22 mL, ruling out overflow incontinence. Most likely 2/2 his UTI and acute decompensation. Condom catheter is not causing discomfort. - Continue condom catheter, monitor patients progression - consider urology consult if patient remains incontinent at discharge  5. Atrial fibrillation/ AORTIC STENOSIS/ INSUFFICIENCY, NON-RHEUMATIC: Pts INR is within therapeutic range. He had a run of vtachx7beats on Friday ngiht/saturday morning. PVCs have occurred since, but no vtach. He has been restarted on his metoprolol and has been asymptomatic during this hospitalization. - Continue warfarin regimen - Consider move to telemetry bed if he is not approved from inpatient rehab  6. HYPERTENSION, BENIGN: bp has ranged between 114/70-151/79 in the last 24 hours. - Plan to continue holding bp meds except for metoprolol for now.   Dispo: Inpatient, pending rehab evaluation   LOS: 3 days   This is a Psychologist, occupational Note.  The care of the patient was discussed with Dr. Tonny Branch and the assessment and plan formulated with their assistance.  Please see their attached note for  official documentation of the daily encounter.  Luretha Rued 03/21/2012, 9:40 AM

## 2012-03-22 ENCOUNTER — Ambulatory Visit: Payer: Medicare Other | Admitting: Cardiology

## 2012-03-22 LAB — UIFE/LIGHT CHAINS/TP QN, 24-HR UR
Alpha 2, Urine: DETECTED — AB
Free Kappa Lt Chains,Ur: 16.2 mg/dL — ABNORMAL HIGH (ref 0.14–2.42)
Free Kappa/Lambda Ratio: 15 ratio — ABNORMAL HIGH (ref 2.04–10.37)
Total Protein, Urine: 18.9 mg/dL

## 2012-03-22 LAB — PROTIME-INR: INR: 2.11 — ABNORMAL HIGH (ref 0.00–1.49)

## 2012-03-22 MED ORDER — WARFARIN - PHARMACIST DOSING INPATIENT: Freq: Every day | Status: DC

## 2012-03-22 NOTE — Progress Notes (Signed)
Rehab admissions - Evaluated for possible admission.  I spoke with patient and his daughter.  They are interested in inpatient rehab prior to home with daughter.  I have faxed information to [employer] insurance carrier and will await response.  May be able to admit to inpatient rehab later today versus tomorrow.  Call me for questions.  #[phone]

## 2012-03-22 NOTE — Progress Notes (Signed)
ANTICOAGULATION CONSULT NOTE - Initial Consult  Pharmacy Consult for Coumadin Indication: atrial fibrillation and hx of bilateral pulmonary embolus  Allergies  Allergen Reactions  . Morphine And Related     Goes Crazy  . Nifedipine Other (See Comments)    "makes me go crazy"  . Nitroglycerin     Patient Measurements: Height: 6' 0.05" (183 cm) Weight: 269 lb 10 oz (122.3 kg) IBW/kg (Calculated) : 77.71   Vital Signs: Temp: 99.4 F (37.4 C) (08/20 0603) BP: 110/60 mmHg (08/20 0603) Pulse Rate: 87  (08/20 0603)  Labs:  [name] Graham 03/22/12 0524 03/21/12 0515 03/20/12 0645  HGB -- 12.0* --  HCT -- 35.4* --  PLT -- 181 --  APTT -- -- --  LABPROT 24.0* 25.0* 25.3*  INR 2.11* 2.22* 2.26*  HEPARINUNFRC -- -- --  CREATININE -- -- --  CKTOTAL -- -- --  CKMB -- -- --  TROPONINI -- -- --    Estimated Creatinine Clearance: 76.5 ml/min (by C-G formula based on Cr of 1.04).   Medical History: Past Medical History  Diagnosis Date  . Pulmonary embolus     bilateral  . Hypothyroid   . Hypertension   . CAD (coronary artery disease)   . Chronic atrial fibrillation     INR therpeutic on Coumadin  . Dementia     hallucinations status post psychiatric evaluation during admission  . Kidney cyst, acquired   . A-fib   . CAD (coronary artery disease)     cath 2006, no stents, ejection fraction  . Leukocytosis   . Tremor   . Parkinson disease    Assessment:   Admitted 8/16 with bilateral leg weakness.   Home Coumadin regimen of 5 mg MWF and 2.5 mg TTSS continued on admission per MD.  INRs have been therapeutic, though have trended down since 2.69 on admit. 8/18 Coumadin dose inadvertently missed, as Coumadin entries defaulted to stop after 1 dose.  Entries updated 8/19 and currently ongoing.  Rx to assume Coumadin dosing today.  Goal of Therapy:  INR 2-3 Monitor platelets by anticoagulation protocol: Yes   Plan:    Continue home Coumadin regimen as ordered:  5 mg MWF, 2.5 mg  TTSS.   Continue daily PT/INR for now.  Will decrease to less frequent checks INR soon, if INR stays within target range.    Dennie Fetters, RPh Pager: [phone] 03/22/2012,11:46 AM

## 2012-03-22 NOTE — Progress Notes (Signed)
Resident Co-sign Daily Note: I have seen the patient and reviewed the daily progress note by Kathrin Ruddy MS IV and discussed the care of the patient with them.  See below for documentation of my findings, assessment, and plans.  Subjective: Doing well today.  Had two BM yesterday and states that his stomach feels better.  Was up walking with PT and felt good.  Went about 100 ft and said he could have kept going.  Daughter at bedside said she thinks he is much better.   Objective: Vital signs in last 24 hours: Filed Vitals:   03/21/12 1204 03/21/12 1300 03/21/12 2211 03/22/12 0603  BP: 167/92 134/67 143/62 110/60  Pulse: 110 94 82 87  Temp:  98.4 F (36.9 C) 99 F (37.2 C) 99.4 F (37.4 C)  TempSrc:      Resp:  18 21 20   Height:      Weight:    269 lb 10 oz (122.3 kg)  SpO2:  96% 98% 95%   Physical Exam: Vitals reviewed. General: resting in bed, NAD HEENT: PERRL, EOMI, no scleral icterus Cardiac: Irregularly irregular rate and rhythm, 4/6 systolic murmur with radiation to the carotids. no rubs, or gallops Pulm: clear to auscultation bilaterally, no wheezes, rales, or rhonchi Abd: soft, nontender, nondistended, BS present Ext: warm and well perfused, no pedal edema Neuro: alert and oriented X3, cranial nerves II-XII grossly intact, Sensation to light touch equal in bilateral upper and lower extremities, BUE strength 5/5 with some cogwheel rigidity, BLE 4+/5 strength, improved from yesterday.   Lab Results: Reviewed and documented in Electronic Record Micro Results: Reviewed and documented in Electronic Record Studies/Results: Reviewed and documented in Electronic Record Medications: I have reviewed the patient's current medications. Scheduled Meds:   . allopurinol  300 mg Oral Daily  . aspirin  81 mg Oral Daily  . carbidopa-levodopa  1 tablet Oral TID  . furosemide  40 mg Oral Daily  . levothyroxine  175 mcg Oral Daily  . metoprolol succinate  12.5 mg Oral Daily  .  omega-3 acid ethyl esters  1 g Oral BID  . pantoprazole  40 mg Oral Q1200  . pneumococcal 23 valent vaccine  0.5 mL Intramuscular Tomorrow-1000  . polyethylene glycol  17 g Oral Daily  . senna-docusate  2 tablet Oral BID  . simvastatin  10 mg Oral q1800  . sucralfate  1 g Oral BID  . warfarin  2.5 mg Oral Q T,Th,S,Su-1800  . warfarin  5 mg Oral Q M,W,F-1800  . Warfarin - Pharmacist Dosing Inpatient   Does not apply q1800  . DISCONTD: Warfarin - Physician Dosing Inpatient   Does not apply q1800   Continuous Infusions:  PRN Meds:.Glycerin (Adult), LORazepam  Assessment/Plan: 1. Weakness of both legs: ON admission he has bilateral weakness of his legs as well as some mild altered mental status. His daughter is his primary caregiver and she is present today. He has had progressive weakness that has been getting slowly worse over the last few months but really started getting worse in the past week, concomitantly with his UTI. In addition, the "drop-off" in his strength and mental status came after learning that his wife was getting another surgery. Deconditioning 2/2 to decreased activity and staying with his wife in the hospital is likely playing a role in his weakness along with worsening Parkinsonian symptoms. Weakness mildly improved today with definite decrease in rigidity since starting his medications. He has tolerated the increase of his Sinamet to  TID without side effects.  PT and OT continued to suggest Inpatient Rehab. Rehab MD saw him and think he would be a good candidate.  They are working to clear it through his insurance.  - Appreciate neurology recommendations, will work to have them follow up as an outpatient.   - PT/OT recs include inpatient rehab   - Plan to D/C to rehab pending insurance approval.   2. Parkinson's Disease: According to his daughter, the patient had an episode of agitation and violence, went to a health care facility and was placed on an anti-psychotic agent for  presumed agitation 2/2 schizophrenia. This has almost certainly worsened his parkinson's. Neuro saw him yesterday and started Sinemet at low dose with slow titration. He has tolerated this well   - Appreciate neuro recs regarding medications, if he has hallucinations   - [employer] [facility]   - Provided daughter with reading material for patients regarding caring for patients with   Parkinson's   3. Urinary Incontinence: The patient says this was an issue when he was first diagnosed with a UTI one week prior to admission and that cipro helped to alleviate his incontinence. However on admission he had some increased incontinence.  He says when he is taking lasix he can go up to 20 times a day and never feel it coming. He also had a feeling of his bladder not being empty when he is done urinating.  No dysuria or hematuria. Ddx for this condition includes overflow incontinence 2/2 BPH vs a inability to get to a bathroom before urination vs. Relation to his parkinson's progression.  PVR was 22 mls so this is not a retention issue. There is no convincing findings on the UA. It is more likely secondary to the inability to get up to the bathroom fast enough. Condom cath in place to protect skin.   4. Gluteal abscess: No fevers overnight, the patient says the area still feels tender but the pain has improved since yesterday. Lesion is fluctuant and less erythematous today.   - Warm compresses Q4H with concurrent dressing changes   5. AORTIC STENOSIS/ INSUFFICIENCY: Per Dr. Rosalyn Charters records, Mr. Masden is a poor candidate for TAVR. His metoprolol dose was decreased due to hypotension and has been controlling his rate as well as his blood pressure. He may need increased dose as he gets to be more active again.   - Start metoprolol 50 mg PO QD. His bp has been in the 120s/70s. Will be protective for   future risk of ventricular tachycardia.   - Lasix at half his home dose.   6. Atrial fibrillation: Pt has been in  atrial fibrillation during this hospitalization. INR is therapeutic.   - Continue warfarin regimen   7. HYPERTENSION, BENIGN: bps on admission were low (100s/50s), but since have improved and have been running well during his stay on his current regimen of just Lasix and Metoprolol.   -Restart metoprolol at 50 mg PO QDay.   8. FEN/GI: 2 BM yesterday!  Eating well. Will continue senna with enema PRN. Will also give a glycerin suppository PRN as well.   - Continue current bowel regimen   9. Dispo: Inpatient rehab has seen and pending insurance approval.    LOS: 4 days   Faigy Stretch 03/22/2012, 11:49 AM

## 2012-03-22 NOTE — Progress Notes (Signed)
Internal Medicine Attending  Date: 03/22/2012  Patient name: [name] Graham Medical record number: [id number] Date of birth: August 28, 1928 Age: 78 y.o. Gender: male  I saw and evaluated the patient on a.m. rounds with house staff; see the resident note by Dr. Tonny Branch for details of clinical findings and plans.  Patient's strength is improving, and he was able to ambulate in the hall with physical therapy assistance yesterday.  His constipation has improved following an enema.  If his urinary incontinence does not improve with treatment of his Parkinson's, then an elective urology evaluation would be appropriate; we discussed this with the patient and his daughter.  The plan is to continue medical management of his Parkinson's disease as per neurology recommendations, and arrangements are being made for inpatient rehabilitation.

## 2012-03-22 NOTE — Progress Notes (Signed)
Subjective: Patient seen at the bedside, he is awake, alert and follows commands. No complications overnight Had BM last night X2.   Tolerating Sinemet. No hallucinations.  Patient tolerated PT/OT and actually walked over 150 ft yesterday  Which he has not done for quiet long time. No fever, no dysphagia.  Objective: Current vital signs: BP 110/60  Pulse 87  Temp 99.4 F (37.4 C) (Oral)  Resp 20  Ht 6' 0.05" (1.83 m)  Wt 122.3 kg (269 lb 10 oz)  BMI 36.52 kg/m2  SpO2 95% Vital signs in last 24 hours: Temp:  [98.4 F (36.9 C)-99.4 F (37.4 C)] 99.4 F (37.4 C) (08/20 0603) Pulse Rate:  [82-110] 87  (08/20 0603) Resp:  [18-21] 20  (08/20 0603) BP: (110-167)/(60-92) 110/60 mmHg (08/20 0603) SpO2:  [95 %-98 %] 95 % (08/20 0603) Weight:  [122.3 kg (269 lb 10 oz)] 122.3 kg (269 lb 10 oz) (08/20 0603)  Intake/Output from previous day: 08/19 0701 - 08/20 0700 In: 240 [P.O.:240] Out: 1500 [Urine:1500] Intake/Output this shift:   Nutritional status: General  Neurologic Exam: Patient awake, alert and follows commands.  Able to smile and dysphagia has significantly improved Resting tremors and rigidity has been improving Tolerating PT/OT Sensory intact No other cranial nerve or visual deficits  Lab Results: Results for orders placed during the hospital encounter of 03/18/12 (from the past 48 hour(s))  PROTIME-INR     Status: Abnormal   Collection Time   03/21/12  5:15 AM      Component Value Range Comment   Prothrombin Time 25.0 (*) 11.6 - 15.2 seconds    INR 2.22 (*) 0.00 - 1.49   CBC     Status: Abnormal   Collection Time   03/21/12  5:15 AM      Component Value Range Comment   WBC 7.3  4.0 - 10.5 K/uL    RBC 3.61 (*) 4.22 - 5.81 MIL/uL    Hemoglobin 12.0 (*) 13.0 - 17.0 g/dL    HCT 91.4 (*) 78.2 - 52.0 %    MCV 98.1  78.0 - 100.0 fL    MCH 33.2  26.0 - 34.0 pg    MCHC 33.9  30.0 - 36.0 g/dL    RDW 95.6  21.3 - 08.6 %    Platelets 181  150 - 400 K/uL   PROTIME-INR      Status: Abnormal   Collection Time   03/22/12  5:24 AM      Component Value Range Comment   Prothrombin Time 24.0 (*) 11.6 - 15.2 seconds    INR 2.11 (*) 0.00 - 1.49     No results found for this or any previous visit (from the past 240 hour(s)).  Lipid Panel No results found for this basename: CHOL,TRIG,HDL,CHOLHDL,VLDL,LDLCALC in the last 72 hours  Studies/Results: No results found.  Medications:   Scheduled:   . allopurinol  300 mg Oral Daily  . aspirin  81 mg Oral Daily  . carbidopa-levodopa  1 tablet Oral TID  . furosemide  40 mg Oral Daily  . levothyroxine  175 mcg Oral Daily  . metoprolol succinate  12.5 mg Oral Daily  . omega-3 acid ethyl esters  1 g Oral BID  . pantoprazole  40 mg Oral Q1200  . pneumococcal 23 valent vaccine  0.5 mL Intramuscular Tomorrow-1000  . polyethylene glycol  17 g Oral Daily  . senna-docusate  2 tablet Oral BID  . simvastatin  10 mg Oral q1800  . sucralfate  1 g Oral BID  . warfarin  2.5 mg Oral Q T,Th,S,Su-1800  . warfarin  5 mg Oral Q M,W,F-1800  . Warfarin - Physician Dosing Inpatient   Does not apply q1800  . DISCONTD: carbidopa-levodopa  2 tablet Oral BID    Recommendations:  Please continue with the Sinemet  Will need Inpatient rehab and they have to evaluate On/OFF  Sinemet  Treatment  No further Neurology input Will sing off  Christo Hain V-P Eilleen Kempf., MD., Ph.D.,MS 03/22/2012 9:37 AM

## 2012-03-22 NOTE — Progress Notes (Signed)
UR COMPLETED  

## 2012-03-22 NOTE — Progress Notes (Signed)
Medical Student Daily Progress Note  Subjective: [name] Graham had two bowel movements yesterday after receiving an enema and a laxative. His appetite is good. He can feel when he needs to urinate but is unsure if he's able to hold his urine or not (he has a condom catheter in).  He describes having an episode of "burning up" for a few hours last night that kept him up. No fevers noted. He walked ~100 ft yesterday with PT, saying "I could've gone further." No pain from his left gluteal abscess. No chest pain. Objective: Vital signs in last 24 hours: Filed Vitals:   03/21/12 1204 03/21/12 1300 03/21/12 2211 03/22/12 0603  BP: 167/92 134/67 143/62 110/60  Pulse: 110 94 82 87  Temp:  98.4 F (36.9 C) 99 F (37.2 C) 99.4 F (37.4 C)  TempSrc:      Resp:  18 21 20   Height:      Weight:    122.3 kg (269 lb 10 oz)  SpO2:  96% 98% 95%   Weight change:   Intake/Output Summary (Last 24 hours) at 03/22/12 0806 Last data filed at 03/22/12 0606  Gross per 24 hour  Intake    240 ml  Output   1500 ml  Net  -1260 ml   Physical Exam: Vitals reviewed. General: resting in bed, NAD HEENT: PERRL, EOMI, no scleral icterus Cardiac: irregularly irregular rate and rhythm, no rubs, murmurs or gallops Pulm: clear to auscultation bilaterally, no wheezes, rales, or rhonchi Abd: soft, nontender, nondistended, BS present Ext: warm and well perfused, no pedal edema Neuro: alert and oriented X3, cranial nerves II-XII grossly intact, strength and sensation to light touch equal in bilateral upper extremities. Lower extremity strength at 4+/5 in the hips and knees, 5/5 in the ankles. Resting tremor R>L.  Lab Results: CBC:  Lab 03/21/12 0515 03/19/12 0430 03/18/12 1038  WBC 7.3 8.6 --  NEUTROABS -- -- 4.9  HGB 12.0* 11.6* --  HCT 35.4* 34.8* --  MCV 98.1 99.1 --  PLT 181 146* --    Coagulation:  Lab 03/22/12 0524 03/21/12 0515 03/20/12 0645 03/19/12 0430  LABPROT 24.0* 25.0* 25.3* 25.1*  INR 2.11* 2.22*  2.26* 2.23*  Medications: I have reviewed the patient's current medications. Scheduled Meds:   . allopurinol  300 mg Oral Daily  . aspirin  81 mg Oral Daily  . carbidopa-levodopa  1 tablet Oral TID  . furosemide  40 mg Oral Daily  . levothyroxine  175 mcg Oral Daily  . metoprolol succinate  12.5 mg Oral Daily  . omega-3 acid ethyl esters  1 g Oral BID  . pantoprazole  40 mg Oral Q1200  . pneumococcal 23 valent vaccine  0.5 mL Intramuscular Tomorrow-1000  . polyethylene glycol  17 g Oral Daily  . senna-docusate  2 tablet Oral BID  . simvastatin  10 mg Oral q1800  . sucralfate  1 g Oral BID  . warfarin  2.5 mg Oral Q T,Th,S,Su-1800  . warfarin  5 mg Oral Q M,W,F-1800  . Warfarin - Physician Dosing Inpatient   Does not apply q1800  . DISCONTD: carbidopa-levodopa  2 tablet Oral BID   Continuous Infusions:  PRN Meds:.Glycerin (Adult), LORazepam, DISCONTD: magnesium hydroxide Assessment/Plan: Principal Problem:  *Weakness of both legs: Deconditioning with The patient's strength has improved today on exam. He was able to ambulate down the hall yesterday. Per his daughter, the inpatient rehab team was confident he would be accepted for rehab.  - Continue inpatient  status on 5N with PT/OT until the rehab team has formally accepted him  Active Problems:  Parkinson's disease: Patient's PD symptoms appear largely unchanged outside of increasing strength (masked facies, resting tremor) - his rigidity is less pronounced today. He believes the "burning" episode last night was due to his PD drugs, but his daugter noted that he had a similar episode before even starting sinemet. Otherwise he is tolerating Sinemet well. -Continue following neuro recs  Urinary Incontinence: May be 2/2 Parkinson's-related dysautonomia (constipation, urinary incontinence, orthostatic hypotension, etc). Continue condom cath, encouraged patient to try and hold his urine if he feels it coming (which he is starting to).    HYPERTENSION, BENIGN: Well-controlled during this hospitalization. Dr. [name] has him on captopril 25 mg BID, which pharmacy reports is a relative contraindication for severe aortic stenosis. - Continue current anti-HTN regimen, consider d/cing    Atrial fibrillation: INR trending down to 2.11 today, most likely 2/2 him missing a dose on 03/20/12. This was due to an error in his initial dosing orders. - Continue current regimen, patient is still at goal for afib.  Constipation: resolved, continue aggressive bowel regimen.  DVT Ppx: Coumadin per pharmacy  Dispo: Awaiting recs from inpatient rehab, will d/c from our service when they accept [name] Graham.   LOS: 4 days   This is a Psychologist, occupational Note.  The care of the patient was discussed with Dr. Tonny Branch and the assessment and plan formulated with their assistance.  Please see their attached note for official documentation of the daily encounter.  Luretha Rued 03/22/2012, 8:06 AM

## 2012-03-22 NOTE — Progress Notes (Signed)
Physical Therapy Treatment Patient Details Name: [name] Graham MRN: [id number] DOB: 1929/01/02 Today's Date: 03/22/2012 Time: 1152-1222 PT Time Calculation (min): 30 min  PT Assessment / Plan / Recommendation Comments on Treatment Session  Pt able to increase ambulation distance with max cues for sequence.  Pt will continue to benefit from inpatient rehab to improve overall mobility.    Follow Up Recommendations  Inpatient Rehab;Supervision/Assistance - 24 hour    Barriers to Discharge        Equipment Recommendations  Defer to next venue    Recommendations for Other Services Rehab consult  Frequency Min 4X/week   Plan Discharge plan remains appropriate;Frequency remains appropriate    Precautions / Restrictions Precautions Precautions: Fall   Pertinent Vitals/Pain No c/o pain    Mobility  Bed Mobility Bed Mobility: Rolling Right;Right Sidelying to Sit Rolling Right: 3: Mod assist Right Sidelying to Sit: 3: Mod assist Sitting - Scoot to Edge of Bed: 2: Max assist Details for Bed Mobility Assistance: (A) to initiate rolling and (A) to elevate trunk OOB with max cues to advance hips EOB with facilitation of proper weight shifts to advance hips Transfers Transfers: Sit to Stand;Stand to Sit Sit to Stand: 1: +2 Total assist;From bed;With upper extremity assist Sit to Stand: Patient Percentage: 60% Stand to Sit: 1: +2 Total assist;To bed;With upper extremity assist Stand to Sit: Patient Percentage: 60% Details for Transfer Assistance: VCs for hand placement and forward translation to complete sit <>stand Ambulation/Gait Ambulation/Gait Assistance: 1: +2 Total assist Ambulation/Gait: Patient Percentage: 70% Ambulation Distance (Feet): 250 Feet Assistive device: Rolling walker Ambulation/Gait Assistance Details: +2 (A) for safety with chair to follow.  Max VC and Auditory cues (clapping) for rhythm to promote proper gait sequence. Gait Pattern: Step-to pattern;Decreased  stride length;Shuffle;Trunk flexed;Decreased trunk rotation    Exercises     PT Diagnosis:    PT Problem List:   PT Treatment Interventions:     PT Goals Acute Rehab PT Goals PT Goal Formulation: With patient Time For Goal Achievement: 04/02/12 Potential to Achieve Goals: Good Pt will go Supine/Side to Sit: with min assist PT Goal: Supine/Side to Sit - Progress: Progressing toward goal Pt will go Sit to Supine/Side: with min assist PT Goal: Sit to Supine/Side - Progress: Progressing toward goal Pt will go Sit to Stand: with mod assist PT Goal: Sit to Stand - Progress: Progressing toward goal Pt will go Stand to Sit: with mod assist PT Goal: Stand to Sit - Progress: Progressing toward goal Pt will Transfer Bed to Chair/Chair to Bed: with mod assist PT Transfer Goal: Bed to Chair/Chair to Bed - Progress: Progressing toward goal Pt will Ambulate: >150 feet;with supervision;with rolling walker PT Goal: Ambulate - Progress: Progressing toward goal  Visit Information  Last PT Received On: 03/22/12 Assistance Needed: +2    Subjective Data  Subjective: "I can walk further today." Patient Stated Goal: to get better and be able to walk   Cognition  Overall Cognitive Status: Impaired Area of Impairment: Memory Arousal/Alertness: Awake/alert Orientation Level: Appears intact for tasks assessed Behavior During Session: [facility] for tasks performed    Balance     End of Session PT - End of Session Equipment Utilized During Treatment: Gait belt Activity Tolerance: Patient tolerated treatment well Patient left: in chair;with call bell/phone within reach Nurse Communication: Mobility status   GP     Lavayah Vita 03/22/2012, 1:37 PM Jake Shark, PT DPT [phone]

## 2012-03-23 ENCOUNTER — Inpatient Hospital Stay (HOSPITAL_COMMUNITY): Payer: Medicare Other | Admitting: Speech Pathology

## 2012-03-23 ENCOUNTER — Inpatient Hospital Stay (HOSPITAL_COMMUNITY): Payer: Medicare Other | Admitting: Occupational Therapy

## 2012-03-23 ENCOUNTER — Encounter (HOSPITAL_COMMUNITY): Payer: Medicare Other | Admitting: Occupational Therapy

## 2012-03-23 ENCOUNTER — Inpatient Hospital Stay (HOSPITAL_COMMUNITY): Payer: Medicare Other | Admitting: Physical Therapy

## 2012-03-23 LAB — PROTIME-INR: Prothrombin Time: 25 seconds — ABNORMAL HIGH (ref 11.6–15.2)

## 2012-03-23 MED ORDER — METOPROLOL SUCCINATE ER 50 MG PO TB24
50.0000 mg | ORAL_TABLET | Freq: Every day | ORAL | Status: DC
  Administered 2012-03-23 – 2012-03-29 (×7): 50 mg via ORAL
  Filled 2012-03-23 (×7): qty 1

## 2012-03-23 MED ORDER — FLEET ENEMA 7-19 GM/118ML RE ENEM
1.0000 | ENEMA | Freq: Once | RECTAL | Status: AC
  Administered 2012-03-23: 1 via RECTAL
  Filled 2012-03-23: qty 1

## 2012-03-23 MED ORDER — GI COCKTAIL ~~LOC~~
30.0000 mL | Freq: Once | ORAL | Status: AC
  Administered 2012-03-23: 30 mL via ORAL
  Filled 2012-03-23: qty 30

## 2012-03-23 NOTE — Progress Notes (Signed)
Did receive insurance approval for CIR, however, family now states they want SNF at [facility] [for pt & his wife, who is also in hospital].  Lovette Cliche, SW is aware of this & will pursue SNF.  Therefore, we will not be admitting to Inpt Rehab today. [phone]

## 2012-03-23 NOTE — Clinical Social Work Psychosocial (Signed)
     Clinical Social Work Department BRIEF PSYCHOSOCIAL ASSESSMENT 03/23/2012  Patient:  Graham Graham     Account Number:  [id number]     Admit date:  03/18/2012  Clinical Social Worker:  Burnard Hawthorne  Date/Time:  03/23/2012 03:56 PM  Referred by:  Physician  Date Referred:  03/22/2012 Referred for  SNF Placement   Other Referral:   Interview type:  Other - See comment Other interview type:   Patient and son- Graham Graham    PSYCHOSOCIAL DATA Living Status:  WIFE Admitted from facility:   Level of care:   Primary support name:  Graham Graham   (c) [phone] Primary support relationship to patient:  CHILD, ADULT Degree of support available:   Wife is currently a patient at [facility]- in stepdown.    CURRENT CONCERNS Current Concerns  Post-Acute Placement   Other Concerns:   Son wants mother and father placed at [facility] [place]    SOCIAL WORK ASSESSMENT / PLAN 78 year old male referred to CSW for short term SNF. Patient is agreeable as he has been living with his wife who is currently a patient here at [facility].  Patient and his son are requesting placement at [facility] . Referral completed and sent to SNF's in [facility]. Existing [facility] in place.  Awaitng call back from [facility] regarding possible bed offer. Will plan SNF when stable per MD.   Assessment/plan status:  Psychosocial Support/Ongoing Assessment of Needs Other assessment/ plan:   Information/referral to community resources:   SNF bed list provided    PATIENTS/FAMILYS RESPONSE TO PLAN OF CARE: Patient and son are agreeable to short term SNF. Patient has mild dementia but is very pleasant and able to participate in d/c plan. Son is hoping his parents can be placed together in same SNF.  CSW will monitor.

## 2012-03-23 NOTE — Progress Notes (Signed)
Hr 138 when walking, pt. Asymptomatic. MD notified.

## 2012-03-23 NOTE — Progress Notes (Signed)
While on rounds asked by [facility] tele monitor tech to check on patient in 5N16 with elevated HR.  On arrival to unit patient sitting in chair - attending MD Dr. Meredith Pel at bedside.  Patient alert - denies pain, SOB or any discomfort.  Has bil UE tremors - hx Parkinson's disease.  W/D mentation well.  Placed back in bed.  HR Afib - chronic - rate 1114-140.  Patient has been up walking around.  RN Joni Reining at bedside - RN Claris Che to bedside.  Patient placed back in bed - stands fair but weak.  MD aware of HR - oral med's restarted.  HR in 110 range after in bed for 10 minutes.  Remains asx - lungs clear.  RN to call if needed.

## 2012-03-23 NOTE — Progress Notes (Signed)
Informed MD of HR 140's BR 132/84. New Orders given to monitor and call MD for HR over 120. Patient stable at this time RRT at bedside. 2L of O2 applied. Patient denies any complaints.

## 2012-03-23 NOTE — Progress Notes (Signed)
Pt. Stable. Vss.hr= 108 resting.Denies discomfort.MD informed per request.

## 2012-03-23 NOTE — Progress Notes (Signed)
Patient continues to have frequent multifocal PVC's and pairs returning to atrial fibrillation.  Patient is asymptomatic.  Vitals at 2045- BP 115/65, temp 99.6, HR 98, RR 18, pulse ox at 99% on 2L O2. Spoke with Dr. Lavena Bullion, no new orders received, will continue to monitor patient.

## 2012-03-23 NOTE — Progress Notes (Signed)
Please see my separate note

## 2012-03-23 NOTE — Progress Notes (Signed)
Rehab admissions - I continue to wait for insurance authorization for possible inpatient rehab admission.  I have called and left a message with insurance carrier this morning.  I hope to have an answer soon.  Call me for questions.  #[phone]

## 2012-03-23 NOTE — Progress Notes (Signed)
ANTICOAGULATION CONSULT NOTE - Follow Up Consult  Pharmacy Consult for Coumadin Indication: atrial fibrillation and hx of bilateral pulmonary embolus   Allergies  Allergen Reactions  . Morphine And Related     Goes Crazy  . Nifedipine Other (See Comments)    "makes me go crazy"  . Nitroglycerin    Vital Signs: Temp: 97.3 F (36.3 C) (08/21 0602) BP: 130/72 mmHg (08/21 0602) Pulse Rate: 115  (08/21 0602)  Labs:  Basename 03/23/12 0500 03/22/12 0524 03/21/12 0515  HGB -- -- 12.0*  HCT -- -- 35.4*  PLT -- -- 181  APTT -- -- --  LABPROT 25.0* 24.0* 25.0*  INR 2.22* 2.11* 2.22*  HEPARINUNFRC -- -- --  CREATININE -- -- --  CKTOTAL -- -- --  CKMB -- -- --  TROPONINI -- -- --   Estimated Creatinine Clearance: 77.3 ml/min (by C-G formula based on Cr of 1.04).  Assessment: 82yom continues on coumadin with a therapeutic INR. No CBC today. No bleeding noted. Ok to continue his home regimen and change INR checks to less frequent since he appears to be stable.  Goal of Therapy:  INR 2-3   Plan:  1) Coumadin 5mg  tonight as per home regimen 2) Change INR check to MWF  Fredrik Rigger 03/23/2012,9:09 AM

## 2012-03-23 NOTE — Progress Notes (Signed)
Medical Student Daily Progress Note  Subjective: [name] Graham is feeling diffuse abdominal pain today. He had no BMs yesterday or today, LBM was 8/19. Decreased flatus. He has not received any of his PRN constipation meds as of this note. HR increased to the 130s overngiht, but Mr. Hiney was asymptomatic during this period. Per rehabs note, he was accepted to inpatient rehabb but has elected to go to a SNF in [place]. Objective: Vital signs in last 24 hours: Filed Vitals:   03/22/12 1300 03/22/12 2152 03/23/12 0602 03/23/12 0619  BP: 128/72 124/66 130/72   Pulse: 105 111 115   Temp: 99.8 F (37.7 C) 98.4 F (36.9 C) 97.3 F (36.3 C)   TempSrc:      Resp: 18 21 21    Height:      Weight:    124.8 kg (275 lb 2.2 oz)  SpO2: 96%  98%    Weight change: 1 kg (2 lb 3.3 oz)  Intake/Output Summary (Last 24 hours) at 03/23/12 1200 Last data filed at 03/23/12 0900  Gross per 24 hour  Intake    480 ml  Output   1800 ml  Net  -1320 ml   Physical Exam: Vitals reviewed. General: resting in bed, NAD HEENT: PERRL, EOMI, no scleral icterus Cardiac: RRR, no rubs, murmurs or gallops Pulm: clear to auscultation bilaterally, no wheezes, rales, or rhonchi Abd: soft, nontender, nondistended, BS present Ext: warm and well perfused, no pedal edema Neuro: alert and oriented X3, cranial nerves II-XII grossly intact, strength and sensation to light touch equal in upper extremities. 4+/5 strength in hips and knees. 5/5 strength in ankles. Moderate rigidity on exam. Hand tremor R>L  Lab Results: Coagulation:  Lab 03/23/12 0500 03/22/12 0524 03/21/12 0515 03/20/12 0645  LABPROT 25.0* 24.0* 25.0* 25.3*  INR 2.22* 2.11* 2.22* 2.26*   Medications: I have reviewed the patient's current medications. Scheduled Meds:   . allopurinol  300 mg Oral Daily  . aspirin  81 mg Oral Daily  . carbidopa-levodopa  1 tablet Oral TID  . furosemide  40 mg Oral Daily  . gi cocktail  30 mL Oral Once  . levothyroxine  175  mcg Oral Daily  . metoprolol succinate  50 mg Oral Daily  . omega-3 acid ethyl esters  1 g Oral BID  . pantoprazole  40 mg Oral Q1200  . polyethylene glycol  17 g Oral Daily  . senna-docusate  2 tablet Oral BID  . simvastatin  10 mg Oral q1800  . sucralfate  1 g Oral BID  . warfarin  2.5 mg Oral Q T,Th,S,Su-1800  . warfarin  5 mg Oral Q M,W,F-1800  . Warfarin - Pharmacist Dosing Inpatient   Does not apply q1800  . DISCONTD: metoprolol succinate  12.5 mg Oral Daily   Continuous Infusions:  PRN Meds:.Glycerin (Adult), LORazepam Assessment/Plan: Principal Problem:  *Weakness of both legs: Strength has improved to 4+/5 in his knees and hips, the patient is walking well with PT.  -Appreciate PT recommendations on therapy - Will receive rehab at SNF when he is transferred  Active Problems:  Parkinson's disease: The patient has not had any side effects in regards to his sinemet. It is improtant to note that neurology has signed off on him, stating that if he has any hallucinations in the future that treatment with seroquel will be indicated. - Pts preference for a primary neurologist is one in the [place] area.  Constipation: Mr. Space abdomen was nontender, but he has  diffuse pain that he believes is related to his constipation. This process could be 2/2 autonomic dysfunction with Parkinson's disease vs. Starting sinemet. LBM 8/19. - Pt has not received PRNs today, informed nursing to give him a full course of PRNs for constipation. - If this is not successful, consider another dose of MOM along with a repeat liquid enema.  HTN: Dr. [name] reported that Mr. Viviani has been on an ACEI since before his AS worsened. He is not concerned with the relative contraindication ACEI's have in terms of decreasing coronary perfusion. His concern with stopping the ACEI is Mr. Solum K status. If he does not get an ACEI, he will need K supplementation since he is taking Lasix. Per nursing, his bps increase  significantly with exertion (150s-170s systolic). - restart captopril at home dose, dc with normal home meds - f/u BMP at SNF - f/u appt with Dr. [name] needs to be scheduled.   Atrial fibrillation: INR is therapeutic at 2.22 today. Mr. Thieme HR increased to the 130s overnight without symptoms. Apparently, the team failed to move him to his normal metoprolol home dose. We have increased to his home dose of 50 mg today for rate control.   Urinary Incontinence: Patient is still having difficulty controlling his urine. No changes since admission. - Continue condom cath - Will need urology consult, can be done outpatient  Gluteal Abscess: Per the patient, the abscess is still painful, but the pain has been improving progressively. He has been afebrile during his hospitalization - Continue wound checks, warm compresses, and dressing changes  Dispo: D/C to SNF pending case management's ability to find a spot. D/c most likely tomorrow per SW. - F/u Neurology apt - f/u appt with Dr. [name]    LOS: 5 days   This is a Medical Student Note.  The care of the patient was discussed with Dr. Anselm Jungling and the assessment and plan formulated with their assistance.  Please see their attached note for official documentation of the daily encounter.  Luretha Rued 03/23/2012, 12:00 PM

## 2012-03-23 NOTE — Clinical Social Work Placement (Signed)
     Clinical Social Work Department CLINICAL SOCIAL WORK PLACEMENT NOTE 03/23/2012  Patient:  [name] Graham, [name] Graham  Account Number:  [id number] Admit date:  03/18/2012  Clinical Social Worker:  Lupita Leash Goebel Hellums, BSW  Date/time:  03/23/2012 04:04 PM  Clinical Social Work is seeking post-discharge placement for this patient at the following level of care:   SKILLED NURSING   (*CSW will update this form in Epic as items are completed)   03/23/2012  Patient/family provided with [facility] Department of Clinical Social Works list of facilities offering this level of care within the geographic area requested by the patient (or if unable, by the patients family).  03/23/2012  Patient/family informed of their freedom to choose among providers that offer the needed level of care, that participate in Medicare, Medicaid or managed care program needed by the patient, have an available bed and are willing to accept the patient.  03/16/2012  Patient/family informed of [facility] ownership interest in [facility], as well as of the fact that they are under no obligation to receive care at this facility.  PASARR submitted to EDS on  PASARR number received from EDS on   FL2 transmitted to all facilities in geographic area requested by pt/family on  03/23/2012 FL2 transmitted to all facilities within larger geographic area on   Patient informed that his/her managed care company has contracts with or will negotiate with  certain facilities, including the following:   Has Conservation officer, historic buildings. SNF search in [facility]     Patient/family informed of bed offers received:   Patient chooses bed at  Physician recommends and patient chooses bed at    Patient to be transferred to  on   Patient to be transferred to facility by Ambulance  The following physician request were entered in Epic:   Additional Comments:

## 2012-03-23 NOTE — Progress Notes (Signed)
Physical Therapy Treatment Patient Details Name: [name] Graham MRN: [id number] DOB: 1929-07-04 Today's Date: 03/23/2012 Time: 1308-6578 PT Time Calculation (min): 25 min  PT Assessment / Plan / Recommendation Comments on Treatment Session  Limited ambulation due to increase heart rate to 160s.  Pt return to sitting and HR quickly returned to 100s.  Pt was return to room and asymptomatic throughout session.    Follow Up Recommendations  Inpatient Rehab;Supervision/Assistance - 24 hour    Barriers to Discharge        Equipment Recommendations  Defer to next venue    Recommendations for Other Services Rehab consult  Frequency Min 4X/week   Plan Discharge plan remains appropriate;Frequency remains appropriate    Precautions / Restrictions Precautions Precautions: Fall   Pertinent Vitals/Pain No c/o pain    Mobility  Bed Mobility Bed Mobility: Rolling Right;Right Sidelying to Sit Rolling Right: 3: Mod assist Right Sidelying to Sit: 3: Mod assist Sitting - Scoot to Edge of Bed: 3: Mod assist Details for Bed Mobility Assistance: (A) to initiate rolling and (A) to elevate trunk OOB with max cues to advance hips EOB with facilitation of proper weight shifts to advance hips Transfers Transfers: Sit to Stand;Stand to Sit Sit to Stand: 3: Mod assist Stand to Sit: 3: Mod assist Details for Transfer Assistance: VCs for hand placement with (A) to initiate transfer and slowly descend to recliner Ambulation/Gait Ambulation/Gait Assistance: 4: Min assist Ambulation Distance (Feet): 200 Feet Assistive device: Rolling walker Ambulation/Gait Assistance Details: (A) to to manage RW with cues for correct body position within RW and promote upright posture.  Max VCs and auditory cues for correct step sequence and rhythm. Gait Pattern: Step-to pattern;Decreased stride length;Shuffle;Trunk flexed;Decreased trunk rotation    Exercises     PT Diagnosis:    PT Problem List:   PT Treatment  Interventions:     PT Goals Acute Rehab PT Goals PT Goal Formulation: With patient Time For Goal Achievement: 04/02/12 Potential to Achieve Goals: Good Pt will go Supine/Side to Sit: with min assist PT Goal: Supine/Side to Sit - Progress: Progressing toward goal Pt will go Sit to Supine/Side: with min assist PT Goal: Sit to Supine/Side - Progress: Progressing toward goal Pt will go Sit to Stand: with mod assist PT Goal: Sit to Stand - Progress: Progressing toward goal Pt will go Stand to Sit: with mod assist PT Goal: Stand to Sit - Progress: Progressing toward goal Pt will Transfer Bed to Chair/Chair to Bed: with mod assist PT Transfer Goal: Bed to Chair/Chair to Bed - Progress: Met Pt will Ambulate: >150 feet;with supervision;with rolling walker PT Goal: Ambulate - Progress: Progressing toward goal  Visit Information  Last PT Received On: 03/23/12 Assistance Needed: +1    Subjective Data      Cognition  Overall Cognitive Status: Impaired Area of Impairment: Memory Arousal/Alertness: Awake/alert Orientation Level: Appears intact for tasks assessed Behavior During Session: [facility] for tasks performed    Balance     End of Session PT - End of Session Equipment Utilized During Treatment: Gait belt Activity Tolerance: Patient tolerated treatment well Patient left: in chair;with call bell/phone within reach Nurse Communication: Mobility status   GP     Lindee Leason 03/23/2012, 2:40 PM Edie, PT DPT [phone]'

## 2012-03-23 NOTE — Progress Notes (Signed)
Internal Medicine Attending  Date: 03/23/2012  Patient name: [name] Graham Medical record number: [id number] Date of birth: 08-15-1928 Age: 78 y.o. Gender: male  I saw and evaluated the patient, and discussed his care with house staff; see the note by resident Dr. Anselm Jungling for details of clinical findings and plans.  Patient reports some ongoing constipation, but otherwise has no new complaint today.  His heart rate has been somewhat elevated, with resting rates in the 110-120 range and as high as 140 with activity; agree with plan to increase his Metroprolol XL to his home dose of 50 mg daily, with the first dose given this a.m.Marland Kitchen

## 2012-03-23 NOTE — Progress Notes (Addendum)
Resident Co-sign Daily Note: I have seen the patient and reviewed the daily progress note by  MS-[name] Graham and discussed the care of the patient with them.  See below for documentation of my findings, assessment, and plans.  Subjective: He feels ok this morning except for some abdominal discomfort and sour taste in his mouth/heart burn.  He states that Maalox does not work for him.  He has not had a bowel movement today.  He has not worked with physical therapy today.  He states that his son would like to take him to [place] not inpatient rehab here?  Objective: Vital signs in last 24 hours: Filed Vitals:   03/22/12 1300 03/22/12 2152 03/23/12 0602 03/23/12 0619  BP: 128/72 124/66 130/72   Pulse: 105 111 115   Temp: 99.8 F (37.7 C) 98.4 F (36.9 C) 97.3 F (36.3 C)   TempSrc:      Resp: 18 21 21    Height:      Weight:    275 lb 2.2 oz (124.8 kg)  SpO2: 96%  98%    Physical Exam: General: alert, well-developed, and cooperative to examination.   Lungs: normal respiratory effort, no accessory muscle use, normal breath sounds, no crackles, and no wheezes. Heart: bradycardic, irregular rhythm, +3 systolic murmur throughout precordium, no gallop, and no rub.  Abdomen: soft, mild tenderness to palpation of epigastrium and LUQ, normal bowel sounds, no distention, no guarding, no rebound tenderness Pulses: 2+ DP/PT pulses bilaterally Extremities: No cyanosis, clubbing, edema Neurologic:nonfocal except for tremors in hands   Lab Results: Reviewed and documented in Electronic Record Micro Results: Reviewed and documented in Electronic Record Studies/Results: Reviewed and documented in Electronic Record Medications: medication reviewed Scheduled Meds:   . allopurinol  300 mg Oral Daily  . aspirin  81 mg Oral Daily  . carbidopa-levodopa  1 tablet Oral TID  . furosemide  40 mg Oral Daily  . gi cocktail  30 mL Oral Once  . levothyroxine  175 mcg Oral Daily  . metoprolol  succinate  50 mg Oral Daily  . omega-3 acid ethyl esters  1 g Oral BID  . pantoprazole  40 mg Oral Q1200  . polyethylene glycol  17 g Oral Daily  . senna-docusate  2 tablet Oral BID  . simvastatin  10 mg Oral q1800  . sucralfate  1 g Oral BID  . warfarin  2.5 mg Oral Q T,Th,S,Su-1800  . warfarin  5 mg Oral Q M,W,F-1800  . Warfarin - Pharmacist Dosing Inpatient   Does not apply q1800  . DISCONTD: metoprolol succinate  12.5 mg Oral Daily   Continuous Infusions:  PRN Meds:.Glycerin (Adult), LORazepam Assessment/Plan:  1. Weakness of both legs: ON admission he has bilateral weakness of his legs as well as some mild altered mental status. His daughter is his primary caregiver and she is present today. He has had progressive weakness that has been getting slowly worse over the last few months but really started getting worse in the past week, concomitantly with his UTI. In addition, the "drop-off" in his strength and mental status came after learning that his wife was getting another surgery. Deconditioning 2/2 to decreased activity and staying with his wife in the hospital is likely playing a role in his weakness along with worsening Parkinsonian symptoms. Weakness continues to improve. He has tolerated the increase of his Sinamet to TID without side effects. PT and OT continued to suggest Inpatient Rehab. Rehab MD saw him and think he would  be a good candidate.  - Appreciate neurology recommendations, will work to have them follow up as an outpatient.  - PT/OT recs include inpatient rehab  - Plan to D/C to rehab pending insurance approval vs going to SNF in [place]?.   2. Parkinson's Disease: According to his daughter, the patient had an episode of agitation and violence, went to a health care facility and was placed on an anti-psychotic agent for presumed agitation 2/2 schizophrenia. This has almost certainly worsened his parkinson's.  - Appreciate neuro recs regarding medications, if he has  hallucinations -Continue Sinemet  - continue holding Invega    3. Urinary Incontinence: overflow incontinence 2/2 BPH vs a inability to get to a bathroom before urination vs. Relation to his parkinson's progression. -continue Condom cath in place to protect skin.   4. Gluteal abscess: No fevers overnight, stable - Warm compresses Q4H with concurrent dressing changes   5. AORTIC STENOSIS/ INSUFFICIENCY: Per Dr. Rosalyn Charters records, [name] Graham is a poor candidate for TAVR. His metoprolol dose was decreased due to hypotension and has been controlling his rate as well as his blood pressure. He may need increased dose as he gets to be more active again.  - Continue metoprolol and Lasix.   6. Atrial fibrillation: Pt has been in atrial fibrillation during this hospitalization. INR is therapeutic. HR 110's today, we resumed his metoprolol yesterday, may need to increase dosage tomorrow - Continue warfarin regimen   7. HYPERTENSION, BENIGN: stable BP 132/72 -Continue metoprolol at 50 mg PO QDay. HR is slightly up today to 110's. Metoprolol was just resumed yesterday  8. FEN/GI: No BM today. Will continue senna with enema PRN. Will also give a glycerin suppository PRN as well.  - Continue current bowel regimen  -Will give GI cocktail for abdominal discomfort- this could be due to reflux -Continue PPI  9. Dispo: Inpatient rehab has seen and pending insurance approval; however, patient reports that "my son might take me to [place]"? Will need to clarify, appreciate SW's help.    LOS: 5 days   [name] Graham 03/23/2012, 12:49 PM

## 2012-03-24 ENCOUNTER — Ambulatory Visit: Payer: Medicare Other | Admitting: Cardiology

## 2012-03-24 LAB — URINALYSIS, ROUTINE W REFLEX MICROSCOPIC
Bilirubin Urine: NEGATIVE
Ketones, ur: NEGATIVE mg/dL
Protein, ur: NEGATIVE mg/dL
Urobilinogen, UA: 1 mg/dL (ref 0.0–1.0)

## 2012-03-24 LAB — URINE MICROSCOPIC-ADD ON

## 2012-03-24 MED ORDER — PIPERACILLIN-TAZOBACTAM 3.375 G IVPB
3.3750 g | Freq: Three times a day (TID) | INTRAVENOUS | Status: DC
  Administered 2012-03-24 – 2012-03-27 (×8): 3.375 g via INTRAVENOUS
  Filled 2012-03-24 (×10): qty 50

## 2012-03-24 MED ORDER — VANCOMYCIN HCL 1000 MG IV SOLR
1250.0000 mg | Freq: Two times a day (BID) | INTRAVENOUS | Status: DC
  Administered 2012-03-24 – 2012-03-27 (×5): 1250 mg via INTRAVENOUS
  Filled 2012-03-24 (×8): qty 1250

## 2012-03-24 NOTE — Progress Notes (Signed)
Physical Therapy Treatment Patient Details Name: [name] Graham MRN: [id number] DOB: June 11, 1929 Today's Date: 03/24/2012 Time: 4098-1191 PT Time Calculation (min): 32 min  PT Assessment / Plan / Recommendation Comments on Treatment Session  Pt with increase fatigue this session.  Pt continues to have increase HR and HR increased to 160s with ambulation.  Pt return to supine and HR took ~5 minutes to return to 100s.  RN aware.    Follow Up Recommendations  Inpatient Rehab;Supervision/Assistance - 24 hour    Barriers to Discharge        Equipment Recommendations  Defer to next venue    Recommendations for Other Services    Frequency Min 4X/week   Plan Discharge plan remains appropriate;Frequency remains appropriate    Precautions / Restrictions Precautions Precautions: Fall   Pertinent Vitals/Pain No c/o pain    Mobility  Bed Mobility Bed Mobility: Rolling Right;Right Sidelying to Sit Rolling Right: 3: Mod assist Right Sidelying to Sit: 2: Max assist Sitting - Scoot to Edge of Bed: 2: Max assist Details for Bed Mobility Assistance: (A) to initiate rolling and (A) to elevate trunk OOB with max cues to advance hips EOB with facilitation of proper weight shifts to advance hips Transfers Transfers: Sit to Stand;Stand to Sit Sit to Stand: 1: +2 Total assist;From bed Sit to Stand: Patient Percentage: 50% Stand to Sit: 1: +2 Total assist;To chair/3-in-1 Stand to Sit: Patient Percentage: 50% Details for Transfer Assistance: +2 (A) to initiate transfer this session and max cues for hand placement.  Max cues for forward translation. Ambulation/Gait Ambulation/Gait Assistance: 4: Min assist Ambulation Distance (Feet): 100 Feet Assistive device: Rolling walker Ambulation/Gait Assistance Details: (A) for RW management with auditory cues to promote correct step length and gait speed.  Recliner to follow for safety. Gait Pattern: Step-to pattern;Decreased stride length;Shuffle;Trunk  flexed;Decreased trunk rotation    Exercises     PT Diagnosis:    PT Problem List:   PT Treatment Interventions:     PT Goals Acute Rehab PT Goals PT Goal Formulation: With patient Time For Goal Achievement: 04/02/12 Potential to Achieve Goals: Good Pt will go Supine/Side to Sit: with min assist PT Goal: Supine/Side to Sit - Progress: Not met Pt will go Sit to Supine/Side: with min assist PT Goal: Sit to Supine/Side - Progress: Not met Pt will go Sit to Stand: with mod assist PT Goal: Sit to Stand - Progress: Not met Pt will go Stand to Sit: with mod assist PT Goal: Stand to Sit - Progress: Not met Pt will Ambulate: >150 feet;with supervision;with rolling walker PT Goal: Ambulate - Progress: Not met  Visit Information  Last PT Received On: 03/24/12 Assistance Needed: +2    Subjective Data  Subjective: "What are we doing today?"   Cognition  Overall Cognitive Status: Impaired Area of Impairment: Memory Arousal/Alertness: Awake/alert Orientation Level: Situation;Time Behavior During Session: [facility] for tasks performed    Balance     End of Session PT - End of Session Equipment Utilized During Treatment: Gait belt Activity Tolerance: Patient tolerated treatment well Patient left: in chair;with call bell/phone within reach Nurse Communication: Mobility status   GP     Daruis Swaim 03/24/2012, 3:27 PM Jake Shark, PT DPT [phone]

## 2012-03-24 NOTE — Progress Notes (Signed)
IV team paged to change IV site. Site is dated 8/16. Site is free of s/sx of infection. Site flushes without difficulty. 2 RN's assessed. Poor venous access. Pt daughter in law at bedside and states that "he has horrible veins". IV team aware that pt's site is out of date. Plan is for pt to receive IV Vanc and Zosyn short term. IV team aware of that as well.

## 2012-03-24 NOTE — Progress Notes (Addendum)
Internal Medicine Attending  Date: 03/24/2012  Patient name: [name] Graham Medical record number: [id number] Date of birth: October 23, 1928 Age: 78 y.o. Gender: male  I saw and evaluated the patient and discussed his care on a.m. rounds with house staff.  He had a low-grade temperature elevation overnight, with MAXIMUM TEMPERATURE of  100.2, and his appetite is decreased this morning.  Exam is notable for clear lungs; 2/6 systolic murmur as before; soft abdomen with mild suprapubic tenderness; no edema; skin exam shows erythema, warmth, and induration around the site of his previous left buttock abscess incision and drainage, with thick pus expressible.  Plan is general surgery consult for abscess drainage; empiric antibiotics pending culture results of abscess contents; check urinalysis and culture.  Given above problems, he will not be discharged to a skilled nursing facility today as previously anticipated.  His heart rate is better controlled following the increase in his metoprolol XL to his home dose; will continue to monitor heart rate and blood pressure.Marland Kitchen

## 2012-03-24 NOTE — Progress Notes (Signed)
Spoke with RN re: IV RRS needed.  Current IV flushing well per RN and site CDI.  Stated slight potential for PICC possible for IV Vancomycin and pt is to be seen by surgery sometime today.  Notified RN that since IV patent and CDI and possible PICC, IVT would wait and see what surgery recommends before starting a new PIV this shift.

## 2012-03-24 NOTE — Progress Notes (Signed)
Medical Student Daily Progress Note  Subjective: Mr. Goldring says he is feeling about the same today. He had a bowel movement with the assistance of an enema on 8/21. It is noteworthy that he never had an organic BM during his stay here. His abdominal pain has decreased, but he says he can tell there is still stool that is causing some discomfort. He remains incontinent, and endorses some burning with urination. His appetite has decreased, which is unlike him per family. He says that his abscess is feeling better today than it did yesterday. His strength feels the same to him.  Objective: Vital signs in last 24 hours: Filed Vitals:   03/24/12 0250 03/24/12 0534 03/24/12 0550 03/24/12 0611  BP: 118/67 133/103  137/66  Pulse: 99 112  108  Temp: 98.6 F (37 C) 98.8 F (37.1 C)    TempSrc: Oral     Resp: 18 21    Height:      Weight:   120.7 kg (266 lb 1.5 oz) 121.5 kg (267 lb 13.7 oz)  SpO2: 99% 95%     Weight change: -4.1 kg (-9 lb 0.6 oz)  Intake/Output Summary (Last 24 hours) at 03/24/12 1540 Last data filed at 03/24/12 0900  Gross per 24 hour  Intake    240 ml  Output    200 ml  Net     40 ml   Physical Exam: Vitals reviewed. General: resting in bed, NAD HEENT: PERRL, EOMI, no scleral icterus Cardiac: Irregularly irregular heart rhythm, normal rate, 3/6 systolic ejection murmer to carotids Pulm: clear to auscultation bilaterally, no wheezes, rales, or rhonchi Abd: soft, mild suprapubic tenderness, nondistended, hyperactive BS Ext: warm and well perfused, no pedal edema. Some ulceration noted on his right heel. Left gluetal abscess is draining thick pus with surrounding induration and erythema ~5 cm in diameter. Tenderness to palpation, no increased temperature. Neuro: alert and oriented X3, cranial nerves II-XII grossly intact, strength and sensation to light touch equal in bilateral upper extremities. Strength 4/5 in lower extremities, with R being weaker than the L. Sensation  normal in lower extremities. Tremor R>L, masked facies, increased rigidity today.  Lab Results: Coagulation:  Lab 03/23/12 0500 03/22/12 0524 03/21/12 0515 03/20/12 0645  LABPROT 25.0* 24.0* 25.0* 25.3*  INR 2.22* 2.11* 2.22* 2.26*    Medications: I have reviewed the patient's current medications. Scheduled Meds:   . allopurinol  300 mg Oral Daily  . aspirin  81 mg Oral Daily  . carbidopa-levodopa  1 tablet Oral TID  . furosemide  40 mg Oral Daily  . levothyroxine  175 mcg Oral Daily  . metoprolol succinate  50 mg Oral Daily  . omega-3 acid ethyl esters  1 g Oral BID  . pantoprazole  40 mg Oral Q1200  . piperacillin-tazobactam (ZOSYN)  IV  3.375 g Intravenous Q8H  . polyethylene glycol  17 g Oral Daily  . senna-docusate  2 tablet Oral BID  . simvastatin  10 mg Oral q1800  . sodium phosphate  1 enema Rectal Once  . sucralfate  1 g Oral BID  . vancomycin  1,250 mg Intravenous Q12H  . warfarin  2.5 mg Oral Q T,Th,S,Su-1800  . warfarin  5 mg Oral Q M,W,F-1800  . Warfarin - Pharmacist Dosing Inpatient   Does not apply q1800   Continuous Infusions:  PRN Meds:.Glycerin (Adult), LORazepam Assessment/Plan: Principal Problem:  *Weakness of both legs: Deconditioning 2/2 to decreased activity and staying with his wife in the hospital  likely played a role in his weakness along with worsening Parkinsonian symptoms. Improvement in his strength has reached a plateau since 6/19 it seems. - Continue PT/OT - SNF rehabilitation  Active Problems: L. Gluteal abscess: Wound is draining thick pus, induration and erythema have increased from ~3 cm in diameter to ~5-6 cm in diameter. Infectious etiology likely CA-MRSA, but anaerobes cannot be ruled out. Pts temperature went up to 100.4 yesterday, there is concern that the nidus may be this abscess vs. UTI. - f/u wound cx - Start IV Vanc+Zosyn - c/s surgery regarding I&D - nurse has been having difficulty with turning the patient, will be using air  mattress to reduce risk of worsening the abscess. - AM CBC  Constipation: Pts constipation and urinary incontinence is likely 2/2 dysautonomia associated with PD. In addition, sinemet can cause constipation. He is on an aggressive bowel regimen, but has only been able to have a BM if he has an enema. Obstruction unlikely, pt has normal bowel sounds and is passing flatus. He does have a family history of bowel disease - KUB to assess for obstruction - continue current bowel regimen + enemas as needed  Urinary Incontinence: Pt continues to have difficulty controlling his urine. He endorses some dysuria today, which he has not in the past. His temperature went up to 100/4 yesterday, concerning for UTI. -U/A plus cx - AM CBC  R. Heel Ulceration: pts foot has been given a soft boot to prevent progression to a bed sore.  Parkinson's disease: Continue sinemet regimen. Pts constipation and urinary incontinence is likely 2/2 dysautonomia associated with PD. In addition, sinemet can cause constipation. Otherwise, pt is tolerating the medication well. - Continue to monitor for hallucinations   HYPERTENSION, BENIGN: Patient's bps have been well-controlled when he is not exerting himself or in pain (ie constipation yesterday). We are still holding his captopril. - Continue monitoring bp here and at SNF and assess for need of ACEI - AM BMP to assess K status and if supplementation neccesary   Atrial fibrillation: INR is therapeutic at 2.22. HR went as high as 112, was going as high as 130 on 8/21. We restarted the patient's metoprolol on 8/21, and as a result his HR has been more stable. - Continue warfarin and rate control regimen    LOS: 6 days   This is a Psychologist, occupational Note.  The care of the patient was discussed with Dr. Tonny Branch and the assessment and plan formulated with their assistance.  Please see their attached note for official documentation of the daily encounter.  Luretha Rued 03/24/2012, 3:40 PM

## 2012-03-24 NOTE — Progress Notes (Signed)
ANTIBIOTIC CONSULT NOTE - INITIAL  Pharmacy Consult for Vancomycin and Zosyn Indication: left gluteal abscess and cellulitis  Allergies  Allergen Reactions  . Morphine And Related     Goes Crazy  . Nifedipine Other (See Comments)    "makes me go crazy"  . Nitroglycerin     Patient Measurements: Height: 6' 0.05" (183 cm) Weight: 267 lb 13.7 oz (121.5 kg) (re-weight) IBW/kg (Calculated) : 77.71   Vital Signs: Temp: 98.8 F (37.1 C) (08/22 0534) Temp src: Oral (08/22 0250) BP: 137/66 mmHg (08/22 0611) Pulse Rate: 108  (08/22 0611) Intake/Output from previous day: 08/21 0701 - 08/22 0700 In: 480 [P.O.:480] Out: 800 [Urine:800] Intake/Output from this shift: Total I/O In: 240 [P.O.:240] Out: -   Labs: No results found for this basename: WBC:3,HGB:3,PLT:3,LABCREA:3,CREATININE:3 in the last 72 hours Estimated Creatinine Clearance: 76.3 ml/min (by C-G formula based on Cr of 1.04). No results found for this basename: VANCOTROUGH:2,VANCOPEAK:2,VANCORANDOM:2,GENTTROUGH:2,GENTPEAK:2,GENTRANDOM:2,TOBRATROUGH:2,TOBRAPEAK:2,TOBRARND:2,AMIKACINPEAK:2,AMIKACINTROU:2,AMIKACIN:2, in the last 72 hours     Microbiology: No results found for this or any previous visit (from the past 720 hour(s)).  Medical History: Past Medical History  Diagnosis Date  . Pulmonary embolus     bilateral  . Hypothyroid   . Hypertension   . CAD (coronary artery disease)   . Chronic atrial fibrillation     INR therpeutic on Coumadin  . Dementia     hallucinations status post psychiatric evaluation during admission  . Kidney cyst, acquired   . A-fib   . CAD (coronary artery disease)     cath 2006, no stents, ejection fraction  . Leukocytosis   . Tremor   . Parkinson disease     Assessment: 78 yo male to begin vancomycin and Zosyn for left gluteal abscess and cellulitis. Patient had bedside I&D on 8/16 and has been treated with warm compresses. Patient is now febrile and has an elevated HR. WBC  normal on 8/19. Wound culture is pending.   Goal of Therapy:  Vancomycin trough level 15-20 mcg/ml  Plan:  -Vancomycin 1250 mg IV q12h -Zosyn 3.375 g IV q8h to be infused over 4 hours -Follow-up renal function and clinical course   [facility], [address].D., BCPS Clinical Pharmacist Pager: [phone] 03/24/2012 12:55 PM

## 2012-03-24 NOTE — Progress Notes (Addendum)
Received call from Bonnye Fava, [facility]- patient is followed by [facility]- thus- she will accept CSW follow up of patient. Patient has been worked up for SNF placement and family/patient wants [facility].  This facility currently does not have a bed per Ulice Dash- Admissions Director. She will monitor and offer bed IF one comes available.  Family is attempting to place both patient and his wife at [facility].   Per MD- patient is not yet stable for d/c. Spoke wi  CSW will continue to monitor.  I will sign off.  Lorri Frederick. [place]  [phone]

## 2012-03-24 NOTE — Progress Notes (Signed)
Utilization review completed. Kavitha Lansdale, RN, BSN. 

## 2012-03-24 NOTE — Progress Notes (Signed)
Pt up in chair at bedside.  Current PIV site CDI.  Surgery PA at bedside to come back to assess wound when pt back in bed.  Notified RN to also page IVT when pt back in bed for PIV restart.  Spoke with PA, stated doubtful need for PICC for type of wound pt has.  Will check back after returned page.

## 2012-03-24 NOTE — Progress Notes (Signed)
Resident Co-sign Daily Note: I have seen the patient and reviewed the daily progress note by Kathrin Ruddy MS IV and discussed the care of the patient with them.  See below for documentation of my findings, assessment, and plans.  Subjective: Doing better today.  States that he feels less weak in his legs.  He still had not had a BM without the assistance of an enema.  Abdominal pain is resolved.  He does state that he still can't feel when he has to urinate but that it occasionally burns when he does urinate.  Mild fever last night.   Objective: Vital signs in last 24 hours: Filed Vitals:   03/24/12 0550 03/24/12 0611 03/24/12 1700 03/24/12 1740  BP:  137/66 163/87   Pulse:  108 110   Temp:   101.6 F (38.7 C) 99 F (37.2 C)  TempSrc:      Resp:   18   Height:      Weight: 266 lb 1.5 oz (120.7 kg) 267 lb 13.7 oz (121.5 kg)    SpO2:   96%    Physical Exam: Vitals reviewed. General: resting in bed, NAD HEENT: PERRL, EOMI, no scleral icterus Cardiac: Irregularly irregular rate and rhythm, 3/6 systolic murmur with radiation to the carotids.  no rubs, or gallops Pulm: clear to auscultation bilaterally, no wheezes, rales, or rhonchi Abd: soft, mild suprapubic tenderness, nondistended, Hyperactive BS present Ext: warm and well perfused, no pedal edema, left gluteal abscess with surrounding erythema, draining brown/reddish purulent drainage.   Neuro: alert and oriented X3, cranial nerves II-XII grossly intact, strength and sensation to light touch equal in bilateral upper extremities.  Strength is 4/5 in the bilateral lower extremities R>L.  Sensation is intact.  Intention Tremor noted in the bilateral upper extremities with R>L.  Masked facies and rigidity.   Lab Results: Reviewed and documented in Electronic Record Micro Results: Reviewed and documented in Electronic Record Studies/Results: Reviewed and documented in Electronic Record Medications: I have reviewed the patient's current  medications. Scheduled Meds:   . allopurinol  300 mg Oral Daily  . aspirin  81 mg Oral Daily  . carbidopa-levodopa  1 tablet Oral TID  . furosemide  40 mg Oral Daily  . levothyroxine  175 mcg Oral Daily  . metoprolol succinate  50 mg Oral Daily  . omega-3 acid ethyl esters  1 g Oral BID  . pantoprazole  40 mg Oral Q1200  . piperacillin-tazobactam (ZOSYN)  IV  3.375 g Intravenous Q8H  . polyethylene glycol  17 g Oral Daily  . senna-docusate  2 tablet Oral BID  . simvastatin  10 mg Oral q1800  . sucralfate  1 g Oral BID  . vancomycin  1,250 mg Intravenous Q12H  . warfarin  2.5 mg Oral Q T,Th,S,Su-1800  . warfarin  5 mg Oral Q M,W,F-1800  . Warfarin - Pharmacist Dosing Inpatient   Does not apply q1800   Continuous Infusions:  PRN Meds:.Glycerin (Adult), LORazepam  Assessment/Plan: 1. Weakness of both legs: On admission he has bilateral weakness of his legs as well as some mild altered mental status. His daughter is his primary caregiver and she is present today. He has had progressive weakness that has been getting slowly worse over the last few months but really started getting worse in the past week, concomitantly with his UTI. In addition, the "drop-off" in his strength and mental status came after learning that his wife was getting another surgery. Deconditioning 2/2 to decreased activity and  staying with his wife in the hospital is likely playing a role in his weakness along with worsening Parkinsonian symptoms. Weakness continues to improve. He has tolerated the increase of his Sinamet to TID without side effects. PT and OT continued to suggest Inpatient Rehab and rehab had accepted him but family on 8/21 had decided that they would like him to go to a SNF in [place] because his wife would be going there when she is discharged.     - Appreciate neurology recommendations with the medications.  We will work to have them follow up as an outpatient.   - PT/OT recs include inpatient rehab     - Plan to D/C to SNF in [place] when he is ready to discharge.   2. Parkinson's Disease: According to his daughter, the patient had an episode of agitation and violence, went to a health care facility and was placed on an anti-psychotic agent for presumed agitation 2/2 schizophrenia. This has almost certainly worsened his parkinson's.  - Appreciate neuro recs regarding medications, if he has hallucinations the plans are to add Seroquel at very low doses.  -Continue Sinemet  - D/C Inve[facility]   3. Urinary Incontinence/dysuria: overflow incontinence 2/2 BPH vs a inability to get to a bathroom before urination vs. Relation to his parkinson's progression.  He has had some problems with dysuria over the last day so we will check UA and treat accordingly to rule out infection with the dysuria and mild fever.   -continue Condom cath in place to protect skin.  - UA and follow for ABX  4. Gluteal abscess: mild fever overnight and the abscess is draining more pus today with some surrounding erythema.  We will have Surgery come by and I&D it at the bedside.  With the possibility of resistant organisms we will cover him broadly with Vanc and Zosyn for now which will cover anything in his urine as well.   5. AORTIC STENOSIS/ INSUFFICIENCY: Per Dr. Rosalyn Charters records, Mr. Redner is a poor candidate for TAVR. His metoprolol dose was decreased due to hypotension and has been controlling his rate as well as his blood pressure. He may need increased dose as he gets to be more active again.   - Continue metoprolol and Lasix.   6. Atrial fibrillation: Pt has been in atrial fibrillation during this hospitalization. INR is therapeutic. HR 110's today, we resumed his metoprolol yesterday, may need to increase dosage tomorrow   - Continue warfarin regimen   7. HYPERTENSION, BENIGN: stable BP 132/72   -Continue metoprolol at 50 mg PO QDay. HR is   slightly up today to 110's. Metoprolol was just   resumed yesterday   8.  FEN/GI: No BM today. Will continue senna with enema PRN. Will also give a glycerin suppository PRN as well.  - Continue current bowel regimen  -Will give GI cocktail for abdominal discomfort- this could be due to reflux  -Continue PPI   9. Dispo: Plan for D/C to the SNF in [place] when he is ready.  He will need to be afebrile for at least 24 hours and would be nice to be able to narrow the antibiotics based on the culture.    LOS: 6 days   Cecia Egge 03/24/2012, 10:17 PM

## 2012-03-24 NOTE — Progress Notes (Signed)
MD and MD team in to see pt. Dressing of L buttock s/p I and D site of abscess. Wound bed is yellow. Area is oval in shape. Area draining mod amount of purulent bldy drainage. No odor noted. Wound culture obtained per order. Rept to MD regarding pt repts that he "can't hold his urine" and that urine has a foul odor. Per order, will obtain urine for UA and C and S via clean new condom cath and bag. Pt also noted to have a white blanchable area of L heel. No pressure areas noted of heels or sacral area. Ordered boot for pressure relief. Pt refuses to turn. Will obtain order for pt an air mattress overlay and obtain mattress.

## 2012-03-25 DIAGNOSIS — K612 Anorectal abscess: Secondary | ICD-10-CM

## 2012-03-25 DIAGNOSIS — N390 Urinary tract infection, site not specified: Secondary | ICD-10-CM

## 2012-03-25 LAB — BASIC METABOLIC PANEL
CO2: 25 mEq/L (ref 19–32)
Calcium: 8.8 mg/dL (ref 8.4–10.5)
Creatinine, Ser: 0.99 mg/dL (ref 0.50–1.35)

## 2012-03-25 LAB — PROTIME-INR: Prothrombin Time: 34.3 seconds — ABNORMAL HIGH (ref 11.6–15.2)

## 2012-03-25 LAB — CBC
MCH: 32.8 pg (ref 26.0–34.0)
MCV: 97.9 fL (ref 78.0–100.0)
Platelets: 199 10*3/uL (ref 150–400)
RBC: 3.84 MIL/uL — ABNORMAL LOW (ref 4.22–5.81)

## 2012-03-25 MED ORDER — LIDOCAINE-EPINEPHRINE 1 %-1:100000 IJ SOLN
10.0000 mL | INTRAMUSCULAR | Status: AC
  Filled 2012-03-25: qty 10

## 2012-03-25 MED ORDER — ACETAMINOPHEN 10 MG/ML IV SOLN
1000.0000 mg | Freq: Four times a day (QID) | INTRAVENOUS | Status: AC
  Administered 2012-03-25 – 2012-03-26 (×4): 1000 mg via INTRAVENOUS
  Filled 2012-03-25 (×4): qty 100

## 2012-03-25 NOTE — Consult Note (Signed)
Continue IV ABX.  Area now well drained. Patient examined and I agree with the assessment and plan  Violeta Gelinas, MD, MPH, FACS Pager: [phone]  03/25/2012 3:56 PM

## 2012-03-25 NOTE — Progress Notes (Signed)
Agree with OT cancellation note.  [place], [place] DPT [phone]

## 2012-03-25 NOTE — Progress Notes (Addendum)
PT Cancellation Note  Treatment cancelled today due to patient's refusal to participate; states that he really just can't today.  [name] Graham 03/25/2012, 3:19 PM Charlotte Crumb, PT DPT  [phone]

## 2012-03-25 NOTE — Progress Notes (Signed)
PT/OT Cancellation Note  Treatment cancelled today due to pt just completed bedside debridement with MD. RN requesting PT/OT return later today.  03/25/2012 Cipriano Mile OTR/L Pager [phone] Office [phone]

## 2012-03-25 NOTE — Progress Notes (Signed)
Internal Medicine Attending  Date: 03/25/2012  Patient name: [name] Graham Medical record number: [id number] Date of birth: 1929/06/28 Age: 78 y.o. Gender: male  I saw and evaluated the patient on a.m. rounds with house staff.  He has undergone incision and drainage of the abscess on his buttock, and reports improvement in the pain.  He has no new complaints.  Exam shows clear lungs; regular rhythm, 3/6 systolic murmur as before; mild suprapubic tenderness.  Labs are notable for creatinine 0.99, with WBC 8.1, hemoglobin 12.6, platelet 199, INR 3.33.  Plans include wound care for abscess site; empiric vancomycin and Zosyn pending results of wound culture and urine culture; continue management of Parkinson's disease as per neurology; warfarin management as per pharmacy.  Patient has urinary incontinence without evidence of significant retention on PVR; urologic evaluation would be helpful.  I discussed with his daughter the pros and cons of a chronic Foley catheter; for now, we are managing this with a condom catheter.

## 2012-03-25 NOTE — Consult Note (Signed)
Reason for Consult: Gluteal abscess  Referring Physician: Dr. Marca Ancona is an 78 y.o. male.  HPI: 78 yr old male admitted to [facility] with bilateral lower extremity weakness who has a left buttocks abscess.  He reports having pain there for about 2 weeks.  In the ER this area was incised but it continues to drain copious amounts of purulent drainage and remains very tender with a large area of induration.  He is currently on vanc and zosyn.  He also has a UTI.  He has been febrile.  Past Medical History  Diagnosis Date  . Pulmonary embolus     bilateral  . Hypothyroid   . Hypertension   . CAD (coronary artery disease)   . Chronic atrial fibrillation     INR therpeutic on Coumadin  . Dementia     hallucinations status post psychiatric evaluation during admission  . Kidney cyst, acquired   . A-fib   . CAD (coronary artery disease)     cath 2006, no stents, ejection fraction  . Leukocytosis   . Tremor   . Parkinson disease     Past Surgical History  Procedure Date  . Mole removakl     Family History  Problem Relation Age of Onset  . Heart failure Mother   . Microcephaly Father     Social History:  reports that he has quit smoking. His smoking use included Cigarettes. He has a 24 pack-year smoking history. He does not have any smokeless tobacco history on file. He reports that he does not drink alcohol or use illicit drugs.  Allergies:  Allergies  Allergen Reactions  . Morphine And Related     Goes Crazy  . Nifedipine Other (See Comments)    "makes me go crazy"  . Nitroglycerin     Medications: I have reviewed the patient's current medications.  Results for orders placed during the hospital encounter of 03/18/12 (from the past 48 hour(s))  WOUND CULTURE     Status: Normal (Preliminary result)   Collection Time   03/24/12 12:54 PM      Component Value Range Comment   Specimen Description WOUND LEFT BUTTOCKS      Special Requests NONE      Gram Stain PENDING       Culture Culture reincubated for better growth      Report Status PENDING     URINALYSIS, ROUTINE W REFLEX MICROSCOPIC     Status: Abnormal   Collection Time   03/24/12  2:48 PM      Component Value Range Comment   Color, Urine YELLOW  YELLOW    APPearance CLOUDY (*) CLEAR    Specific Gravity, Urine 1.016  1.005 - 1.030    pH 5.5  5.0 - 8.0    Glucose, UA NEGATIVE  NEGATIVE mg/dL    Hgb urine dipstick LARGE (*) NEGATIVE    Bilirubin Urine NEGATIVE  NEGATIVE    Ketones, ur NEGATIVE  NEGATIVE mg/dL    Protein, ur NEGATIVE  NEGATIVE mg/dL    Urobilinogen, UA 1.0  0.0 - 1.0 mg/dL    Nitrite POSITIVE (*) NEGATIVE    Leukocytes, UA LARGE (*) NEGATIVE   URINE MICROSCOPIC-ADD ON     Status: Abnormal   Collection Time   03/24/12  2:48 PM      Component Value Range Comment   WBC, UA TOO NUMEROUS TO COUNT  <3 WBC/hpf    RBC / HPF 0-2  <3 RBC/hpf  Bacteria, UA MANY (*) RARE   PROTIME-INR     Status: Abnormal   Collection Time   03/25/12  5:50 AM      Component Value Range Comment   Prothrombin Time 34.3 (*) 11.6 - 15.2 seconds    INR 3.33 (*) 0.00 - 1.49     No results found.  Review of Systems  Constitutional: Positive for fever.  HENT: Negative.   Eyes: Negative.   Respiratory: Negative.   Cardiovascular: Negative.   Gastrointestinal: Positive for abdominal pain. Negative for nausea and vomiting.  Genitourinary: Negative.   Musculoskeletal: Negative.   Skin:       Abscess on left buttocks  Neurological: Negative.   Endo/Heme/Allergies: Negative.   Psychiatric/Behavioral: Negative.    Blood pressure 127/70, pulse 84, temperature 99.7 F (37.6 C), temperature source Oral, resp. rate 16, height 6' 0.05" (1.83 m), weight 273 lb 9.5 oz (124.1 kg), SpO2 94.00%. Physical Exam  Constitutional: No distress.       Chronically ill appearing  HENT:  Head: Normocephalic and atraumatic.  Eyes: Pupils are equal, round, and reactive to light.       Right eye swollen  Neck: Normal  range of motion. Neck supple.  Cardiovascular: Gallop: Irregular.   Murmur heard.      Irregular   Respiratory: Effort normal and breath sounds normal.  GI: Soft. Bowel sounds are normal. There is no tenderness.  Genitourinary:       Perirectal abscess on the left that is draining but not fully open, large area of induration, very tender  Musculoskeletal: He exhibits edema (lower extremities).  Neurological: He is alert.  Skin: Skin is warm and dry.  Psychiatric: He has a normal mood and affect. His behavior is normal.    Assessment/Plan: 1.  Perirectal abscess: the area was incised and drained as indicated below.  Wound cultures are pending.  We will remove the packing tomorrow and make a decision for dressing changes vs sitz bathes.  Continue empiric antibiotics until cultures return although likely choices for this type of wound will be Augmentin or doxycycline.  Thank you for this consult.  We will follow.  Procedure Note: The left buttocks was prepped with betadine 3 times.  10cc of 1% Lidocaine with epi was injected into the area and once adequate anesthesia was reached the area was incised and a larger wound was made to allow for better drainage.  A large amount of purulence was expressed and a large area was explored digitally under the induration to insure adequate drainage of the abscess.  The area was then packed with dry gauze for 2 minutes to reach hemostasis.  The dry gauze was removed and iodoform packing was then packed into the wound and a dry dressing was placed over the wound.  The patient tolerated the procedure well.    Arlynn Stare 03/25/2012, 9:50 AM

## 2012-03-25 NOTE — Progress Notes (Signed)
Medical Student Daily Progress Note  Subjective: [name] Graham says he is doing well today. Appetite is still not at his baseline. Stomach pain has resolved with a BM this AM. His U/A showed positive leuk esterase and nitrite. He is still incontinent. Surgery performed another I&D this morning with packing. He is getting acetaminophen IV for pain.  He was started on Vanc/Zosyn to cover MRSA/anaerobes for his wound infection along with gram negatives for his UTI. Objective: Vital signs in last 24 hours: Filed Vitals:   03/24/12 1740 03/24/12 2200 03/25/12 0500 03/25/12 0657  BP:  117/78  127/70  Pulse:  96  84  Temp: 99 F (37.2 C) 98.9 F (37.2 C)  99.7 F (37.6 C)  TempSrc:  Oral  Oral  Resp:  18  16  Height:      Weight:   124.1 kg (273 lb 9.5 oz)   SpO2:  96%  94%   Weight change: 3.4 kg (7 lb 7.9 oz)  Intake/Output Summary (Last 24 hours) at 03/25/12 1218 Last data filed at 03/25/12 0657  Gross per 24 hour  Intake    660 ml  Output    750 ml  Net    -90 ml   Physical Exam: Vitals reviewed.  General: resting in bed, NAD  HEENT: PERRL, EOMI, no scleral icterus  Cardiac: Irregularly irregular heart rhythm, normal rate, 3/6 systolic ejection murmer to carotids  Pulm: clear to auscultation bilaterally, no wheezes, rales, or rhonchi  Abd: soft, mild suprapubic tenderness, nondistended, hyperactive BS  Ext: warm and well perfused, no pedal edema. Some ulceration noted on his right heel. Left gluetal abscess is draining thick pus with surrounding induration and erythema ~5 cm in diameter. Tenderness to palpation, no increased temperature.  Neuro: alert and oriented X3, cranial nerves II-XII grossly intact, improved facial expression, strength and sensation to light touch equal in bilateral upper extremities. Strength 4/5 in lower extremities, with R being weaker than the L. Sensation normal in lower extremities. Tremor R>L, masked facies, increased rigidity today.  Lab Results: Basic  Metabolic Panel:  Lab 03/19/12 4540  NA 138  K 3.7  CL 102  CO2 27  GLUCOSE 99  BUN 22  CREATININE 1.04  CALCIUM 8.8  MG --  PHOS --   CBC:  Lab 03/25/12 1135 03/21/12 0515  WBC 8.1 7.3  NEUTROABS -- --  HGB 12.6* 12.0*  HCT 37.6* 35.4*  MCV 97.9 98.1  PLT 199 181    Coagulation:  Lab 03/25/12 0550 03/23/12 0500 03/22/12 0524 03/21/12 0515  LABPROT 34.3* 25.0* 24.0* 25.0*  INR 3.33* 2.22* 2.11* 2.22*   Urinalysis:  Lab 03/24/12 1448  COLORURINE YELLOW  LABSPEC 1.016  PHURINE 5.5  GLUCOSEU NEGATIVE  HGBUR LARGE*  BILIRUBINUR NEGATIVE  KETONESUR NEGATIVE  PROTEINUR NEGATIVE  UROBILINOGEN 1.0  NITRITE POSITIVE*  LEUKOCYTESUR LARGE*    Micro Results: Recent Results (from the past 240 hour(s))  WOUND CULTURE     Status: Normal (Preliminary result)   Collection Time   03/24/12 12:54 PM      Component Value Range Status Comment   Specimen Description WOUND LEFT BUTTOCKS   Final    Special Requests NONE   Final    Gram Stain     Final    Value: ABUNDANT WBC PRESENT,BOTH PMN AND MONONUCLEAR     NO SQUAMOUS EPITHELIAL CELLS SEEN     FEW GRAM POSITIVE COCCI     IN PAIRS IN CLUSTERS   Culture Culture  reincubated for better growth   Final    Report Status PENDING   Incomplete    Medications: I have reviewed the patient's current medications. Scheduled Meds:   . acetaminophen  1,000 mg Intravenous Q6H  . allopurinol  300 mg Oral Daily  . aspirin  81 mg Oral Daily  . carbidopa-levodopa  1 tablet Oral TID  . furosemide  40 mg Oral Daily  . levothyroxine  175 mcg Oral Daily  . lidocaine-EPINEPHrine  10 mL Intradermal STAT  . metoprolol succinate  50 mg Oral Daily  . omega-3 acid ethyl esters  1 g Oral BID  . pantoprazole  40 mg Oral Q1200  . piperacillin-tazobactam (ZOSYN)  IV  3.375 g Intravenous Q8H  . polyethylene glycol  17 g Oral Daily  . senna-docusate  2 tablet Oral BID  . simvastatin  10 mg Oral q1800  . sucralfate  1 g Oral BID  . vancomycin   1,250 mg Intravenous Q12H  . Warfarin - Pharmacist Dosing Inpatient   Does not apply q1800  . DISCONTD: warfarin  2.5 mg Oral Q T,Th,S,Su-1800  . DISCONTD: warfarin  5 mg Oral Q M,W,F-1800   Continuous Infusions:  PRN Meds:.Glycerin (Adult), LORazepam Assessment/Plan: 1. Weakness of both legs: On admission he had bilateral weakness of his legs as well as some mild altered mental status. Weakness continues to improve. He has tolerated the increase of his Sinamet to TID without side effects. PT and OT continued to suggest Inpatient Rehab and rehab had accepted him but family on 8/21 had decided that they would like him to go to a SNF in [place] because his wife would be going there when she is discharged.  - Appreciate neurology recommendations with the medications. Follow up as an outpatient.  - PT/OT recs include inpatient rehab  - Plan to D/C to SNF in [place] when he is ready to discharge.   2. Parkinson's Disease: Facial expressions and rigidity are significantly improved today. Daughter agrees that Mr. Jankowski is looking much better. Recently increased his Sinemet to TID, he is tolerating this well. No hallucinations. - Appreciate neuro recs regarding medications, if he has hallucinations the plans are to add Seroquel at very low doses.  -Continue Sinemet  - Per daughter, pt was taking Ativan 1 mg QHS for sleep at home. Here at the hospital he has not been taking his meds and as a result he has not slept well at all. She is very concerned about this, consider making 0.5 mg Ativan QHS a standing order.  3. UTI: Problems with dysuria on 8/22, UA positive for nitrites and leuk esterase, c/w E. Coli, cx pending. - continue Condom cath in place to protect skin.  - Continue current antibiotic regimen, await cx results  4. Gluteal abscess: Surgery performed I&D it at the bedside. Gram stain grew GPCs, concerning for MRSA - Continue current antibiotic regimen and adjust according to culture  sensitivities.  5. AORTIC STENOSIS/ INSUFFICIENCY: Per Dr. Rosalyn Charters records, Mr. Hoare is a poor candidate for TAVR. His metoprolol dose was decreased due to hypotension and has been controlling his rate as well as his blood pressure. He may need increased dose as he gets to be more active again.  - Continue metoprolol and Lasix.   6. Atrial fibrillation: Pt has been in atrial fibrillation during this hospitalization. INR is elevated at 3.33, dosing per pharmacy. HR 100's today, we resumed his metoprolol on 8/21, may need to increase dosage tomorrow  - Continue  warfarin regimen dosing per pharmacy  7. HYPERTENSION, BENIGN: stable BP 132/72  -Continue metoprolol at 50 mg PO QDay. HR is slightly up today to 100's. Metoprolol was resumed on 8/21 - BMP to assess K status today.  8. FEN/GI: BM with suppository today, first without an enema for this hospitalization. Patient says his abdominal pain is much better than the past few days. Will continue senna with enema PRN. Will also give a glycerin suppository PRN as well.  - Continue current bowel regimen  -Will give GI cocktail for abdominal discomfort- this could be due to reflux  -Continue PPI   9. Dispo: Plan for D/C to the SNF in [place] when he is ready. He will need to be afebrile for at least 24 hours. Will attempt to narrow antimicrobial coverage once cultures return.   LOS: 7 days   This is a Psychologist, occupational Note.  The care of the patient was discussed with Dr. Tonny Branch and the assessment and plan formulated with their assistance.  Please see their attached note for official documentation of the daily encounter.  Luretha Rued 03/25/2012, 12:18 PM

## 2012-03-25 NOTE — Progress Notes (Addendum)
Coumadin per Rx as of 8/20 (had been per MD)  Admit Complaint: 78 yo M presented to ED 03/18/2012 with leg weakness for 1-2 days.  Anticoag: warf per Rx for hx afib, bil PE. INR jumped to 3.33 -Home dose 2.5mg  TTSS and 5mg  on MWF (Anh verified with daughter and patient).   ID: L gluteus infxn/abscess s/p I&D at bedside in ED 8/16, WBC 7.3 on 8/19. Tm 101.6. Recent outpt Cipro course for UTI. Day 1 of vanc and zosyn Vanc 8/22 >> Zosyn 8/22 >>  8/22 Wound Cx - pending 8/22 ucx >> pending   Plan: - d/c standing order of coumadin, no coumadin tonight, and INR in am --Vancomycin 1250 mg IV q12h -Zosyn 3.375 g IV q8h to be infused over 4 hours -Follow-up renal function and plan on antibiotics before checking vanco trough.

## 2012-03-26 LAB — BASIC METABOLIC PANEL
BUN: 20 mg/dL (ref 6–23)
CO2: 25 mEq/L (ref 19–32)
Chloride: 102 mEq/L (ref 96–112)
Creatinine, Ser: 0.99 mg/dL (ref 0.50–1.35)
GFR calc Af Amer: 87 mL/min — ABNORMAL LOW (ref >90)

## 2012-03-26 LAB — PROTIME-INR: Prothrombin Time: 35.7 seconds — ABNORMAL HIGH (ref 11.6–15.2)

## 2012-03-26 MED ORDER — POTASSIUM CHLORIDE CRYS ER 20 MEQ PO TBCR
40.0000 meq | EXTENDED_RELEASE_TABLET | Freq: Once | ORAL | Status: AC
  Administered 2012-03-26: 40 meq via ORAL
  Filled 2012-03-26: qty 1

## 2012-03-26 NOTE — Progress Notes (Signed)
Physical Therapy Treatment Patient Details Name: [name] Graham MRN: [id number] DOB: [birth year] Today's Date: 03/26/2012 Time: 7829-5621 PT Time Calculation (min): 39 min  PT Assessment / Plan / Recommendation Comments on Treatment Session  Pt continues with increase HR in 160's with activity. RN aware.    Follow Up Recommendations  Inpatient Rehab;Supervision/Assistance - 24 hour    Barriers to Discharge        Equipment Recommendations  Defer to next venue    Recommendations for Other Services Rehab consult  Frequency Min 4X/week   Plan Discharge plan remains appropriate;Frequency remains appropriate    Precautions / Restrictions Precautions Precautions: Fall Restrictions Weight Bearing Restrictions: No   Pertinent Vitals/Pain No c/o pain.    Mobility  Bed Mobility Bed Mobility: Right Sidelying to Sit;Sit to Supine Right Sidelying to Sit: 2: Max assist;With rails;HOB elevated Sitting - Scoot to Edge of Bed: 2: Max assist Sit to Supine: 2: Max assist;HOB flat Details for Bed Mobility Assistance: Pt needs assist at trunk for side to sit and assist at trunk and LE's for sit to supine.  Assist needed to scoot to EOB with pad and to weigh shift. Transfers Transfers: Sit to Stand;Stand to Sit Sit to Stand: 1: +2 Total assist;With upper extremity assist;From bed;From chair/3-in-1 Sit to Stand: Patient Percentage: 60% Stand to Sit: 1: +2 Total assist;With upper extremity assist;To bed;To chair/3-in-1 Stand to Sit: Patient Percentage: 70% Details for Transfer Assistance: Pt needs increased assist to stand from recliner (vs bed).  Cues for technique/hand placement.  Needs physical assist to transfer weight over BOS. Ambulation/Gait Ambulation/Gait Assistance: 1: +2 Total assist Ambulation/Gait: Patient Percentage: 70% Ambulation Distance (Feet): 140 Feet Assistive device: Rolling walker Ambulation/Gait Assistance Details: +2 assist for safety and to follow with chair.  Pt  with HR into 160's per telemetry monitor.  Less cues required today.  Some tremors present in UE's but gait with improved control today. Gait Pattern: Step-to pattern;Decreased stride length;Shuffle;Trunk flexed;Decreased trunk rotation Gait velocity: decreased Stairs: No    Exercises     PT Diagnosis:    PT Problem List:   PT Treatment Interventions:     PT Goals Acute Rehab PT Goals PT Goal: Supine/Side to Sit - Progress: Progressing toward goal PT Goal: Sit to Supine/Side - Progress: Progressing toward goal PT Goal: Sit to Stand - Progress: Progressing toward goal PT Goal: Stand to Sit - Progress: Progressing toward goal PT Goal: Ambulate - Progress: Progressing toward goal  Visit Information  Last PT Received On: 03/26/12 Assistance Needed: +2    Subjective Data  Subjective: "I'm ready to try."   Cognition  Overall Cognitive Status: Appears within functional limits for tasks assessed/performed Arousal/Alertness: Awake/alert Orientation Level: Appears intact for tasks assessed Behavior During Session: [facility] for tasks performed    Balance     End of Session PT - End of Session Equipment Utilized During Treatment: Gait belt Activity Tolerance: Patient tolerated treatment well Patient left: in bed;with call bell/phone within reach Nurse Communication: Mobility status   GP     Newell Coral 03/26/2012, 1:36 PM  Newell Coral, PTA [phone]

## 2012-03-26 NOTE — Progress Notes (Signed)
ANTICOAGULATION CONSULT NOTE - Follow Up Consult  Pharmacy Consult for warfarin Indication: atrial fibrillation  Allergies  Allergen Reactions  . Morphine And Related     Goes Crazy  . Nifedipine Other (See Comments)    "makes me go crazy"  . Nitroglycerin     Patient Measurements: Height: 6\' 2"  (188 cm) Weight: 279 lb (126.554 kg) IBW/kg (Calculated) : 82.2   Vital Signs: Temp: 98.3 F (36.8 C) (08/24 0511) BP: 119/73 mmHg (08/24 0511) Pulse Rate: 109  (08/24 0511)  Labs:  Basename 03/26/12 0610 03/25/12 1135 03/25/12 0550  HGB -- 12.6* --  HCT -- 37.6* --  PLT -- 199 --  APTT -- -- --  LABPROT 35.7* -- 34.3*  INR 3.51* -- 3.33*  HEPARINUNFRC -- -- --  CREATININE -- 0.99 --  CKTOTAL -- -- --  CKMB -- -- --  TROPONINI -- -- --    Estimated Creatinine Clearance: 84.2 ml/min (by C-G formula based on Cr of 0.99).   Medications:  Prescriptions prior to admission  Medication Sig Dispense Refill  . acetaminophen (ARTHRITIS PAIN RELIEF) 650 MG CR tablet Take 650 mg by mouth. Take 2 tabs AM and 1 tab PM       . allopurinol (ZYLOPRIM) 300 MG tablet Take 300 mg by mouth daily.        Marland Kitchen aspirin 81 MG chewable tablet Chew 81 mg by mouth daily.      . captopril (CAPOTEN) 25 MG tablet Take 25 mg by mouth 2 (two) times daily. As directed        . Ciprofloxacin (CIPRO PO) Take by mouth 2 times daily at 12 noon and 4 pm.       . Cyanocobalamin (VITAMIN B-12 IJ) Inject as directed every 30 (thirty) days.      . furosemide (LASIX) 80 MG tablet Take 80 mg by mouth daily.        Marland Kitchen levothyroxine (SYNTHROID) 175 MCG tablet Take 175 mcg by mouth daily.        Marland Kitchen LORazepam (ATIVAN) 1 MG tablet Take 1 mg by mouth as needed. For anxiety      . metoprolol (TOPROL XL) 50 MG 24 hr tablet Take 50 mg by mouth daily.        . Misc Natural Products (OSTEO BI-FLEX ADV DOUBLE ST PO) Take by mouth. Take 2 tabs daily       . Omega-3 Fatty Acids (FISH OIL) 1000 MG CAPS Take by mouth. Take 1  capsule by mouth two times a day       . Omeprazole 20 MG TBEC Take by mouth. 1 tablet once a day       . pravastatin (PRAVACHOL) 10 MG tablet Take 10 mg by mouth daily. Take 2 tabs daily       . sucralfate (CARAFATE) 1 G tablet Take 1 g by mouth 2 (two) times daily.        Marland Kitchen warfarin (COUMADIN) 2.5 MG tablet Take 2.5-5 mg by mouth daily. Takes 2.5mg  (1 tablet) daily except 5mg  (2 tablets) on Monday, Wednesday, and Friday      . paliperidone (INVEGA) 3 MG 24 hr tablet Take 3 mg by mouth daily.          Assessment: 78 y/o male with afib on Coumadin PTA  2.5 mg TTSS and 5 mg MWF - verified with pt and daughter.  INR jumped to supratherapeutic level yesterday and continued to rise to 3.51 today.  No bleeding noted.  Goal of Therapy:  INR 2-3 Monitor platelets by anticoagulation protocol: Yes   Plan:  Hold warfarin tonight. INR in am.     Doris Cheadle, PharmD Clinical Pharmacist Pager: [phone] Phone: [phone] 03/26/2012 10:27 AM

## 2012-03-26 NOTE — Progress Notes (Signed)
Subjective: [name] Graham states he is doing well today. He has some mild pain in his abdomen and states that he hasn't had a BM today.  He had one yesterday with the suppository.  He denies pain in his backside, chest, or legs.  He was able to get up and walk down the hall with PT's help today which he states felt good.  He had an episode of tachycardia when he was up with rates in the 160s per Tele.  This resolved when he went back to bed.  He denies lightheadedness on standing or shortness of breath.    Objective: Vital signs in last 24 hours: Filed Vitals:   03/25/12 1400 03/25/12 2323 03/26/12 0511 03/26/12 1329  BP: 97/52 145/95 119/73 127/72  Pulse: 93 100 109 100  Temp: 98.6 F (37 C) 98.9 F (37.2 C) 98.3 F (36.8 C) 97.9 F (36.6 C)  TempSrc:    Oral  Resp: 20 18 18 18   Height: 6\' 2"  (1.88 m)     Weight: 279 lb (126.554 kg)     SpO2: 93% 95% 93% 96%   Weight change: 5 lb 6.6 oz (2.454 kg)  Intake/Output Summary (Last 24 hours) at 03/26/12 1558 Last data filed at 03/26/12 0514  Gross per 24 hour  Intake    360 ml  Output    750 ml  Net   -390 ml   Vitals reviewed. General: resting in bed, NAD HEENT: PERRL, EOMI, no scleral icterus Cardiac: irregularly irregular rate and rhythm, 3/6 systolic ejection murmur best heard in the LUSB with radiation to the carotids.  no rubs, or gallops Pulm: mild inspiratory crackles in the bases bilaterally.  no wheezes, rales, or rhonchi Abd: soft, mild LLQ tenderness, nondistended, BS present Ext: warm and well perfused, no pedal edema, bilateral boots present.  Packing present in left gluteal wound with mild bloody drainage noted today, minimal erythema surrounding  Neuro: masked facies, alert and oriented X3, cranial nerves II-XII grossly intact, strength is 5/5 in the bilateral upper and lower extremities.  Sensation to light touch equal in bilateral upper and lower extremities  Lab Results: Basic Metabolic Panel:  Lab 03/26/12 1610  03/25/12 1135  NA 136 138  K 3.4* 3.9  CL 102 101  CO2 25 25  GLUCOSE 114* 90  BUN 20 22  CREATININE 0.99 0.99  CALCIUM 8.7 8.8  MG 1.9 --  PHOS 3.1 --   CBC:  Lab 03/25/12 1135 03/21/12 0515  WBC 8.1 7.3  NEUTROABS -- --  HGB 12.6* 12.0*  HCT 37.6* 35.4*  MCV 97.9 98.1  PLT 199 181   Coagulation:  Lab 03/26/12 0610 03/25/12 0550 03/23/12 0500 03/22/12 0524  LABPROT 35.7* 34.3* 25.0* 24.0*  INR 3.51* 3.33* 2.22* 2.11*   Urinalysis:  Lab 03/24/12 1448  COLORURINE YELLOW  LABSPEC 1.016  PHURINE 5.5  GLUCOSEU NEGATIVE  HGBUR LARGE*  BILIRUBINUR NEGATIVE  KETONESUR NEGATIVE  PROTEINUR NEGATIVE  UROBILINOGEN 1.0  NITRITE POSITIVE*  LEUKOCYTESUR LARGE*   Micro Results: Recent Results (from the past 240 hour(s))  WOUND CULTURE     Status: Normal (Preliminary result)   Collection Time   03/24/12 12:54 PM      Component Value Range Status Comment   Specimen Description WOUND LEFT BUTTOCKS   Final    Special Requests NONE   Final    Gram Stain     Final    Value: ABUNDANT WBC PRESENT,BOTH PMN AND MONONUCLEAR     NO  SQUAMOUS EPITHELIAL CELLS SEEN     FEW GRAM POSITIVE COCCI     IN PAIRS IN CLUSTERS   Culture     Final    Value: MODERATE STAPHYLOCOCCUS AUREUS     Note: RIFAMPIN AND GENTAMICIN SHOULD NOT BE USED AS SINGLE DRUGS FOR TREATMENT OF STAPH INFECTIONS.   Report Status PENDING   Incomplete   URINE CULTURE     Status: Normal (Preliminary result)   Collection Time   03/24/12  2:48 PM      Component Value Range Status Comment   Specimen Description URINE, CLEAN CATCH   Final    Special Requests NONE   Final    Culture  Setup Time 03/25/2012 02:29   Final    Colony Count >=100,000 COLONIES/ML   Final    Culture ESCHERICHIA COLI   Final    Report Status PENDING   Incomplete    Studies/Results: No results found. Medications: I have reviewed the patient's current medications. Scheduled Meds:   . acetaminophen  1,000 mg Intravenous Q6H  . allopurinol   300 mg Oral Daily  . aspirin  81 mg Oral Daily  . carbidopa-levodopa  1 tablet Oral TID  . furosemide  40 mg Oral Daily  . levothyroxine  175 mcg Oral Daily  . lidocaine-EPINEPHrine  10 mL Intradermal STAT  . metoprolol succinate  50 mg Oral Daily  . omega-3 acid ethyl esters  1 g Oral BID  . pantoprazole  40 mg Oral Q1200  . piperacillin-tazobactam (ZOSYN)  IV  3.375 g Intravenous Q8H  . polyethylene glycol  17 g Oral Daily  . potassium chloride  40 mEq Oral Once  . senna-docusate  2 tablet Oral BID  . simvastatin  10 mg Oral q1800  . sucralfate  1 g Oral BID  . vancomycin  1,250 mg Intravenous Q12H  . Warfarin - Pharmacist Dosing Inpatient   Does not apply q1800   Continuous Infusions:  PRN Meds:.Glycerin (Adult), LORazepam  Assessment/Plan: 1. Weakness of both legs: On admission he has bilateral weakness of his legs as well as some mild altered mental status. His daughter is his primary caregiver and she is present today. He has had progressive weakness that has been getting slowly worse over the last few months but really started getting worse in the past week, concomitantly with his UTI. In addition, the "drop-off" in his strength and mental status came after learning that his wife was getting another surgery. Deconditioning 2/2 to decreased activity and staying with his wife in the hospital is likely playing a role in his weakness along with worsening Parkinsonian symptoms. Weakness continues to improve. He has tolerated the increase of his Sinamet to TID without side effects. PT and OT continued to suggest Inpatient Rehab and rehab had accepted him but family on 8/21 had decided that they would like him to go to a SNF in [place] because his wife would be going there when she is discharged. He continues to improve daily and is working with PT.   - Appreciate neurology recommendations with the medications. We will work to have them follow up  as an outpatient.   - PT/OT recs include  inpatient rehab   - Plan to D/C to SNF in [place] when he is ready to discharge.   2. Parkinson's Disease: According to his daughter, the patient had an episode of agitation and violence, went to a health care facility and was placed on an anti-psychotic agent for presumed agitation 2/2  schizophrenia. This has almost certainly worsened his parkinson's.  He has not had any hallucinations since starting on the Sinamet.   - Appreciate neuro recs regarding medications, if he has hallucinations the plans are to add Seroquel  at very low doses.   -Continue Sinemet   - D/C Invega   3. Urinary Incontinence/dysuria: overflow incontinence 2/2 BPH vs a inability to get to a bathroom before urination vs. Relation to his parkinson's progression. He has had some problems with dysuria and UA showed a definite UTI. Urine culture growing E. Coli with sensitivities pending.     -continue Condom cath in place to protect skin.   - Continue Vanc and Zosyn until sensitivities are back.   4. Gluteal abscess: mild fever on the evening of 8/21 and then a definite fever on 8/22 with Tmax of 101.5. Abscess was draining more pus with some surrounding erythema. Surgery has seen, did a bedside I&D  And packed the wound.  Vanc and Zosyn were started for broad coverage given the location as well as his incontinence. Wound culture gram stain showed S. Aureus with sensitives still pending.  We will wait for sensitivities before narrowing to try to choose an antibiotic that will cover both what is in his urine as well as his wound.   5. AORTIC STENOSIS/ INSUFFICIENCY: Per Dr. Rosalyn Charters records, Mr. Mode is a poor candidate for TAVR. His metoprolol dose was decreased due to hypotension and has been controlling his rate as well as his blood pressure. He may need increased dose as he gets to be more active again.   - Continue metoprolol and Lasix.   6. Atrial fibrillation: Pt has been in atrial fibrillation during this hospitalization.  INR is therapeutic. HR 110's today, we had resumed his metoprolol.  While working with PT on 8/24 his heart rate rose to the 160s.  He was asymptomatic and it returned to baseline when back in bed.    - Recheck orthostatics  - Consider holding lasix for a day or giving mild hydration  - Consider increasing his Metoprolol to his home dose.   - Continue warfarin regimen   7. HYPERTENSION, BENIGN: stable BP 132/72   -Continue metoprolol at 50 mg PO QDay. HR is slightly up today to 110's.   8. FEN/GI: Will continue senna with enema PRN. Will also give a glycerin suppository PRN as well.   - Continue current bowel regimen   -Continue PPI   9. Dispo: Plan for D/C to the SNF in [place] when he is ready. He will need to be afebrile for at least 24 hours and would be nice to be able to narrow the antibiotics based on the culture.   LOS: 8 days   Giordan Fordham 03/26/2012, 3:58 PM

## 2012-03-26 NOTE — Progress Notes (Signed)
Patient's HR elevated into the 160s while ambulating in hall with PT.  Pt has Chronic Afib.  Pt also had short beats of Vtach.  Patient has had this happen with previous sessions with PT.  Pt is very weak, but otherwise asymptomatic.  HR of 118 at rest in bed.  MD notified.  Will continue to monitor.

## 2012-03-26 NOTE — Progress Notes (Signed)
Resident Co-sign Daily Note: I have seen the patient and reviewed the daily progress note by Kathrin Ruddy MS IV and discussed the care of the patient with them.  See below for documentation of my findings, assessment, and plans.  Subjective: Doing well today.  Had a BM with the suppository.  Still not eating full meals.  No abdominal pain.  States he does have some mild burning with urination.  He continues to be incontinent.  Surgery performed I&D and packed the wound on his backside.  He denies pain.    Objective: Vital signs in last 24 hours: Filed Vitals:   03/25/12 0657 03/25/12 1400 03/25/12 2323 03/26/12 0511  BP: 127/70 97/52 145/95 119/73  Pulse: 84 93 100 109  Temp: 99.7 F (37.6 C) 98.6 F (37 C) 98.9 F (37.2 C) 98.3 F (36.8 C)  TempSrc: Oral     Resp: 16 20 18 18   Height:  6\' 2"  (1.88 m)    Weight:  279 lb (126.554 kg)    SpO2: 94% 93% 95% 93%   Physical Exam: Vitals reviewed. General: resting in bed, NAD HEENT: PERRL, EOMI, no scleral icterus Cardiac: Irregularly irregular rate and rhythm, no rubs, murmurs or gallops Pulm: clear to auscultation bilaterally, no wheezes, rales, or rhonchi Abd: soft, nontender, nondistended, BS present Ext: warm and well perfused, no pedal edema, erythema and tenderness on the right heal, Left gluteal abscess packed with iodoform gauze with minimal surrounding erythema.   Neuro: alert and oriented X3, cranial nerves II-XII grossly intact, strength and sensation to light touch equal in bilateral upper and lower extremities  Lab Results: Reviewed and documented in Electronic Record Micro Results: Reviewed and documented in Electronic Record Studies/Results: Reviewed and documented in Electronic Record Medications: I have reviewed the patient's current medications. Scheduled Meds:   . acetaminophen  1,000 mg Intravenous Q6H  . allopurinol  300 mg Oral Daily  . aspirin  81 mg Oral Daily  . carbidopa-levodopa  1 tablet Oral TID  .  furosemide  40 mg Oral Daily  . levothyroxine  175 mcg Oral Daily  . lidocaine-EPINEPHrine  10 mL Intradermal STAT  . metoprolol succinate  50 mg Oral Daily  . omega-3 acid ethyl esters  1 g Oral BID  . pantoprazole  40 mg Oral Q1200  . piperacillin-tazobactam (ZOSYN)  IV  3.375 g Intravenous Q8H  . polyethylene glycol  17 g Oral Daily  . senna-docusate  2 tablet Oral BID  . simvastatin  10 mg Oral q1800  . sucralfate  1 g Oral BID  . vancomycin  1,250 mg Intravenous Q12H  . Warfarin - Pharmacist Dosing Inpatient   Does not apply q1800  . DISCONTD: warfarin  2.5 mg Oral Q T,Th,S,Su-1800  . DISCONTD: warfarin  5 mg Oral Q M,W,F-1800   Continuous Infusions:  PRN Meds:.Glycerin (Adult), LORazepam  Assessment/Plan: 1. Weakness of both legs: On admission he has bilateral weakness of his legs as well as some mild altered mental status. His daughter is his primary caregiver and she is present today. He has had progressive weakness that has been getting slowly worse over the last few months but really started getting worse in the past week, concomitantly with his UTI. In addition, the "drop-off" in his strength and mental status came after learning that his wife was getting another surgery. Deconditioning 2/2 to decreased activity and staying with his wife in the hospital is likely playing a role in his weakness along with worsening Parkinsonian symptoms.  Weakness continues to improve. He has tolerated the increase of his Sinamet to TID without side effects. PT and OT continued to suggest Inpatient Rehab and rehab had accepted him but family on 8/21 had decided that they would like him to go to a SNF in [place] because his wife would be going there when she is discharged.  He continues to improve daily and is working with PT.   - Appreciate neurology recommendations with the medications. We will work to have them follow up  as an outpatient.   - PT/OT recs include inpatient rehab   - Plan to D/C to  SNF in [place] when he is ready to discharge.   2. Parkinson's Disease: According to his daughter, the patient had an episode of agitation and violence, went to a health care facility and was placed on an anti-psychotic agent for presumed agitation 2/2 schizophrenia. This has almost certainly worsened his parkinson's.   - Appreciate neuro recs regarding medications, if he has hallucinations the plans are to add Seroquel  at very low doses.   -Continue Sinemet   - D/C Invega   3. Urinary Incontinence/dysuria: overflow incontinence 2/2 BPH vs a inability to get to a bathroom before urination vs. Relation to his parkinson's progression. He has had some problems with dysuria and UA showed a definite UTI.  Preliminary species is Enterobacter.    -continue Condom cath in place to protect skin.   - Continue Vanc and Zosyn until sensitivities are back.   4. Gluteal abscess: mild fever overnight and the abscess is draining more pus today with some surrounding erythema. Surgery has seen and did a bedside I&D.  Vanc and Zosyn were started for broad coverage given the location as well as his incontinence.  Wound culture gram stain showed gram positive cocci in clusters and pairs.  We will wait for sensitivities before narrowing to try to choose an antibiotic that will cover both what is in his urine as well as his wound.   5. AORTIC STENOSIS/ INSUFFICIENCY: Per Dr. Rosalyn Charters records, Mr. Hoback is a poor candidate for TAVR. His metoprolol dose was decreased due to hypotension and has been controlling his rate as well as his blood pressure. He may need increased dose as he gets to be more active again.   - Continue metoprolol and Lasix.   6. Atrial fibrillation: Pt has been in atrial fibrillation during this hospitalization. INR is therapeutic. HR 110's today, we resumed his metoprolol yesterday, may need to increase dosage tomorrow   - Continue warfarin regimen   7. HYPERTENSION, BENIGN: stable BP 132/72    -Continue metoprolol at 50 mg PO QDay. HR is slightly up today to 110's. Metoprolol was just   resumed yesterday   8. FEN/GI:  Will continue senna with enema PRN. Will also give a glycerin suppository PRN as well.   - Continue current bowel regimen   -Will give GI cocktail for abdominal discomfort- this could be due to reflux   -Continue PPI   9. Dispo: Plan for D/C to the SNF in [place] when he is ready. He will need to be afebrile for at least 24 hours and would be nice to be able to narrow the antibiotics based on the culture.    LOS: 8 days   Tianni Escamilla 03/26/2012, 7:30 AM

## 2012-03-26 NOTE — Progress Notes (Signed)
  Subjective: Feels better  Objective: Vital signs in last 24 hours: Temp:  [98.3 F (36.8 C)-98.9 F (37.2 C)] 98.3 F (36.8 C) (08/24 0511) Pulse Rate:  [93-109] 109  (08/24 0511) Resp:  [18-20] 18  (08/24 0511) BP: (97-145)/(52-95) 119/73 mmHg (08/24 0511) SpO2:  [93 %-95 %] 93 % (08/24 0511) Weight:  [279 lb (126.554 kg)] 279 lb (126.554 kg) (08/23 1400) Last BM Date: 03/23/12  Intake/Output from previous day: 08/23 0701 - 08/24 0700 In: 600 [P.O.:600] Out: 950 [Urine:950] Intake/Output this shift:    Incision/Wound:buttock wound with packing in place.  Some bloody drainage.  Lab Results:   [facility] 03/25/12 1135  WBC 8.1  HGB 12.6*  HCT 37.6*  PLT 199   BMET  Basename 03/25/12 1135  NA 138  K 3.9  CL 101  CO2 25  GLUCOSE 90  BUN 22  CREATININE 0.99  CALCIUM 8.8   PT/INR  Basename 03/26/12 0610 03/25/12 0550  LABPROT 35.7* 34.3*  INR 3.51* 3.33*   ABG No results found for this basename: PHART:2,PCO2:2,PO2:2,HCO3:2 in the last 72 hours  Studies/Results: No results found.  Anti-infectives: Anti-infectives     Start     Dose/Rate Route Frequency Ordered Stop   03/24/12 1400   vancomycin (VANCOCIN) 1,250 mg in sodium chloride 0.9 % 250 mL IVPB        1,250 mg 166.7 mL/hr over 90 Minutes Intravenous Every 12 hours 03/24/12 1308     03/24/12 1400   piperacillin-tazobactam (ZOSYN) IVPB 3.375 g        3.375 g 12.5 mL/hr over 240 Minutes Intravenous 3 times per day 03/24/12 1308            Assessment/Plan: I and D perirectal/buttock abscess  IMPROVED. Packing out in 24 hours.   LOS: 8 days    Deriana Vanderhoef A. 03/26/2012

## 2012-03-27 LAB — WOUND CULTURE

## 2012-03-27 LAB — PROTIME-INR: INR: 3.1 — ABNORMAL HIGH (ref 0.00–1.49)

## 2012-03-27 MED ORDER — WARFARIN SODIUM 2.5 MG PO TABS
2.5000 mg | ORAL_TABLET | Freq: Once | ORAL | Status: AC
  Administered 2012-03-27: 2.5 mg via ORAL
  Filled 2012-03-27: qty 1

## 2012-03-27 MED ORDER — MUPIROCIN 2 % EX OINT
1.0000 "application " | TOPICAL_OINTMENT | Freq: Two times a day (BID) | CUTANEOUS | Status: DC
  Administered 2012-03-27 – 2012-03-29 (×5): 1 via NASAL
  Filled 2012-03-27: qty 22

## 2012-03-27 MED ORDER — SULFAMETHOXAZOLE-TMP DS 800-160 MG PO TABS
1.0000 | ORAL_TABLET | Freq: Two times a day (BID) | ORAL | Status: DC
  Administered 2012-03-27 – 2012-03-29 (×5): 1 via ORAL
  Filled 2012-03-27 (×6): qty 1

## 2012-03-27 MED ORDER — CHLORHEXIDINE GLUCONATE CLOTH 2 % EX PADS
6.0000 | MEDICATED_PAD | Freq: Every day | CUTANEOUS | Status: DC
  Administered 2012-03-27 – 2012-03-29 (×2): 6 via TOPICAL

## 2012-03-27 NOTE — Significant Event (Signed)
Pt. Had 3 beats of nonsustained V Tach . Son was shaving him @ this time. Denies SOB , denies chest pain,color  good , skin warm and dry. Pt. Does c/o left upper Quad. Abd. Pain. B/P 128/76   Pulse 105-110 resp. 28 O2 sats 94% On R/A. Pt. Has been c/o gas pains and constipation however he did have a large incont. BM today.

## 2012-03-27 NOTE — Progress Notes (Signed)
Patient ID: NASRI BOAKYE, male   DOB: 1929/03/02, 78 y.o.   MRN: [id number]    Subjective: Pt reports bottom feels much better now.  Only sore, no severe pain.  Objective: Vital signs in last 24 hours: Temp:  [97.7 F (36.5 C)-99.3 F (37.4 C)] 97.7 F (36.5 C) (08/25 0542) Pulse Rate:  [85-109] 85  (08/25 0542) Resp:  [18-20] 18  (08/25 0542) BP: (106-137)/(72-83) 106/83 mmHg (08/25 0542) SpO2:  [96 %-100 %] 97 % (08/25 0542) Last BM Date: 03/25/12  Intake/Output from previous day: 08/24 0701 - 08/25 0700 In: 120 [P.O.:120] Out: 1250 [Urine:1250] Intake/Output this shift:    PE: Buttocks: left buttocks with less induration, abscess cavity well drained with beefy red tissue inside, less erythema   Lab Results:   Basename 03/25/12 1135  WBC 8.1  HGB 12.6*  HCT 37.6*  PLT 199   BMET  Basename 03/26/12 1240 03/25/12 1135  NA 136 138  K 3.4* 3.9  CL 102 101  CO2 25 25  GLUCOSE 114* 90  BUN 20 22  CREATININE 0.99 0.99  CALCIUM 8.7 8.8   PT/INR  Basename 03/27/12 0515 03/26/12 0610  LABPROT 32.4* 35.7*  INR 3.10* 3.51*   CMP     Component Value Date/Time   NA 136 03/26/2012 1240   K 3.4* 03/26/2012 1240   CL 102 03/26/2012 1240   CO2 25 03/26/2012 1240   GLUCOSE 114* 03/26/2012 1240   BUN 20 03/26/2012 1240   CREATININE 0.99 03/26/2012 1240   CALCIUM 8.7 03/26/2012 1240   PROT 6.7 03/18/2012 1038   ALBUMIN 3.2* 03/18/2012 1038   AST 15 03/18/2012 1038   ALT 8 03/18/2012 1038   ALKPHOS 86 03/18/2012 1038   BILITOT 0.8 03/18/2012 1038   GFRNONAA 75* 03/26/2012 1240   GFRAA 87* 03/26/2012 1240   Lipase  No results found for this basename: lipase       Studies/Results: No results found.  Anti-infectives: Anti-infectives     Start     Dose/Rate Route Frequency Ordered Stop   03/24/12 1400   vancomycin (VANCOCIN) 1,250 mg in sodium chloride 0.9 % 250 mL IVPB        1,250 mg 166.7 mL/hr over 90 Minutes Intravenous Every 12 hours 03/24/12 1308     03/24/12  1400  piperacillin-tazobactam (ZOSYN) IVPB 3.375 g       3.375 g 12.5 mL/hr over 240 Minutes Intravenous 3 times per day 03/24/12 1308             Assessment/Plan  1.  Abscess:  Well drained, healthy appearing, can d/c dressing changes and start sitz bathes three times daily then apply dry dressing after each bath.  If patient unable to do sitz bathes then would go back to dressing changes daily with packing.  Abx per cultures pending.  LOS: 9 days    WHITE, ELIZABETH 03/27/2012

## 2012-03-27 NOTE — Progress Notes (Signed)
PHARMACY CONSULT NOTE - Follow Up Consult  Pharmacy Consult for warfarin, vancomycin, and Zosyn Indication: atrial fibrillation/left gluteal abscess and celluitis  Allergies  Allergen Reactions  . Morphine And Related     Goes Crazy  . Nifedipine Other (See Comments)    "makes me go crazy"  . Nitroglycerin     Patient Measurements: Height: 6\' 2"  (188 cm) Weight: 279 lb (126.554 kg) IBW/kg (Calculated) : 82.2   Vital Signs: Temp: 97.7 F (36.5 C) (08/25 0542) BP: 106/83 mmHg (08/25 0542) Pulse Rate: 85  (08/25 0542)  Labs:  Basename 03/27/12 0515 03/26/12 1240 03/26/12 0610 03/25/12 1135 03/25/12 0550  HGB -- -- -- 12.6* --  HCT -- -- -- 37.6* --  PLT -- -- -- 199 --  APTT -- -- -- -- --  LABPROT 32.4* -- 35.7* -- 34.3*  INR 3.10* -- 3.51* -- 3.33*  HEPARINUNFRC -- -- -- -- --  CREATININE -- 0.99 -- 0.99 --  CKTOTAL -- -- -- -- --  CKMB -- -- -- -- --  TROPONINI -- -- -- -- --    Estimated Creatinine Clearance: 84.2 ml/min (by C-G formula based on Cr of 0.99).   Medications:  Prescriptions prior to admission  Medication Sig Dispense Refill  . acetaminophen (ARTHRITIS PAIN RELIEF) 650 MG CR tablet Take 650 mg by mouth. Take 2 tabs AM and 1 tab PM       . allopurinol (ZYLOPRIM) 300 MG tablet Take 300 mg by mouth daily.        Marland Kitchen aspirin 81 MG chewable tablet Chew 81 mg by mouth daily.      . captopril (CAPOTEN) 25 MG tablet Take 25 mg by mouth 2 (two) times daily. As directed        . Ciprofloxacin (CIPRO PO) Take by mouth 2 times daily at 12 noon and 4 pm.       . Cyanocobalamin (VITAMIN B-12 IJ) Inject as directed every 30 (thirty) days.      . furosemide (LASIX) 80 MG tablet Take 80 mg by mouth daily.        Marland Kitchen levothyroxine (SYNTHROID) 175 MCG tablet Take 175 mcg by mouth daily.        Marland Kitchen LORazepam (ATIVAN) 1 MG tablet Take 1 mg by mouth as needed. For anxiety      . metoprolol (TOPROL XL) 50 MG 24 hr tablet Take 50 mg by mouth daily.        . Misc Natural  Products (OSTEO BI-FLEX ADV DOUBLE ST PO) Take by mouth. Take 2 tabs daily       . Omega-3 Fatty Acids (FISH OIL) 1000 MG CAPS Take by mouth. Take 1 capsule by mouth two times a day       . Omeprazole 20 MG TBEC Take by mouth. 1 tablet once a day       . pravastatin (PRAVACHOL) 10 MG tablet Take 10 mg by mouth daily. Take 2 tabs daily       . sucralfate (CARAFATE) 1 G tablet Take 1 g by mouth 2 (two) times daily.        Marland Kitchen warfarin (COUMADIN) 2.5 MG tablet Take 2.5-5 mg by mouth daily. Takes 2.5mg  (1 tablet) daily except 5mg  (2 tablets) on Monday, Wednesday, and Friday      . paliperidone (INVEGA) 3 MG 24 hr tablet Take 3 mg by mouth daily.          Assessment: 78 y/o male with afib on Coumadin  PTA  2.5 mg TTSS and 5 mg MWF - verified with pt and daughter.  INR jumped to supratherapeutic level previously and has now fallen to 3.1 today.  No bleeding noted.   Pt now afebrile and WBC wnl on 8/23.  Urine cx grew out E.coli sensitive to Zosyn, Ancef, Rocephin, nitrofurantoin, and Bactrim. Wound cx grew out Staph aureus, pending sensitivities.   Goal of Therapy:  INR 2-3 Monitor platelets by anticoagulation protocol: Yes Vancomycin trough level 15-20 mcg/ml    Plan:  - Coumadin 2.5 mg po x1 tonight. - INR in am - Continue Vancomycin 1250 mg IV q12h - Continue Zosyn 3.375 g IV q8h to be infused over 4 hours - Follow-up renal function and plan on antibiotic before checking vanc trough     Doris Cheadle, PharmD Clinical Pharmacist Pager: [phone] Phone: [phone] 03/27/2012 8:25 AM

## 2012-03-27 NOTE — Progress Notes (Signed)
Packing removed.  Will try sitz baths today.  If pt unable to do sitz baths, will restart packing.

## 2012-03-27 NOTE — Progress Notes (Signed)
Subjective: Doing well today.  Having soft BMs, several times daily.  Packing removed by surgery today.  Minimal drainage and no pain.  Breathing well and drinking well.  States his appetite is not good.   Objective: Vital signs in last 24 hours: Filed Vitals:   03/26/12 1329 03/26/12 2218 03/27/12 0430 03/27/12 0542  BP: 127/72 137/79 132/79 106/83  Pulse: 100 92 109 85  Temp: 97.9 F (36.6 C) 99.3 F (37.4 C)  97.7 F (36.5 C)  TempSrc: Oral     Resp: 18 18 20 18   Height:      Weight:      SpO2: 96% 96% 100% 97%   Weight change:   Intake/Output Summary (Last 24 hours) at 03/27/12 1247 Last data filed at 03/26/12 2300  Gross per 24 hour  Intake    120 ml  Output   1250 ml  Net  -1130 ml   Vitals reviewed. General: resting in bed, NAD  HEENT: PERRL, EOMI, no scleral icterus  Cardiac: irregularly irregular rate and rhythm, 3/6 systolic ejection murmur best heard in the LUSB with radiation to the carotids. no rubs, or gallops  Pulm: mild inspiratory crackles in the bases bilaterally. no wheezes, rales, or rhonchi  Abd: soft, nontender, nondistended, BS present  Ext: warm and well perfused, no pedal edema, bilateral boots present. Bandage in place over left gluteal wound with mild drainage noted today, no erythema surrounding today.  Neuro: masked facies, alert and oriented X3, cranial nerves II-XII grossly intact, strength is 5/5 in the bilateral upper and lower extremities. Sensation to light touch equal in bilateral upper and lower extremities  Lab Results: Basic Metabolic Panel:  Lab 03/26/12 4132 03/25/12 1135  NA 136 138  K 3.4* 3.9  CL 102 101  CO2 25 25  GLUCOSE 114* 90  BUN 20 22  CREATININE 0.99 0.99  CALCIUM 8.7 8.8  MG 1.9 --  PHOS 3.1 --   CBC:  Lab 03/25/12 1135 03/21/12 0515  WBC 8.1 7.3  NEUTROABS -- --  HGB 12.6* 12.0*  HCT 37.6* 35.4*  MCV 97.9 98.1  PLT 199 181   Coagulation:  Lab 03/27/12 0515 03/26/12 0610 03/25/12 0550 03/23/12 0500    LABPROT 32.4* 35.7* 34.3* 25.0*  INR 3.10* 3.51* 3.33* 2.22*   Urinalysis:  Lab 03/24/12 1448  COLORURINE YELLOW  LABSPEC 1.016  PHURINE 5.5  GLUCOSEU NEGATIVE  HGBUR LARGE*  BILIRUBINUR NEGATIVE  KETONESUR NEGATIVE  PROTEINUR NEGATIVE  UROBILINOGEN 1.0  NITRITE POSITIVE*  LEUKOCYTESUR LARGE*   Micro Results: Recent Results (from the past 240 hour(s))  WOUND CULTURE     Status: Normal   Collection Time   03/24/12 12:54 PM      Component Value Range Status Comment   Specimen Description WOUND LEFT BUTTOCKS   Final    Special Requests NONE   Final    Gram Stain     Final    Value: ABUNDANT WBC PRESENT,BOTH PMN AND MONONUCLEAR     NO SQUAMOUS EPITHELIAL CELLS SEEN     FEW GRAM POSITIVE COCCI     IN PAIRS IN CLUSTERS   Culture     Final    Value: MODERATE METHICILLIN RESISTANT STAPHYLOCOCCUS AUREUS     Note: RIFAMPIN AND GENTAMICIN SHOULD NOT BE USED AS SINGLE DRUGS FOR TREATMENT OF STAPH INFECTIONS. This organism DOES NOT demonstrate inducible Clindamycin resistance in vitro. CRITICAL RESULT CALLED TO, READ BACK BY AND VERIFIED WITH: ALEX COLE@1049  ON  [id number] BY [facility]   Report Status 03/27/2012 FINAL   Final    Organism ID, Bacteria METHICILLIN RESISTANT STAPHYLOCOCCUS AUREUS   Final   URINE CULTURE     Status: Normal   Collection Time   03/24/12  2:48 PM      Component Value Range Status Comment   Specimen Description URINE, CLEAN CATCH   Final    Special Requests NONE   Final    Culture  Setup Time 03/25/2012 02:29   Final    Colony Count >=100,000 COLONIES/ML   Final    Culture ESCHERICHIA COLI   Final    Report Status 03/26/2012 FINAL   Final    Organism ID, Bacteria ESCHERICHIA COLI   Final    Studies/Results: No results found. Medications: I have reviewed the patient's current medications. Scheduled Meds:   . allopurinol  300 mg Oral Daily  . aspirin  81 mg Oral Daily  . carbidopa-levodopa  1 tablet Oral TID  . Chlorhexidine Gluconate Cloth  6 each  Topical Q0600  . furosemide  40 mg Oral Daily  . levothyroxine  175 mcg Oral Daily  . metoprolol succinate  50 mg Oral Daily  . mupirocin ointment  1 application Nasal BID  . omega-3 acid ethyl esters  1 g Oral BID  . pantoprazole  40 mg Oral Q1200  . polyethylene glycol  17 g Oral Daily  . potassium chloride  40 mEq Oral Once  . senna-docusate  2 tablet Oral BID  . simvastatin  10 mg Oral q1800  . sucralfate  1 g Oral BID  . sulfamethoxazole-trimethoprim  1 tablet Oral Q12H  . warfarin  2.5 mg Oral ONCE-1800  . Warfarin - Pharmacist Dosing Inpatient   Does not apply q1800  . DISCONTD: piperacillin-tazobactam (ZOSYN)  IV  3.375 g Intravenous Q8H  . DISCONTD: vancomycin  1,250 mg Intravenous Q12H   Continuous Infusions:  PRN Meds:.Glycerin (Adult), LORazepam  Assessment/Plan: 1. Weakness of both legs: On admission he has bilateral weakness of his legs as well as some mild altered mental status. His daughter is his primary caregiver and she is present today. He has had progressive weakness that has been getting slowly worse over the last few months but really started getting worse in the past week, concomitantly with his UTI. In addition, the "drop-off" in his strength and mental status came after learning that his wife was getting another surgery. Deconditioning 2/2 to decreased activity and staying with his wife in the hospital is likely playing a role in his weakness along with worsening Parkinsonian symptoms. Weakness continues to improve. He has tolerated the increase of his Sinamet to TID without side effects. PT and OT continued to suggest Inpatient Rehab and rehab had accepted him but family on 8/21 had decided that they would like him to go to a SNF in [place] because his wife would be going there when she is discharged. He continues to improve daily and is working with PT.   - Appreciate neurology recommendations with the medications. We will work to have them  follow up as an  outpatient.   - PT/OT recs include inpatient rehab   - Plan to D/C to SNF in [place] likely Monday  2. Parkinson's Disease: According to his daughter, the patient had an episode of agitation and violence, went to a health care facility and was placed on an anti-psychotic agent for presumed agitation 2/2 schizophrenia. This has almost certainly worsened his parkinson's. He has not had  any hallucinations since starting on the Sinamet.   - Appreciate neuro recs regarding medications, if he has hallucinations the plans are to add  Seroquel at very low doses.   -Continue Sinemet   - D/C Invega   3. Urinary Incontinence/dysuria: overflow incontinence 2/2 BPH vs a inability to get to a bathroom before urination vs. Relation to his parkinson's progression. He has had some problems with dysuria and UA showed a definite UTI. Urine culture growing E. Coli sensitive to Bactrim.   -continue Condom cath in place to protect skin.   - Change to Bactrim today to cover both urine as well as the gluteal abscess.   4. Gluteal abscess: mild fever on the evening of 8/21 and then a definite fever on 8/22 with Tmax of 101.5. Abscess was draining more pus with some surrounding erythema. Surgery saw and did a bedside I&D And packed the wound. Vanc and Zosyn were started for broad coverage given the location as well as his incontinence. Wound culture gram stain showed MRSA sensitive to bactrim. Packing removed by Surgery and they are attempting Sitz baths.  If that doesn't work out they will repack the wound.    - Switch to bactrim today for 11 more days with last dose given on 04/06/2012.  5. AORTIC STENOSIS/ INSUFFICIENCY: Per Dr. Rosalyn Charters records, Mr. Callegari is a poor candidate for TAVR. His metoprolol dose was decreased due to hypotension and has been controlling his rate as well as his blood pressure. He may need increased dose as he gets to be more active again but currently is still mildly hypotensive when lying in bed.    - Continue metoprolol and Lasix.   6. Atrial fibrillation: Pt has been in atrial fibrillation during this hospitalization. INR is therapeutic. HR 110's today, we had resumed his metoprolol. While working with PT on 8/24 his heart rate rose to the 160s. He was asymptomatic and it returned to baseline when back in bed.  Orthostatics were negative.  Plan today to increase his metoprolol to his home dose.   - Continue warfarin regimen   7. HYPERTENSION, BENIGN: stable BP 132/72   -increase metoprolol to 100 mg PO QDay. HR is slightly up today to 110's.   8. FEN/GI: Will continue senna with enema PRN. Will also give a glycerin suppository PRN as well.   - Continue current bowel regimen   -Continue PPI   9. Dispo: Plan for D/C to the SNF in [place] Likely Monday with a total of 14 days of antibiotic treatment for his wound.    LOS: 9 days   [name] Graham 03/27/2012, 12:47 PM

## 2012-03-28 ENCOUNTER — Encounter (HOSPITAL_COMMUNITY): Payer: Self-pay | Admitting: Internal Medicine

## 2012-03-28 DIAGNOSIS — R32 Unspecified urinary incontinence: Secondary | ICD-10-CM

## 2012-03-28 DIAGNOSIS — N390 Urinary tract infection, site not specified: Secondary | ICD-10-CM | POA: Diagnosis not present

## 2012-03-28 HISTORY — DX: Unspecified urinary incontinence: R32

## 2012-03-28 LAB — CLOSTRIDIUM DIFFICILE BY PCR: Toxigenic C. Difficile by PCR: NEGATIVE

## 2012-03-28 LAB — PROTIME-INR
INR: 2.95 — ABNORMAL HIGH (ref 0.00–1.49)
Prothrombin Time: 31.2 seconds — ABNORMAL HIGH (ref 11.6–15.2)

## 2012-03-28 MED ORDER — WARFARIN SODIUM 2 MG PO TABS
2.0000 mg | ORAL_TABLET | Freq: Every day | ORAL | Status: DC
  Filled 2012-03-28 (×3): qty 1

## 2012-03-28 NOTE — Progress Notes (Signed)
PHARMACY CONSULT NOTE - Follow Up Consult  Pharmacy Consult for warfarin,  Indication: atrial fibrillation, hx B PE Allergies  Allergen Reactions  . Morphine And Related     Goes Crazy  . Nifedipine Other (See Comments)    "makes me go crazy"  . Nitroglycerin     Patient Measurements: Height: 6\' 2"  (188 cm) Weight: 279 lb (126.554 kg) IBW/kg (Calculated) : 82.2   Vital Signs:    Labs:  Basename 03/28/12 0543 03/27/12 0515 03/26/12 1240 03/26/12 0610 03/25/12 1135  HGB -- -- -- -- 12.6*  HCT -- -- -- -- 37.6*  PLT -- -- -- -- 199  APTT -- -- -- -- --  LABPROT 31.2* 32.4* -- 35.7* --  INR 2.95* 3.10* -- 3.51* --  HEPARINUNFRC -- -- -- -- --  CREATININE -- -- 0.99 -- 0.99  CKTOTAL -- -- -- -- --  CKMB -- -- -- -- --  TROPONINI -- -- -- -- --    Estimated Creatinine Clearance: 84.2 ml/min (by C-G formula based on Cr of 0.99).   Assessment: 77 y/o male with afib on Coumadin PTA  2.5 mg TTSS and 5 mg MWF - verified with pt and daughter.  INR jumped to supratherapeutic level previously and has now fallen to 2.95 today.  No bleeding noted. He was started on septra yesterday for MRSA wound infection and E coli UTI. This will increase his INR.  Goal of Therapy:  INR 2-3   Plan:  - Coumadin 2 mg po daily - watch INR closely as septra drug interaction will increase INR. Herby Abraham, Pharm.D. [phone] 03/28/2012 10:03 AM

## 2012-03-28 NOTE — Progress Notes (Signed)
Physical Therapy Treatment Patient Details Name: [name] Graham MRN: [id number] DOB: September 05, 1928 Today's Date: 03/28/2012 Time: 7829-5621 PT Time Calculation (min): 27 min  PT Assessment / Plan / Recommendation Comments on Treatment Session  Pt demonstrated increased deficits this treatment session secondary to increased tremors and retropulsion. Pt will continue to benefit from skilled PT to improve functional mobility and activity tolerance.    Follow Up Recommendations  Inpatient Rehab;Supervision/Assistance - 24 hour    Barriers to Discharge        Equipment Recommendations  Defer to next venue    Recommendations for Other Services Rehab consult  Frequency Min 4X/week   Plan Discharge plan remains appropriate;Frequency remains appropriate    Precautions / Restrictions Precautions Precautions: Fall Restrictions Weight Bearing Restrictions: No   Pertinent Vitals/Pain 3/10    Mobility  Bed Mobility Bed Mobility: Supine to Sit;Sitting - Scoot to Edge of Bed Supine to Sit: With rails;HOB flat;1: +2 Total assist;3: Mod assist Sitting - Scoot to Edge of Bed: 2: Max assist Details for Bed Mobility Assistance: Pt req more assist today as his tremors were more intense this am Transfers Transfers: Sit to Stand;Stand to Sit Sit to Stand: 1: +2 Total assist;With upper extremity assist;From bed;From chair/3-in-1 Sit to Stand: Patient Percentage: 60% Stand to Sit: 1: +2 Total assist;With upper extremity assist;To chair/3-in-1 Stand to Sit: Patient Percentage: 70% Details for Transfer Assistance: Assist req due to increased tremors this am Ambulation/Gait Ambulation/Gait Assistance: 1: +2 Total assist;3: Mod assist Ambulation Distance (Feet): 120 Feet Assistive device: Rolling walker Ambulation/Gait Assistance Details: +2 assist for chair follow secondary to unsteadiness and safety.  Pt had significant tremors and retropulsion this morning,. Pt required manual assist for rolling  walker advancement. Ambulation better with decreased tactile input. Gait Pattern: Step-to pattern;Decreased stride length;Trunk flexed;Decreased trunk rotation;Festinating Gait velocity: decreased      PT Goals Acute Rehab PT Goals PT Goal Formulation: With patient PT Goal: Supine/Side to Sit - Progress: Progressing toward goal PT Goal: Sit to Supine/Side - Progress: Progressing toward goal PT Goal: Sit to Stand - Progress: Progressing toward goal PT Goal: Stand to Sit - Progress: Progressing toward goal Pt will Ambulate: >150 feet;with supervision;with rolling walker PT Goal: Ambulate - Progress: Progressing toward goal  Visit Information  Last PT Received On: 03/28/12 Assistance Needed: +2    Subjective Data  Subjective: i feel like today wasn't so good   Cognition  Overall Cognitive Status: Appears within functional limits for tasks assessed/performed Arousal/Alertness: Awake/alert Orientation Level: Appears intact for tasks assessed Behavior During Session: [facility] for tasks performed    Balance     End of Session PT - End of Session Equipment Utilized During Treatment: Gait belt Activity Tolerance: Patient tolerated treatment well Patient left: in chair;with call bell/phone within reach Nurse Communication: Mobility status   GP     Fabio Asa 03/28/2012, 1:30 PM Charlotte Crumb, PT DPT  [phone]

## 2012-03-28 NOTE — Progress Notes (Addendum)
Patient ID: [name] Graham, male   DOB: September 02, 1928, 78 y.o.   MRN: [id number] Patient ID: [name] Graham, male   DOB: 1929-05-16, 78 y.o.   MRN: [id number]    Subjective: Pt reports bottom feels much better now.  Only sore, no severe pain.  Objective: Vital signs in last 24 hours: Temp:  [98.2 F (36.8 C)-98.3 F (36.8 C)] 98.3 F (36.8 C) (08/25 2057) Pulse Rate:  [81-86] 86  (08/25 2057) Resp:  [16-18] 16  (08/25 2057) BP: (115-130)/(74-77) 115/77 mmHg (08/25 2057) SpO2:  [98 %-99 %] 99 % (08/25 2057) Last BM Date: 03/27/12  Intake/Output from previous day: 08/25 0701 - 08/26 0700 In: 120 [P.O.:120] Out: 700 [Urine:700] Intake/Output this shift: Total I/O In: 240 [P.O.:240] Out: 1 [Stool:1]  PE: Buttocks: left buttocks with less induration, abscess cavity well drained with beefy red tissue inside, less erythema.  Lab Results:   Basename 03/25/12 1135  WBC 8.1  HGB 12.6*  HCT 37.6*  PLT 199   BMET  Basename 03/26/12 1240 03/25/12 1135  NA 136 138  K 3.4* 3.9  CL 102 101  CO2 25 25  GLUCOSE 114* 90  BUN 20 22  CREATININE 0.99 0.99  CALCIUM 8.7 8.8   PT/INR  Basename 03/28/12 0543 03/27/12 0515  LABPROT 31.2* 32.4*  INR 2.95* 3.10*   CMP     Component Value Date/Time   NA 136 03/26/2012 1240   K 3.4* 03/26/2012 1240   CL 102 03/26/2012 1240   CO2 25 03/26/2012 1240   GLUCOSE 114* 03/26/2012 1240   BUN 20 03/26/2012 1240   CREATININE 0.99 03/26/2012 1240   CALCIUM 8.7 03/26/2012 1240   PROT 6.7 03/18/2012 1038   ALBUMIN 3.2* 03/18/2012 1038   AST 15 03/18/2012 1038   ALT 8 03/18/2012 1038   ALKPHOS 86 03/18/2012 1038   BILITOT 0.8 03/18/2012 1038   GFRNONAA 75* 03/26/2012 1240   GFRAA 87* 03/26/2012 1240   Lipase  No results found for this basename: lipase       Studies/Results: No results found.  Anti-infectives: Anti-infectives     Start     Dose/Rate Route Frequency Ordered Stop   03/27/12 1400  sulfamethoxazole-trimethoprim (BACTRIM DS) 800-160  MG per tablet 1 tablet       1 tablet Oral Every 12 hours 03/27/12 1244     03/24/12 1400   vancomycin (VANCOCIN) 1,250 mg in sodium chloride 0.9 % 250 mL IVPB  Status:  Discontinued        1,250 mg 166.7 mL/hr over 90 Minutes Intravenous Every 12 hours 03/24/12 1308 03/27/12 1244   03/24/12 1400   piperacillin-tazobactam (ZOSYN) IVPB 3.375 g  Status:  Discontinued        3.375 g 12.5 mL/hr over 240 Minutes Intravenous 3 times per day 03/24/12 1308 03/27/12 1244           Assessment/Plan  1.  Abscess:  Well drained, healthy appearing, can d/c dressing changes and start sitz bathes three times daily then apply dry dressing after each bath.  If patient unable to do sitz bathes then would go back to dressing changes daily with packing.  Abx per cultures pending. 2. Stable for discharge from surgical standpoint once SNF placement is confirmed..    LOS: 10 days    DUANNE, JEREMY 03/28/2012   Addendum:  Follow-up in two weeks with Dr. Janee Morn.  Wilmon Arms. Corliss Skains, MD, [facility] Surgery  03/28/2012 9:22 AM

## 2012-03-28 NOTE — Progress Notes (Signed)
Internal Medicine Attending  Date: 03/28/2012  Patient name: [name] Graham Medical record number: [id number] Date of birth: 09-21-1928 Age: 78 y.o. Gender: male  I saw and evaluated the patient on a.m. rounds with house staff.  He reports that ambulation with assistance this morning was somewhat more difficult, but otherwise has no new complaint.  He is afebrile with stable vitals.  Exam is notable for clear lungs; regular rhythm; soft nontender abdomen; no edema.  His INR is 2.95.  His wound culture from 03/24/2012 is growing MRSA with sensitivities indicating community-acquired MRSA; urine culture is growing Escherichia coli.  Patient is now on oral Bactrim started yesterday, which covers both organisms.  Given the potential interaction of Bactrim with his warfarin, he will need very close monitoring of his INR while on antibiotics.  Anticipate possible discharge to skilled nursing facility tomorrow.

## 2012-03-28 NOTE — Progress Notes (Signed)
Rehab admissions - I received a call from resident asking for a re-consult for inpatient rehab.  I saw the note from SW stating that patient will discharge to [facility] in the am.  Family does want SNF placement and not inpatient rehab at this time.  I will hold on further involvement for inpatient rehab at this time.  Call me for questions.  #[phone]

## 2012-03-28 NOTE — Progress Notes (Signed)
Subjective: Pt doing well today. Family wants him to go to [facility] in [place] with wife and pt is ready for discharge.  Objective: Vital signs in last 24 hours: Filed Vitals:   03/27/12 0542 03/27/12 1428 03/27/12 2057 03/28/12 1300  BP: 106/83 130/74 115/77 104/80  Pulse: 85 81 86 107  Temp: 97.7 F (36.5 C) 98.2 F (36.8 C) 98.3 F (36.8 C) 98.7 F (37.1 C)  TempSrc:  Oral Oral   Resp: 18 18 16 18   Height:      Weight:      SpO2: 97% 98% 99% 95%   Weight change:   Intake/Output Summary (Last 24 hours) at 03/28/12 1848 Last data filed at 03/28/12 1300  Gross per 24 hour  Intake    480 ml  Output    901 ml  Net   -421 ml   Vitals reviewed. General: resting in recliner, NAD HEENT:  no scleral icterus, masked facie Cardiac: +systolic murmur Pulm: clear to auscultation bilaterally, no wheezes, rales, or rhonchi Abd: soft, nontender, nondistended, BS present (normal), obese Ext: warm and well perfused, no pedal edema Neuro: alert and oriented X3, resting tremor related to PD   Lab Results: Coagulation:  Lab 03/28/12 0543 03/27/12 0515 03/26/12 0610 03/25/12 0550  LABPROT 31.2* 32.4* 35.7* 34.3*  INR 2.95* 3.10* 3.51* 3.33*    Misc. Labs: None   Micro Results: Recent Results (from the past 240 hour(s))  WOUND CULTURE     Status: Normal   Collection Time   03/24/12 12:54 PM      Component Value Range Status Comment   Specimen Description WOUND LEFT BUTTOCKS   Final    Special Requests NONE   Final    Gram Stain     Final    Value: ABUNDANT WBC PRESENT,BOTH PMN AND MONONUCLEAR     NO SQUAMOUS EPITHELIAL CELLS SEEN     FEW GRAM POSITIVE COCCI     IN PAIRS IN CLUSTERS   Culture     Final    Value: MODERATE METHICILLIN RESISTANT STAPHYLOCOCCUS AUREUS     Note: RIFAMPIN AND GENTAMICIN SHOULD NOT BE USED AS SINGLE DRUGS FOR TREATMENT OF STAPH INFECTIONS. This organism DOES NOT demonstrate inducible Clindamycin resistance in vitro. CRITICAL RESULT CALLED TO, READ BACK  BY AND VERIFIED WITH: ALEX COLE@1049  ON      [id number] BY [facility]   Report Status 03/27/2012 FINAL   Final    Organism ID, Bacteria METHICILLIN RESISTANT STAPHYLOCOCCUS AUREUS   Final   URINE CULTURE     Status: Normal   Collection Time   03/24/12  2:48 PM      Component Value Range Status Comment   Specimen Description URINE, CLEAN CATCH   Final    Special Requests NONE   Final    Culture  Setup Time 03/25/2012 02:29   Final    Colony Count >=100,000 COLONIES/ML   Final    Culture ESCHERICHIA COLI   Final    Report Status 03/26/2012 FINAL   Final    Organism ID, Bacteria ESCHERICHIA COLI   Final   CLOSTRIDIUM DIFFICILE BY PCR     Status: Normal   Collection Time   03/27/12  7:07 PM      Component Value Range Status Comment   C difficile by pcr NEGATIVE  NEGATIVE Final    Studies/Results: No results found. Medications:  Scheduled Meds:    . allopurinol  300 mg Oral Daily  . aspirin  81 mg Oral  Daily  . carbidopa-levodopa  1 tablet Oral TID  . Chlorhexidine Gluconate Cloth  6 each Topical Q0600  . furosemide  40 mg Oral Daily  . levothyroxine  175 mcg Oral Daily  . metoprolol succinate  50 mg Oral Daily  . mupirocin ointment  1 application Nasal BID  . omega-3 acid ethyl esters  1 g Oral BID  . pantoprazole  40 mg Oral Q1200  . polyethylene glycol  17 g Oral Daily  . senna-docusate  2 tablet Oral BID  . simvastatin  10 mg Oral q1800  . sucralfate  1 g Oral BID  . sulfamethoxazole-trimethoprim  1 tablet Oral Q12H  . warfarin  2 mg Oral q1800  . Warfarin - Pharmacist Dosing Inpatient   Does not apply q1800   Continuous Infusions:  PRN Meds:.Glycerin (Adult), LORazepam Assessment/Plan: 78 y.o PMH b/l PE, hypothroidism, HTN, CAD, severe Aortic stenosis, chronic atrial fibrillation, PD with tremor presented 8/16 for worsening lower ext weakness, urinary freq and incontinence, left gluteal abscess.      1. Parkinson's disease  -Neuro consulted this admission -weakness on  admission thought to be due to worsening PD as pt was NOT on tx prior to admission -continues to improve on Sinemet, d/c Invega -pt will need f/u at [facility] neurolog[facility]   -will need PT/OT f/u outpt  2. E.coli UTI (lower urinary tract infection)  -sensitive to Bactrim started 8/25 -tried to call lab to add on Tetracycline sensitivity but urine already thrown away; prev c/w DDI Bactrim and Coumadin but will monitor Coumadin strictly  -h/o recurrent UTI  3. Urinary incontinence  -we added condom cath this admission -pt will need to f/u outpatient urology  4. Chronic atrial fibrillation  -monitor INR daily, INR 2.95 today  -pt on Coumadin and Bactrim so INR will need to be monitored closely daily even once off Bactrim -Coumadin dose decreased since admission to supratherapeutic range this admission -will need titration of dose in the future  5. HYPERTENSION -D/c'ed Captopril this admission d/t hypotensive episodes, Toprol XL 50 mg qd   -will hold d/t contraindication in severe AS   6. H/o b/l PULMONARY EMBOLISM -Cont Coumadin   7. HYPERCHOLESTEROLEMIA   -Zocor 10 mg qhs   8. AORTIC STENOSIS, severe, CAD -systolic murmer noted on exam -ASA 81 mg qd, cont Toprol and Lasix  -will need outpatient f/u with Dr. [name]  9. MRSA Wound + -left buttock -surgery consulted this admission -MRSA protocol -Cont. Bactrim x 14 days. Pt needs 12 more days started 8/25  10. Constipation -bowel regimin with suppositories prn, Miralax   11. Hypothyroidism -Synthroid 175 mcg  12. GI Px -Protonix   13. F/E/N -will monitor electrolytes in the am -diet ordered  14. Dispo -will send to SNF unable to get 1st choice preference [facility] today Trula Ore working on other placement and possibly d/c in the am   LOS: 10 days   Annett Gula 03/28/2012, 6:48 PM

## 2012-03-28 NOTE — Progress Notes (Signed)
Clinical Social Work-CSW left message for family members to review alternate bed offers-CSW will facilitate d/c as soon we are able to contact family- Jodean Lima, [phone]

## 2012-03-28 NOTE — Progress Notes (Signed)
Clinical Social Work-CSW provided family with additional offers and family has chosen [facility] SN[facility]-CSW has sent SNF additional information and arranging insurance authorization-Pt will d/c to [facility] in AM once they have secured mediations and approval- Jodean Lima, [phone]

## 2012-03-28 NOTE — Discharge Summary (Signed)
Internal Medicine Teaching [facility] Discharge Note  Name: [name] Graham MRN: [id number] DOB: 1928/10/29 78 y.o.  Date of Admission: 03/18/2012  9:58 AM Date of Discharge: 03/28/2012 Attending Physician: Farley Ly, MD  Discharge Diagnosis: 1. Progressive Parkinson Disease  2. Deconditioning 3. Urinary tract infection 4.  Abscess of the left gluteal fold 5.  AORTIC STENOSIS 6. Atrial fibrillation on chronic coumadin 7.  Hypertension 8. Macrocytic anemia 9. Constipation 10. Hypercholesterolemia  Discharge Medications: Medication List  As of 03/29/2012  1:33 PM   STOP taking these medications         CAPOTEN 25 MG tablet      CIPRO PO      INVEGA 3 MG 24 hr tablet         TAKE these medications         ARTHRITIS PAIN RELIEF 650 MG CR tablet   Generic drug: acetaminophen   Take 650 mg by mouth. Take 2 tabs AM and 1 tab PM      aspirin 81 MG chewable tablet   Chew 81 mg by mouth daily.      carbidopa-levodopa 50-200 MG per tablet   Commonly known as: SINEMET CR   Take 1 tablet by mouth 3 (three) times daily.      cephALEXin 250 MG capsule   Commonly known as: KEFLEX   Take 1 capsule (250 mg total) by mouth 4 (four) times daily.      doxycycline 100 MG tablet   Commonly known as: VIBRA-TABS   Take 1 tablet (100 mg total) by mouth 2 (two) times daily.      Fish Oil 1000 MG Caps   Take by mouth. Take 1 capsule by mouth two times a day      furosemide 80 MG tablet   Commonly known as: LASIX   Take 0.5 tablets (40 mg total) by mouth daily.      LORazepam 1 MG tablet   Commonly known as: ATIVAN   Take 1 tablet (1 mg total) by mouth 2 (two) times daily as needed for anxiety.      Omeprazole 20 MG Tbec   Take by mouth. 1 tablet once a day      OSTEO BI-FLEX ADV DOUBLE ST PO   Take by mouth. Take 2 tabs daily      polyethylene glycol packet   Commonly known as: MIRALAX / GLYCOLAX   Take 17 g by mouth daily.      pravastatin 10 MG tablet   Commonly known as: PRAVACHOL   Take 10 mg by mouth daily. Take 2 tabs daily      senna-docusate 8.6-50 MG per tablet   Commonly known as: Senokot-S   Take 2 tablets by mouth 2 (two) times daily.      sucralfate 1 G tablet   Commonly known as: CARAFATE   Take 1 g by mouth 2 (two) times daily.      SYNTHROID 175 MCG tablet   Generic drug: levothyroxine   Take 175 mcg by mouth daily.      TOPROL XL 50 MG 24 hr tablet   Generic drug: metoprolol succinate   Take 50 mg by mouth daily.      VITAMIN B-12 IJ   Inject as directed every 30 (thirty) days.      warfarin 2.5 MG tablet   Commonly known as: COUMADIN   Take 1 tablet (2.5 mg total) by mouth daily. Takes 2.5mg  (1 tablet) daily except 5mg  (  2 tablets) on Monday, Wednesday, and Friday         ASK your doctor about these medications         allopurinol 300 MG tablet   Commonly known as: ZYLOPRIM   Take 300 mg by mouth daily.           Disposition and follow-up:   [name] Graham was discharged from [facility] in improved and stable condition.  At the hospital follow up visit please address : 1. Heart rate control  2. INR follow up, will need every other day INR while on antibiotics then per normal schedule after that.  3. Will need to follow up on his Parkinson's Disease with [facility] 4. Assess his incontinence for possible Urology appointment as outpatient 5. Follow up with Surgery, Dr. Janee Morn for wound healing.   Follow-up Appointments: Follow-up Information    Follow up with Shawnie Pons, MD on 04/12/2012. (at 3:30 PM)    Contact information:   [address] [place] [address] [phone]       Follow up with [facility]. (The care coordinator will be contacting you with appointment information)    Contact information:   [facility]  [address] [place], [place]  [address] Tel: [phone]      Follow up with Liz Malady, MD in 2 weeks. (Sitz baths  3 times a day, may pack wound with wet to dry gauze between sitz baths.)    Contact information:   [address] [place] [address] [phone]         Discharge Orders    Future Appointments: Provider: Department: Dept Phone: Center:   04/12/2012 4:00 PM Herby Abraham, MD [facility] [phone] [facility]     Consultations: Neurology, General Surgery, Wound Care Orthopedic nurse  Procedures Performed:  X-ray Chest Pa And Lateral   03/18/2012  *RADIOLOGY REPORT*  Clinical Data: Shortness of breath, fever, hypertension.  CHEST - 2 VIEW  Comparison: 03/18/2012  Findings: Cardiomegaly.  No edema, focal opacity or effusion. Degenerative changes in the thoracic spine and shoulders.  IMPRESSION: Cardiomegaly.  No active disease.  Original Report Authenticated By: Cyndie Chime, M.D.   Ct Head Wo Contrast  03/18/2012  *RADIOLOGY REPORT*  Clinical Data: Weakness.  CT HEAD WITHOUT CONTRAST  Technique:  Contiguous axial images were obtained from the base of the skull through the vertex without contrast.  Comparison: 11/26/2011.  Findings: No intracranial hemorrhage.  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  No intracranial mass lesion detected on this unenhanced exam.  Global atrophy.  Ventricular prominence unchanged felt to be related to atrophy rather than hydrocephalus.  Vascular calcifications.  Complete opacification right maxillary sinus.  Mucosal thickening/partial opacification ethmoid sinus air cells.  IMPRESSION: No intracranial hemorrhage.  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  Global atrophy.  Complete opacification right  maxillary sinus.  Mucosal thickening/partial opacification ethmoid sinus air cells.  Original Report Authenticated By: Fuller Canada, M.D.   Mr Thoracic Spine Wo Contrast  03/18/2012  *RADIOLOGY REPORT*  Clinical Data:  78 year old male with weakness.  Recent bladder infection.  Comparison: CT abdomen and pelvis 08/27/2007.  MRI THORACIC SPINE  WITHOUT CONTRAST  Technique: Multiplanar and multiecho pulse sequences of the thoracic spine were obtained without intravenous contrast.  Findings:  Limited sagittal imaging of the cervical spine is remarkable for chronic lower cervical disc degeneration.  No definite significant cervical spinal stenosis.  Exaggerated thoracic kyphosis, but otherwise normal vertebral height alignment.  Normal thoracic marrow signal.  Heterogeneous increased T2 and STIR signal in lower thoracic disc (T9-T10 through T11-T12), but no reactive marrow changes, paraspinal or epidural inflammation.  Occasional benign vertebral body hemangiomas (T10).  No thoracic spinal stenosis.  Occasional small thoracic disc protrusions, and intermittent facet degeneration congruent with age. Spinal cord signal is within normal limits at all visualized levels.  Conus medullaris better demonstrated on the lumbar section below.  Negative visualized thoracic viscera.  Upper abdominal viscera remarkable for multiple large renal cysts, more so on the left.  IMPRESSION: 1.  No acute or significant findings in the thoracic spine.  Age congruent degenerative changes. 2.  Lumbar findings are below.  MRI LUMBAR SPINE WITHOUT CONTRAST  Technique: Multiplanar and multiecho pulse sequences of the lumbar spine were obtained without intravenous contrast.  Findings:  Transitional lumbosacral anatomy when utilizing the thoracic numbering system which is correlated to the cervical spine.  The S1 level with lumbarized. Correlation with radiographs is recommended prior to any operative intervention.  Mild grade 1 anterolisthesis  of L5 on S1 (4 mm) associated with disc and facet degeneration detailed below.  Otherwise normal lumbar vertebral height and alignment. No marrow edema or evidence of acute osseous abnormality.  Negative visualized sacrum.  Multiple large to a very large bilateral renal cystic lesions, visualized portions are stable from the 2009 comparison.  No retroperitoneal lymphadenopathy.  Posterior paraspinal soft tissues are within normal limits.   Visualized lower thoracic spinal cord is normal with conus medularis at L1-L2.  T12-L1:  Negative.  L1-L2:  Negative.  L2-L3:  Minimal disc bulge.  Moderate facet hypertrophy.  No significant stenosis.  L3-L4:  Mild disc bulge.  Moderate facet hypertrophy.  No significant stenosis.  L4-L5:  Moderate spinal stenosis related to circumferential mild disc bulge and moderate to severe facet and ligament flavum hypertrophy.  Mild bilateral lateral recess and left greater than right L4 foraminal stenosis.  L5-S1:  Grade 1 spondylolisthesis.  Broad-based disc/pseudo disc bulge.  Severe facet degeneration and hypertrophy.  Moderate ligament flavum hypertrophy.  Mild to moderate spinal stenosis results.  Mild left greater than right lateral recess stenosis. Mild to moderate L5 foraminal stenosis.  S1-S2:  Full size disc space.  Negative disc.  Moderate facet hypertrophy.  No significant stenosis.  IMPRESSION: 1.  Transitional lumbosacral anatomy, lumbarized S1 level and full size S1-S2 disc. Correlation with radiographs is recommended prior to any operative intervention. 2.  Moderate multifactorial spinal stenosis at L4-L5 with mild lateral recess and left greater than right L4 foraminal stenosis. 3.  Mild to moderate multifactorial spinal stenosis at L5-S1 in the setting of spondylolisthesis and advanced facet degeneration.  Mild lateral recess and mild to moderate bilateral L5 foraminal stenosis. 4.  Chronic multicystic renal disease, grossly stable since 2009.  Original Report  Authenticated By: Harley Hallmark, M.D.   Dg Chest Portable 1 View  03/18/2012  *RADIOLOGY REPORT*  Clinical Data: Weakness.  Fever.  PORTABLE CHEST - 1 VIEW  Comparison: 01/21/2012  Findings: 1039 hours on The cardiopericardial silhouette is enlarged. Interstitial markings are diffusely coarsened with chronic features. There is pulmonary vascular congestion without overt pulmonary edema.  No focal  airspace consolidation or overt airspace pulmonary edema. Imaged bony structures of the thorax are intact. Telemetry leads overlie the chest.  IMPRESSION: Cardiomegaly with vascular congestion superimposed on chronic underlying interstitial changes.  Original Report Authenticated By: ERIC A. MANSELL, M.D.   Admission HPI: Mr. Gettel is an 78 yo man with PMH of Parkinson disease, atrial fibrillation and bilateral PE on Warfarin, dementia, hypothyroidism, HTN presenting to the ED for bilateral leg weakness. He says he was not able to to move starting about 2 days prior to admission. This has been a gradual process for him; as long as 6 months ago he started feeling more weak in his legs. This has gradually worsened over time to the point that he has been bed-bound for the past few weeks, only getting up to use the bathroom. He says that nothing in particular triggered increased weakness over this time, and resting has alleviated it. He denies any pain. No loss of sensation or increased weakness anywhere besides his legs. No other associated factors. He has a 3 year history of PD, and says that his symptoms have been the same. He does not take medications for this. He has a history of intermittent TIA with L. Sided weakness that was diagnosed 3-4 months ago. He says he has episodes of left sided weakness a "few times" a week which lasts about 2 hours. No aggravating or alleviating factors were identified for this condition.  He also complains of an infection on his left gluteus that he first noticed one week prior to  admission. This was drained by the ED physician when he came to the ED. He states that he's been having a fever for the past 3-4 days that his daughter (his caretaker) has measured. His temperature at home on day of admission was ~100. He has also recently been treated for what sounds like a UTI x 1 week with ciprofloxacin which he is still taking. He says that he has lost control of his urination. No dysuria, his bladder does not feel empty after voiding. He says he does not feel this urine coming and that he soils himself regularly. This has been a problem for "weeks" according to the patient. No loss of control of BMs, he has to take a daily laxative (milk of Mg) in order to have a BM.  He finally has an extensive cardiac history with severe aortic stenosis and atrial fibrillation x 20 years on coumadin. He feels chest pain intermittently when he is "emotionally aggravated," which is about one time a week. He had one episode of syncope in the past year was while he was here in the at [facility] about a month and a half ago when his wife received surgery for an aortic valve replacement. He briefly lost consciousness during this episode and felt mildly confused upon waking up but immediately regained his senses. He denies SOB.   Hospital Course by problem list: 1. Weakness of both legs: On admission he had bilateral weakness of his legs as well as mild altered mental status. He had progressive weakness that had been getting slowly worse over the last 4-6 months but started getting worse in the past week, concomitantly with his UTI. In addition, there was a "drop-off" in his strength and mental status came 1-2 days before admission after learning that his wife was getting another surgery. Deconditioning 2/2 to decreased activity and staying with his wife in the hospital also likely played a role in his weakness along with worsening Parkinsonian  symptoms. Knee and hip strength improved from 3/5 to 5/5 the morning after  admission. PT/OT and Neurology were consulted and both recommended inpatient rehabilitation.  The family decided to have him moved to a SNF in [place] for rehab with the plans for his wife to join him when she was released from the hospital.   2. Parkinson's Disease: According to his daughter, approximately a year ago the patient had an episode of agitation and violence, went to a health care facility and was placed on an anti-psychotic agent for presumed agitation 2/2 schizophrenia. This likely worsened his parkinson's. ON admission the antipsychotic Hinda Glatter) was discontinued; a neurology consult was called and started low dose Sinemet and gradually titrated up the dosage without any adverse effects. The family was provided with reading material for patients regarding caring for patients with Parkinson's. He will be getting close neuro follow-up with [facility] for managing his PD along with consideration for Deep Brain Stimulation.  Per neurologies recommendations, if he develops hallucinations from the Sinemet that he should be started on Seroquel 12.5 mg qhs and followed up with them closely.    3. Urinary Incontinence:  Patient has urinary incontinence symptoms 1 week prior to admission and was treated with Cipro as outpatient which helped to alleviate his incontinence but started to worsen 2 days prior to admission. This was thought to be multifactorial with overflow incontinence 2/2 BPH vs a inability to get to a bathroom before urination vs. Dysautonomia in Parkinson's Disease all being considered.  Post void residual was done and shown to be 22 making obstruction and retention less likely.  He was maintained with a condom cath to help protect his skin during his stay.  He should be watch closely as his rehab progresses and consideration given to outpatient urology follow up for incontinence if it continues.   4.  Urinary tract infection:  On 8/22 he began complaining of some dysuria and UA  was done that showed a definite UTI. Urine culture growing E. Coli sensitive to Bactrim as well as Cephalosporins.  He was initially started on Vancomycin and Zosyn for broad coverage and was narrowed to Bactrim with cultures.  Pharmacy suggested changing to Keflex for the urinary tract infection to limit the interaction with his coumadin.  He will need a total of 7 days of treatment with his last dose of the Keflex given on 03/31/12.   5. Gluteal abscess: On admission a small gluteal abscess was noted and initially I&D in the ED.  ON the evening of 8/21 the patient spiked mild fever and then a definite fever on 8/22 with Tmax of 101.5. The abscess was draining more pus with some surrounding erythema. Surgery evaluated patient and did a bedside I&D And packed the wound. Vancomycin and Zosyn were started for broad coverage given the location as well as his incontinence. Wound culture gram stain showed MRSA sensitive to bactrim and tetracycline. Packing removed by Surgery and patient was start sitz bathes three times daily then apply dry dressing after each bath.  If patient unable to do sitz bathes then would go back to dressing changes daily with packing. He was switched from Vanc and Zosyn to Bactrim on 03/27/12 and will be discharged on Doxycycline to limit the interaction with is coumadin.  He  will need 10 more days of doxycycline with last dose on to be given on 04/06/12.  6. AORTIC STENOSIS: Mr. Vandevoort has not had SOB and only has chest pain in stressful situations.  His only syncopal episode in the past year was over a month ago while his wife was in the hospital. Per Dr. Rosalyn Charters records, Mr. Goedken is a poor candidate for TAVR. He had no complications 2/2 his aortic stenosis during this stay.  His metoprolol dose was decreased due to hypotension initially and we titrated up his Metoprolol to 50mg  QD (home dose) as his HR trended upward.  7. Atrial fibrillation on Chronic coumadin therapy: His metoprolol dose  was decreased due to soft blood pressures. On the night of admission (8/16), the patient had a run of vtach x 8 beats with no complications. While working with PT on 8/24 his heart rate rose to the 160s. He was asymptomatic and it returned to baseline when back in bed. Orthostatics were negative.  We resumed his home dose of metoprolol 50mg  qd and continued Warfarin. INR is in therapeutic range but since patient will be on Bactrim for his UTI and gluteal abscess, SNF will need to do every other day INR for monitor while the patient is on coumadin.   8. HYPERTENSION, BENIGN: bps on admission were low (100s/50s); his Captopril and Lasix were held, and his Metoprolol was decreased to 12.5mg  qd on admission. His pressures remained stable throughout hospital course in average SBP 120-130's, and he was restarted on his metoprolol 50 mg along with Lasix 40 mg PO QDay on 03/19/12. We did not resume his Captopril since his BPs were relatively well controlled; therefore, will defer to Dr. [name] if he would like to resume Captopril at next office visit in 04/2012.     9. Constipation: Mr. Brass has a history of needing laxative in order to have bowel movements at home.  Patient  had a bowel movements with Miralax and senna-kot daily.     10. Hypothyroidism: stable, TSH was 1.557, WNL. We continued home dose of levothyroxine qAM  Discharge Vitals:  BP 115/77  Pulse 86  Temp 98.3 F (36.8 C) (Oral)  Resp 16  Ht 6\' 2"  (1.88 m)  Wt 279 lb (126.554 kg)  BMI 35.82 kg/m2  SpO2 99%  Discharge Labs:  Results for orders placed during the hospital encounter of 03/18/12 (from the past 24 hour(s))  CLOSTRIDIUM DIFFICILE BY PCR     Status: Normal   Collection Time   03/27/12  7:07 PM      Component Value Range   C difficile by pcr NEGATIVE  NEGATIVE  PROTIME-INR     Status: Abnormal   Collection Time   03/28/12  5:43 AM      Component Value Range   Prothrombin Time 31.2 (*) 11.6 - 15.2 seconds   INR 2.95  (*) 0.00 - 1.49   Signed: Eshan Trupiano Internal Medicine Resident Pager: [phone] 03/29/2012 1:53 PM   Time Spent on Discharge: >60 minutes

## 2012-03-29 DIAGNOSIS — L0231 Cutaneous abscess of buttock: Secondary | ICD-10-CM | POA: Diagnosis present

## 2012-03-29 LAB — CBC WITH DIFFERENTIAL/PLATELET
Basophils Absolute: 0.1 10*3/uL (ref 0.0–0.1)
Basophils Relative: 2 % — ABNORMAL HIGH (ref 0–1)
Eosinophils Absolute: 0.6 10*3/uL (ref 0.0–0.7)
MCHC: 33.8 g/dL (ref 30.0–36.0)
Neutro Abs: 2.6 10*3/uL (ref 1.7–7.7)
Neutrophils Relative %: 43 % (ref 43–77)
RDW: 14 % (ref 11.5–15.5)

## 2012-03-29 LAB — BASIC METABOLIC PANEL
BUN: 16 mg/dL (ref 6–23)
Calcium: 8.7 mg/dL (ref 8.4–10.5)
Creatinine, Ser: 0.97 mg/dL (ref 0.50–1.35)
GFR calc Af Amer: 88 mL/min — ABNORMAL LOW (ref >90)
GFR calc non Af Amer: 76 mL/min — ABNORMAL LOW (ref >90)

## 2012-03-29 LAB — PROTIME-INR: Prothrombin Time: 27.3 seconds — ABNORMAL HIGH (ref 11.6–15.2)

## 2012-03-29 MED ORDER — CEPHALEXIN 250 MG PO CAPS: 250.0000 mg | ORAL_CAPSULE | Freq: Four times a day (QID) | ORAL | Status: AC

## 2012-03-29 MED ORDER — SENNOSIDES-DOCUSATE SODIUM 8.6-50 MG PO TABS: 2.0000 | ORAL_TABLET | Freq: Two times a day (BID) | ORAL | Status: DC

## 2012-03-29 MED ORDER — DOXYCYCLINE HYCLATE 100 MG PO TABS: 100.0000 mg | ORAL_TABLET | Freq: Two times a day (BID) | ORAL | Status: AC

## 2012-03-29 MED ORDER — FUROSEMIDE 80 MG PO TABS: 40.0000 mg | ORAL_TABLET | Freq: Every day | ORAL | Status: DC

## 2012-03-29 MED ORDER — POLYETHYLENE GLYCOL 3350 17 G PO PACK: 17.0000 g | PACK | Freq: Every day | ORAL | Status: AC

## 2012-03-29 MED ORDER — CARBIDOPA-LEVODOPA ER 50-200 MG PO TBCR: 1.0000 | EXTENDED_RELEASE_TABLET | Freq: Three times a day (TID) | ORAL | Status: AC

## 2012-03-29 MED ORDER — LORAZEPAM 1 MG PO TABS: 1.0000 mg | ORAL_TABLET | Freq: Two times a day (BID) | ORAL | Status: AC | PRN

## 2012-03-29 MED ORDER — WARFARIN SODIUM 2.5 MG PO TABS: 2.5000 mg | ORAL_TABLET | Freq: Every day | ORAL | Status: DC

## 2012-03-29 NOTE — Progress Notes (Signed)
Patient discharged to [facility] via ambulance. Report called to nurse at [facility]. Patient left unit in stable condition.

## 2012-03-29 NOTE — Progress Notes (Signed)
PHARMACY CONSULT NOTE - Follow Up Consult  Pharmacy Consult for warfarin,  Indication: atrial fibrillation, hx B PE Allergies  Allergen Reactions  . Morphine And Related     Goes Crazy  . Nifedipine Other (See Comments)    "makes me go crazy"  . Nitroglycerin     Patient Measurements: Height: 6\' 2"  (188 cm) Weight: 279 lb (126.554 kg) IBW/kg (Calculated) : 82.2   Vital Signs: Temp: 98 F (36.7 C) (08/27 0546) BP: 149/101 mmHg (08/27 0546) Pulse Rate: 71  (08/27 0546)  Labs:  Alvira Philips 03/29/12 0509 03/28/12 0543 03/27/12 0515 03/26/12 1240  HGB 11.6* -- -- --  HCT 34.3* -- -- --  PLT 190 -- -- --  APTT -- -- -- --  LABPROT 27.3* 31.2* 32.4* --  INR 2.49* 2.95* 3.10* --  HEPARINUNFRC -- -- -- --  CREATININE 0.97 -- -- 0.99  CKTOTAL -- -- -- --  CKMB -- -- -- --  TROPONINI -- -- -- --    Estimated Creatinine Clearance: 85.9 ml/min (by C-G formula based on Cr of 0.97).   Assessment: 78 y/o male with afib on Coumadin PTA  2.5 mg TTSS and 5 mg MWF - verified with pt and daughter.  INR jumped to supratherapeutic level previously and is therapeutic at  2.49 today.  Coumadin 2 mg dose ordered yesterday was not given..   No bleeding noted. He was started on septra 8/25 for MRSA wound infection and E coli UTI. This will increase his INR.  MD aware and following closely.  Goal of Therapy:  INR 2-3   Plan:  - continue Coumadin 2 mg po daily - INR may drop a little 2nd coumadin dose was not given yesterday - watch INR closely as septra drug interaction will increase INR. Herby Abraham, Pharm.D. [phone] 03/29/2012 9:58 AM

## 2012-03-29 NOTE — Progress Notes (Signed)
Subjective: Doing well today.  Smiling and more interactive then he has been before to me today.  Denies pain, SOB, nausea, or diarrhea.  He has been having soft stools with the bowel regimen that we have him on.   Objective: Vital signs in last 24 hours: Filed Vitals:   03/27/12 2057 03/28/12 1300 03/28/12 2123 03/29/12 0546  BP: 115/77 104/80 115/66 149/101  Pulse: 86 107 90 71  Temp: 98.3 F (36.8 C) 98.7 F (37.1 C) 97.6 F (36.4 C) 98 F (36.7 C)  TempSrc: Oral     Resp: 16 18 18 18   Height:      Weight:      SpO2: 99% 95% 95% 95%   Weight change:   Intake/Output Summary (Last 24 hours) at 03/29/12 1240 Last data filed at 03/29/12 1155  Gross per 24 hour  Intake      0 ml  Output   1400 ml  Net  -1400 ml   Vitals reviewed. General: resting in bed, NAD  HEENT: PERRL, EOMI, no scleral icterus  Cardiac: irregularly irregular rate and rhythm, 3/6 systolic ejection murmur best heard in the LUSB with radiation to the carotids. no rubs, or gallops  Pulm: mild inspiratory crackles in the bases bilaterally. no wheezes, rales, or rhonchi  Abd: soft, nontender, nondistended, BS present  Ext: warm and well perfused, no pedal edema, bilateral boots present. Bandage in place over left gluteal wound with mild drainage noted today, bandage removed, wound is 2x2 with good granulation tissue and no undermining.  no erythema surrounding today. Dressing changed Neuro: masked facies though more interactive today, alert and oriented X3, cranial nerves II-XII grossly intact, strength is 5/5 in the bilateral upper and lower extremities.  Sensation to light touch equal in bilateral upper and lower extremities  Lab Results: Basic Metabolic Panel:  Lab 03/29/12 1610 03/26/12 1240  NA 141 136  K 3.8 3.4*  CL 106 102  CO2 25 25  GLUCOSE 88 114*  BUN 16 20  CREATININE 0.97 0.99  CALCIUM 8.7 8.7  MG -- 1.9  PHOS -- 3.1   CBC:  Lab 03/29/12 0509 03/25/12 1135  WBC 6.1 8.1  NEUTROABS 2.6  --  HGB 11.6* 12.6*  HCT 34.3* 37.6*  MCV 96.1 97.9  PLT 190 199   Coagulation:  Lab 03/29/12 0509 03/28/12 0543 03/27/12 0515 03/26/12 0610  LABPROT 27.3* 31.2* 32.4* 35.7*  INR 2.49* 2.95* 3.10* 3.51*   Urinalysis:  Lab 03/24/12 1448  COLORURINE YELLOW  LABSPEC 1.016  PHURINE 5.5  GLUCOSEU NEGATIVE  HGBUR LARGE*  BILIRUBINUR NEGATIVE  KETONESUR NEGATIVE  PROTEINUR NEGATIVE  UROBILINOGEN 1.0  NITRITE POSITIVE*  LEUKOCYTESUR LARGE*   Micro Results: Recent Results (from the past 240 hour(s))  WOUND CULTURE     Status: Normal   Collection Time   03/24/12 12:54 PM      Component Value Range Status Comment   Specimen Description WOUND LEFT BUTTOCKS   Final    Special Requests NONE   Final    Gram Stain     Final    Value: ABUNDANT WBC PRESENT,BOTH PMN AND MONONUCLEAR     NO SQUAMOUS EPITHELIAL CELLS SEEN     FEW GRAM POSITIVE COCCI     IN PAIRS IN CLUSTERS   Culture     Final    Value: MODERATE METHICILLIN RESISTANT STAPHYLOCOCCUS AUREUS     Note: RIFAMPIN AND GENTAMICIN SHOULD NOT BE USED AS SINGLE DRUGS FOR TREATMENT OF STAPH INFECTIONS.  This organism DOES NOT demonstrate inducible Clindamycin resistance in vitro. CRITICAL RESULT CALLED TO, READ BACK BY AND VERIFIED WITH: ALEX COLE@1049  ON      [id number] BY [facility]   Report Status 03/27/2012 FINAL   Final    Organism ID, Bacteria METHICILLIN RESISTANT STAPHYLOCOCCUS AUREUS   Final   URINE CULTURE     Status: Normal   Collection Time   03/24/12  2:48 PM      Component Value Range Status Comment   Specimen Description URINE, CLEAN CATCH   Final    Special Requests NONE   Final    Culture  Setup Time 03/25/2012 02:29   Final    Colony Count >=100,000 COLONIES/ML   Final    Culture ESCHERICHIA COLI   Final    Report Status 03/26/2012 FINAL   Final    Organism ID, Bacteria ESCHERICHIA COLI   Final   CLOSTRIDIUM DIFFICILE BY PCR     Status: Normal   Collection Time   03/27/12  7:07 PM      Component Value Range Status  Comment   C difficile by pcr NEGATIVE  NEGATIVE Final    Studies/Results: No results found. Medications: I have reviewed the patient's current medications. Scheduled Meds:   . allopurinol  300 mg Oral Daily  . aspirin  81 mg Oral Daily  . carbidopa-levodopa  1 tablet Oral TID  . Chlorhexidine Gluconate Cloth  6 each Topical Q0600  . furosemide  40 mg Oral Daily  . levothyroxine  175 mcg Oral Daily  . metoprolol succinate  50 mg Oral Daily  . mupirocin ointment  1 application Nasal BID  . omega-3 acid ethyl esters  1 g Oral BID  . pantoprazole  40 mg Oral Q1200  . polyethylene glycol  17 g Oral Daily  . senna-docusate  2 tablet Oral BID  . simvastatin  10 mg Oral q1800  . sucralfate  1 g Oral BID  . sulfamethoxazole-trimethoprim  1 tablet Oral Q12H  . warfarin  2 mg Oral q1800  . Warfarin - Pharmacist Dosing Inpatient   Does not apply q1800   Continuous Infusions:  PRN Meds:.Glycerin (Adult), LORazepam  Assessment/Plan: 1. Weakness of both legs: On admission he has bilateral weakness of his legs as well as some mild altered mental status. His daughter is his primary caregiver and she is present today. He has had progressive weakness that has been getting slowly worse over the last few months but really started getting worse in the past week, concomitantly with his UTI. In addition, the "drop-off" in his strength and mental status came after learning that his wife was getting another surgery. Deconditioning 2/2 to decreased activity and staying with his wife in the hospital is likely playing a role in his weakness along with worsening Parkinsonian symptoms. Weakness continues to improve. He has tolerated the increase of his Sinamet to TID without side effects. PT and OT continued to suggest Inpatient Rehab and rehab had accepted him but family on 8/21 had decided that they would like him to go to a SNF in [place] because his wife would be going there when she is discharged. He continues  to improve daily and is working with PT.  - Appreciate neurology recommendations with the medications. We will work to have them follow up as an outpatient.  - PT/OT recs include inpatient rehab  - Plan to D/C to SNF in [place] today.   2. Parkinson's Disease: According to his daughter, the patient  had an episode of agitation and violence, went to a health care facility and was placed on an anti-psychotic agent for presumed agitation 2/2 schizophrenia. This has almost certainly worsened his parkinson's. He has not had any hallucinations since starting on the Sinamet.  - Appreciate neuro recs regarding medications, if he has hallucinations the plans are to add Seroquel at very low doses.  -Continue Sinemet  - D/C [facility]  - Outpatient follow up with Neurology here in [place].   3. UTI with Urinary Incontinence: overflow incontinence 2/2 BPH vs an inability to get to a bathroom before urination vs. Relation to his parkinson's progression. He has had some problems with dysuria and UA showed a definite UTI. Urine culture growing E. Coli sensitive to Bactrim and Keflex.  -continue Condom cath in place to protect skin.  - Change to Keflex with plans to treat for 7 days total for the UTI.   4. Gluteal abscess: mild fever on the evening of 8/21 and then a definite fever on 8/22 with Tmax of 101.5. Abscess was draining more pus with some surrounding erythema. Surgery saw and did a bedside I&D And packed the wound. Vanc and Zosyn were started for broad coverage given the location as well as his incontinence. Wound culture gram stain showed MRSA sensitive to bactrim and tetracyclines. Packing removed by Surgery with plans for Sitz baths TID with dressing after.  Per their note if he is unable to do Sitz baths we will do daily dressing changes with packing in place.  - Doxycycline with last dose given on 04/06/2012 for a total of 14 days of therapy.   5. AORTIC STENOSIS/ INSUFFICIENCY: Per Dr. Rosalyn Charters  records, Mr. Leverich is a poor candidate for TAVR. His metoprolol dose was decreased due to hypotension and has been controlling his rate as well as his blood pressure. He may need increased dose as he gets to be more active again but currently is still mildly hypotensive when lying in bed.  - Continue metoprolol and Lasix.   6. Atrial fibrillation: Pt has been in atrial fibrillation during this hospitalization. INR is therapeutic. HR 110's today, we had resumed his metoprolol. While working with PT on 8/24 his heart rate rose to the 160s. He was asymptomatic and it returned to baseline when back in bed. Orthostatics were negative. Plan today to increase his metoprolol to his home dose.  - Continue warfarin regimen   7. HYPERTENSION, BENIGN: stable BP 132/72  -increase metoprolol to 100 mg PO QDay. HR is slightly up today to 110's.   8. FEN/GI: Will continue senna with enema PRN. Will also give a glycerin suppository PRN as well.  - Continue current bowel regimen  -Continue PPI   9. Dispo: Plan for D/C to the SNF in [place] today with Keflex for his UTI and Doxycycline for his gluteal wound.  He will need INR checks every other day until he is off his antibiotics to adjust his coumadin dose.    LOS: 11 days   Sacheen Arrasmith 03/29/2012, 12:40 PM

## 2012-03-29 NOTE — Progress Notes (Signed)
Clinical Social Work-CSW facilitated pt d/c to [facility] with chart copy/PTAR transport and insurance authorization-Pt family notified of transport called-No further needs at this time- Jodean Lima, [phone]

## 2012-03-29 NOTE — Progress Notes (Signed)
Physical Therapy Treatment Patient Details Name: [name] Graham MRN: [id number] DOB: 04/15/1929 Today's Date: 03/29/2012 Time: 4034-7425 PT Time Calculation (min): 32 min  PT Assessment / Plan / Recommendation Comments on Treatment Session  Pt demonstrates improvements in activity today compared to previous session.  Pt was able to control rw without manual assist and did not have anny retropulsion issues today.     Follow Up Recommendations  Inpatient Rehab;Supervision/Assistance - 24 hour    Barriers to Discharge        Equipment Recommendations  Defer to next venue    Recommendations for Other Services    Frequency Min 4X/week   Plan Discharge plan remains appropriate;Frequency remains appropriate    Precautions / Restrictions Precautions Precautions: Fall Restrictions Weight Bearing Restrictions: No   Pertinent Vitals/Pain 0/10 reported     Mobility  Bed Mobility Bed Mobility: Supine to Sit;Sitting - Scoot to Edge of Bed Rolling Right: 3: Mod assist Supine to Sit: 3: Mod assist Supine to Sit: Patient Percentage: 70% Sitting - Scoot to Edge of Bed: 3: Mod assist Details for Bed Mobility Assistance: Pt req UE assist for trunk and hip rotation Transfers Transfers: Sit to Stand;Stand to Sit Sit to Stand: 3: Mod assist;From bed Sit to Stand: Patient Percentage: 70% Stand to Sit: 4: Min guard;To chair/3-in-1;With armrests Ambulation/Gait Ambulation/Gait Assistance: 4: Min assist Ambulation/Gait: Patient Percentage: 90% Ambulation Distance (Feet): 200 Feet Assistive device: Rolling walker Ambulation/Gait Assistance Details: Pt able to ambulate further with less assist required today Gait Pattern: Decreased stride length;Trunk flexed;Decreased trunk rotation;Festinating;Step-through pattern Gait velocity: decreased    Exercises General Exercises - Lower Extremity Ankle Circles/Pumps: AROM;Both;20 reps Long Arc Quad: AROM;Both;10 reps Hip ABduction/ADduction:  AROM;Strengthening;Both;10 reps Hip Flexion/Marching: AROM;Strengthening;Both;10 reps     PT Goals Acute Rehab PT Goals PT Goal Formulation: With patient PT Goal: Supine/Side to Sit - Progress: Progressing toward goal PT Goal: Sit to Supine/Side - Progress: Progressing toward goal PT Goal: Sit to Stand - Progress: Progressing toward goal PT Goal: Stand to Sit - Progress: Progressing toward goal PT Goal: Ambulate - Progress: Progressing toward goal  Visit Information  Last PT Received On: 03/29/12 Assistance Needed: +2    Subjective Data  Subjective: Im feeling better today Patient Stated Goal: to get up and walk   Cognition  Overall Cognitive Status: Appears within functional limits for tasks assessed/performed Arousal/Alertness: Awake/alert Orientation Level: Appears intact for tasks assessed Behavior During Session: [facility] for tasks performed    Balance     End of Session PT - End of Session Equipment Utilized During Treatment: Gait belt Activity Tolerance: Patient tolerated treatment well Patient left: in chair;with call bell/phone within reach Nurse Communication: Mobility status   GP     Fabio Asa 03/29/2012, 3:05 PM Charlotte Crumb, PT DPT  [phone]

## 2012-03-30 LAB — URINE CULTURE

## 2012-03-31 NOTE — Progress Notes (Signed)
Utilization review completed. Crandall Harvel, RN, BSN. 

## 2012-04-12 ENCOUNTER — Encounter: Payer: Self-pay | Admitting: Cardiology

## 2012-04-12 ENCOUNTER — Ambulatory Visit (INDEPENDENT_AMBULATORY_CARE_PROVIDER_SITE_OTHER): Payer: Medicare Other | Admitting: Cardiology

## 2012-04-12 ENCOUNTER — Ambulatory Visit: Payer: Medicare Other | Admitting: Cardiology

## 2012-04-12 VITALS — BP 100/62 | HR 82 | Ht 72.0 in | Wt 261.1 lb

## 2012-04-12 DIAGNOSIS — I4891 Unspecified atrial fibrillation: Secondary | ICD-10-CM

## 2012-04-12 DIAGNOSIS — I359 Nonrheumatic aortic valve disorder, unspecified: Secondary | ICD-10-CM

## 2012-04-12 DIAGNOSIS — I10 Essential (primary) hypertension: Secondary | ICD-10-CM

## 2012-04-12 DIAGNOSIS — Z86711 Personal history of pulmonary embolism: Secondary | ICD-10-CM

## 2012-04-12 DIAGNOSIS — I350 Nonrheumatic aortic (valve) stenosis: Secondary | ICD-10-CM

## 2012-04-12 MED ORDER — FUROSEMIDE 80 MG PO TABS: 80.0000 mg | ORAL_TABLET | Freq: Every day | ORAL | Status: AC

## 2012-04-12 NOTE — Patient Instructions (Addendum)
Your physician recommends that you schedule a follow-up appointment in:  2 MONTHS WITH DR STUCKEY  Your physician recommends that you continue on your current medications as directed. Please refer to the Current Medication list given to you today. 

## 2012-04-13 ENCOUNTER — Encounter (INDEPENDENT_AMBULATORY_CARE_PROVIDER_SITE_OTHER): Payer: Self-pay | Admitting: General Surgery

## 2012-04-13 ENCOUNTER — Ambulatory Visit (INDEPENDENT_AMBULATORY_CARE_PROVIDER_SITE_OTHER): Payer: Medicare Other | Admitting: General Surgery

## 2012-04-13 VITALS — BP 130/86 | HR 82 | Temp 97.6°F | Resp 14 | Ht 72.0 in | Wt 261.0 lb

## 2012-04-13 DIAGNOSIS — L0231 Cutaneous abscess of buttock: Secondary | ICD-10-CM

## 2012-04-13 NOTE — Progress Notes (Signed)
Subjective:     Patient ID: [name] Graham, male   DOB: 23-Apr-1929, 78 y.o.   MRN: [id number]  HPI Patient presents for F/U of L buttock abscess which was I and D'd in the hospital.He is currently in skilled nursing facility. The area is feeling much better.  Review of Systems     Objective:   Physical Exam  Cardiovascular: Normal rate and normal heart sounds.   Pulmonary/Chest: Effort normal and breath sounds normal.  Abdominal: Soft. He exhibits no distension. There is no tenderness.  Left buttock wound is about 1 cm in size. Granulation tissue is flat with the skin surface. No ongoing signs of infection. No drainage. Gauze dressing applied.     Assessment:     Left buttock abscess much improved    Plan:     Plain gauze dressing daily until closed. Followup when necessary.

## 2012-04-17 NOTE — Assessment & Plan Note (Signed)
He is not a good candidate for a percutaneous valve at present.  He might be a candidate for palliative ABV, but will continue to monitor him closely.  Overall functional capacity is poor.

## 2012-04-17 NOTE — Assessment & Plan Note (Signed)
Very high risk.  Currently on warfarin.

## 2012-04-17 NOTE — Assessment & Plan Note (Addendum)
Controlled. The family tells me that regular labs are being done at the rehab facility.

## 2012-04-17 NOTE — Assessment & Plan Note (Signed)
Rate is controlled 

## 2012-04-17 NOTE — Progress Notes (Signed)
HPI:  The patient seems to be doing better after his recent hospitalization. He is currently in a rehabilitation facility, and they're working him out. This seems to be beneficial. He actually thinks his overall exercise tolerance has improved. Patient has multiple medical problems, and his wife, his family, and I all have concerns about his ability to undergo any type of aortic valve procedure. He's not having angina at the present time, and with rehabilitation, his shortness of breath is improved. He's not been in pulmonary edema.  The patient also has progressive Parkinson's, and had a left gluteal abscess which is been followed in the surgery clinic. His followup visit, this was improved.  Current Outpatient Prescriptions  Medication Sig Dispense Refill  . acetaminophen (ARTHRITIS PAIN RELIEF) 650 MG CR tablet Take 650 mg by mouth. Take 2 tabs AM and 1 tab PM       . allopurinol (ZYLOPRIM) 300 MG tablet Take 300 mg by mouth daily.        Marland Kitchen aspirin 81 MG chewable tablet Chew 81 mg by mouth daily.      . carbidopa-levodopa (SINEMET CR) 50-200 MG per tablet Take 1 tablet by mouth 3 (three) times daily.  90 tablet  0  . Cyanocobalamin (VITAMIN B-12 IJ) Inject as directed every 30 (thirty) days.      . furosemide (LASIX) 80 MG tablet Take 1 tablet (80 mg total) by mouth daily.  30 tablet  1  . levothyroxine (SYNTHROID) 175 MCG tablet Take 175 mcg by mouth daily.        Marland Kitchen LORazepam (ATIVAN) 1 MG tablet Take 1 tablet (1 mg total) by mouth 2 (two) times daily as needed for anxiety.  30 tablet  0  . metoprolol (TOPROL XL) 50 MG 24 hr tablet Take 50 mg by mouth daily.        . Misc Natural Products (OSTEO BI-FLEX ADV DOUBLE ST PO) Take by mouth. Take 2 tabs daily       . Omega-3 Fatty Acids (FISH OIL) 1000 MG CAPS Take by mouth. Take 1 capsule by mouth two times a day       . Omeprazole 20 MG TBEC Take by mouth. 1 tablet once a day       . rosuvastatin (CRESTOR) 10 MG tablet Take 10 mg by mouth daily.       . sucralfate (CARAFATE) 1 G tablet Take 1 g by mouth 2 (two) times daily.        Marland Kitchen warfarin (COUMADIN) 2.5 MG tablet Take 1 tablet (2.5 mg total) by mouth daily. Takes 2.5mg  (1 tablet) daily except 5mg  (2 tablets) on Monday, Wednesday, and Friday  30 tablet  0    Allergies  Allergen Reactions  . Morphine And Related     Goes Crazy  . Nifedipine Other (See Comments)    "makes me go crazy"  . Nitroglycerin     Past Medical History  Diagnosis Date  . Pulmonary embolus     bilateral  . Hypothyroid   . Hypertension   . CAD (coronary artery disease)   . Chronic atrial fibrillation     INR therpeutic on Coumadin  . Dementia     hallucinations status post psychiatric evaluation during admission  . Kidney cyst, acquired   . A-fib   . CAD (coronary artery disease)     cath 2006, no stents, ejection fraction  . Leukocytosis   . Tremor   . Parkinson disease   . Severe  aortic stenosis     Past Surgical History  Procedure Date  . Mole removakl     Family History  Problem Relation Age of Onset  . Heart failure Mother   . Microcephaly Father     History   Social History  . Marital Status: Married    Spouse Name: N/A    Number of Children: N/A  . Years of Education: 12   Occupational History  .     Social History Main Topics  . Smoking status: Former Smoker -- 2.0 packs/day for 12 years    Types: Cigarettes  . Smokeless tobacco: Not on file  . Alcohol Use: No  . Drug Use: No  . Sexually Active: Not on file   Other Topics Concern  . Not on file   Social History Narrative   PCP is Renae Fickle, Cardiologist is Dr. [name]. He receives Medicare. Power of [name] is his son: Egon Dittus - [phone]. Caretaker is [name]: [phone].Originally from [place], completed high school. 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 He currently lives with his wife in [place]. For the past 65 months, he had a home  health nurse caring for him. His daughter has been living with he and his wife since his wife had a valve replacement in 6/13.Has not smoked in 40 years.     ROS: Please see the HPI.  All other systems reviewed and negative.  PHYSICAL EXAM:  BP 100/62  Pulse 82  Ht 6' (1.829 m)  Wt 261 lb 1.9 oz (118.443 kg)  BMI 35.41 kg/m2  SpO2 98%  General: Well developed, well nourished, in no acute distress. Head:  Normocephalic and atraumatic. Neck: no JVD Lungs: Clear to auscultation and percussion.  Slight decrease at the bases Heart: irregularly irregular with SEM 3/6.  No DM.   Abdomen:  Normal bowel sounds; soft; non tender; no organomegaly Neurologic: Alert and oriented x 3.  Pill rolling tremor and other findings of Parkinsons.    EKG:  Recent tracing from hospital.    ASSESSMENT AND PLAN:

## 2012-06-13 ENCOUNTER — Encounter: Payer: Self-pay | Admitting: Cardiology

## 2012-06-13 ENCOUNTER — Ambulatory Visit (INDEPENDENT_AMBULATORY_CARE_PROVIDER_SITE_OTHER): Payer: Medicare Other | Admitting: Cardiology

## 2012-06-13 VITALS — BP 132/80 | HR 74 | Ht 72.0 in | Wt 272.0 lb

## 2012-06-13 DIAGNOSIS — Z86711 Personal history of pulmonary embolism: Secondary | ICD-10-CM

## 2012-06-13 DIAGNOSIS — I359 Nonrheumatic aortic valve disorder, unspecified: Secondary | ICD-10-CM

## 2012-06-13 DIAGNOSIS — I4891 Unspecified atrial fibrillation: Secondary | ICD-10-CM

## 2012-06-13 DIAGNOSIS — I2541 Coronary artery aneurysm: Secondary | ICD-10-CM

## 2012-06-13 DIAGNOSIS — G20 Parkinson's disease: Secondary | ICD-10-CM

## 2012-06-13 DIAGNOSIS — I350 Nonrheumatic aortic (valve) stenosis: Secondary | ICD-10-CM

## 2012-06-13 DIAGNOSIS — G459 Transient cerebral ischemic attack, unspecified: Secondary | ICD-10-CM

## 2012-06-13 LAB — BASIC METABOLIC PANEL
Chloride: 100 mEq/L (ref 96–112)
Creatinine, Ser: 1.1 mg/dL (ref 0.4–1.5)
Potassium: 3.4 mEq/L — ABNORMAL LOW (ref 3.5–5.1)
Sodium: 139 mEq/L (ref 135–145)

## 2012-06-13 LAB — TSH: TSH: 1.11 u[IU]/mL (ref 0.35–5.50)

## 2012-06-13 NOTE — Assessment & Plan Note (Signed)
Rate is controlled 

## 2012-06-13 NOTE — Assessment & Plan Note (Signed)
On chronic warfarin 

## 2012-06-13 NOTE — Assessment & Plan Note (Signed)
He likely has had prior small stroke

## 2012-06-13 NOTE — Patient Instructions (Addendum)
Your physician recommends that you have lab work today: BMP and TSH  Your physician recommends that you schedule a follow-up appointment in: 6 WEEKS

## 2012-06-13 NOTE — Assessment & Plan Note (Signed)
His family it is convinced that he would not be a good candidate for major surgery and I also agree with that assessment. He has Parkinson's disease with fairly significant tremor and akasthesia.  He is elderly. He's had prior pulmonary emboli. He is fairly limited his activity. At the present time, he is able to get out and about, and with the lack of major symptomatology we will plan to continue to monitor him clinically. He a prior evaluation for TAVR well relaxed intolerant Edwards device, the root dimensions were a problem.  We will continue to follow him closely I will see him back in followup in about 6-8 weeks. She denied any problems in the interview to contact us directly

## 2012-06-13 NOTE — Progress Notes (Signed)
HPI:  The patient overall is holding his own. He gets up and walks 2 times per day. He does have a little bit of numbness in his legs when he does walk. He denies chest pain or shortness of breath. He sometimes with a little bit of slight tightness left arm numbness up to the elbow. He is sleeping quite well. There no new major symptoms.  Current Outpatient Prescriptions  Medication Sig Dispense Refill  . acetaminophen (ARTHRITIS PAIN RELIEF) 650 MG CR tablet Take 650 mg by mouth. Take 2 tabs AM and 1 tab PM       . allopurinol (ZYLOPRIM) 300 MG tablet Take 300 mg by mouth daily.        Marland Kitchen aspirin 81 MG chewable tablet Chew 81 mg by mouth daily.      . carbidopa-levodopa (SINEMET CR) 50-200 MG per tablet Take 1 tablet by mouth 3 (three) times daily.  90 tablet  0  . Cyanocobalamin (VITAMIN B-12 IJ) Inject as directed every 30 (thirty) days.      . furosemide (LASIX) 80 MG tablet Take 1 tablet (80 mg total) by mouth daily.  30 tablet  1  . levothyroxine (SYNTHROID) 175 MCG tablet Take 175 mcg by mouth daily.        Marland Kitchen LORazepam (ATIVAN) 1 MG tablet Take 1 tablet (1 mg total) by mouth 2 (two) times daily as needed for anxiety.  30 tablet  0  . metoprolol (TOPROL XL) 50 MG 24 hr tablet Take 50 mg by mouth daily.        . Misc Natural Products (OSTEO BI-FLEX ADV DOUBLE ST PO) Take by mouth. Take 2 tabs daily       . Omega-3 Fatty Acids (FISH OIL) 1000 MG CAPS Take by mouth. Take 1 capsule by mouth two times a day       . Omeprazole 20 MG TBEC Take by mouth. 1 tablet once a day       . rosuvastatin (CRESTOR) 10 MG tablet Take 10 mg by mouth daily.      . sucralfate (CARAFATE) 1 G tablet Take 1 g by mouth 2 (two) times daily.        Marland Kitchen warfarin (COUMADIN) 2.5 MG tablet Take 1 tablet (2.5 mg total) by mouth daily. Takes 2.5mg  (1 tablet) daily except 5mg  (2 tablets) on Monday, Wednesday, and Friday  30 tablet  0    Allergies  Allergen Reactions  . Morphine And Related     Goes Crazy  . Nifedipine  Other (See Comments)    "makes me go crazy" procardia    Past Medical History  Diagnosis Date  . Pulmonary embolus     bilateral  . Hypothyroid   . Hypertension   . CAD (coronary artery disease)   . Chronic atrial fibrillation     INR therpeutic on Coumadin  . Dementia     hallucinations status post psychiatric evaluation during admission  . Kidney cyst, acquired   . A-fib   . CAD (coronary artery disease)     cath 2006, no stents, ejection fraction  . Leukocytosis   . Tremor   . Parkinson disease   . Severe aortic stenosis     Past Surgical History  Procedure Date  . Mole removakl     Family History  Problem Relation Age of Onset  . Heart failure Mother   . Microcephaly Father     History   Social History  . Marital Status:  Married    Spouse Name: N/A    Number of Children: N/A  . Years of Education: 12   Occupational History  .     Social History Main Topics  . Smoking status: Former Smoker -- 2.0 packs/day for 12 years    Types: Cigarettes  . Smokeless tobacco: Not on file  . Alcohol Use: No  . Drug Use: No  . Sexually Active: Not on file   Other Topics Concern  . Not on file   Social History Narrative   PCP is Renae Fickle, Cardiologist is Dr. [name]. He receives Medicare. Power of [name] is his son: Constantino Starace - [phone]. Caretaker is [name]: [phone].Originally from [place], completed high school. 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 He currently lives with his wife in [place]. For the past 65 months, he had a home health nurse caring for him. His daughter has been living with he and his wife since his wife had a valve replacement in 6/13.Has not smoked in 40 years.     ROS: Please see the HPI.  All other systems reviewed and negative.  PHYSICAL EXAM:  Ht 6' (1.829 m)  Wt 272 lb (123.378 kg)  BMI 36.89 kg/m2  General: Well developed, well nourished, in no acute  distress. Head:  Normocephalic and atraumatic. Neck: no JVD Lungs: Clear to auscultation and percussion. Heart: irregularly irregular rhythm.  3/6  SEM.  No DM.   Abdomen:  Normal bowel sounds; soft; non tender; no organomegaly Pulses: Pulses normal in all 4 extremities. Extremities: No clubbing or cyanosis. No edema.  Chronic venous stasis changes. Neurologic: Alert and oriented x 3.  EKG:  Atrial fib with controlled ventricular response. Low voltage QRS.    ASSESSMENT AND PLAN:  Given his low voltage will check thyroid function studies.

## 2012-06-13 NOTE — Assessment & Plan Note (Signed)
This is fairly severe and on medication

## 2012-06-16 ENCOUNTER — Other Ambulatory Visit: Payer: Self-pay

## 2012-06-16 MED ORDER — POTASSIUM CHLORIDE CRYS ER 20 MEQ PO TBCR: 20.0000 meq | EXTENDED_RELEASE_TABLET | Freq: Every day | ORAL | Status: DC

## 2012-07-20 ENCOUNTER — Ambulatory Visit (INDEPENDENT_AMBULATORY_CARE_PROVIDER_SITE_OTHER): Payer: Medicare Other | Admitting: Cardiology

## 2012-07-20 ENCOUNTER — Encounter: Payer: Self-pay | Admitting: Cardiology

## 2012-07-20 VITALS — BP 124/76 | HR 84 | Ht 72.0 in | Wt 273.8 lb

## 2012-07-20 DIAGNOSIS — I2541 Coronary artery aneurysm: Secondary | ICD-10-CM

## 2012-07-20 DIAGNOSIS — I359 Nonrheumatic aortic valve disorder, unspecified: Secondary | ICD-10-CM

## 2012-07-20 DIAGNOSIS — E7800 Pure hypercholesterolemia, unspecified: Secondary | ICD-10-CM

## 2012-07-20 DIAGNOSIS — I4891 Unspecified atrial fibrillation: Secondary | ICD-10-CM

## 2012-07-20 DIAGNOSIS — I350 Nonrheumatic aortic (valve) stenosis: Secondary | ICD-10-CM

## 2012-07-20 NOTE — Patient Instructions (Addendum)
Your physician recommends that you continue on your current medications as directed. Please refer to the Current Medication list given to you today.  Your physician recommends that you schedule a follow-up appointment in: March with Dr. Stuckey  

## 2012-07-31 NOTE — Progress Notes (Signed)
HPI:  The patient returns in followup. Clinically he seems to be doing really quite a bit better, and seems to be more ambulatory. He is walking on a regular basis. After lengthy discussions, with suggested taking a conservative course of action in this case given to his multiple comorbidities, and somewhat fragile status both from a medical as well as a cognitive standpoint according to both his wife and his son.  Current Outpatient Prescriptions  Medication Sig Dispense Refill  . acetaminophen (ARTHRITIS PAIN RELIEF) 650 MG CR tablet Take 650 mg by mouth. Take 2 tabs AM and 1 tab PM       . allopurinol (ZYLOPRIM) 300 MG tablet Take 300 mg by mouth daily.        Marland Kitchen aspirin 81 MG chewable tablet Chew 81 mg by mouth daily.      . carbidopa-levodopa (SINEMET CR) 50-200 MG per tablet Take 1 tablet by mouth 3 (three) times daily.  90 tablet  0  . Cyanocobalamin (VITAMIN B-12 IJ) Inject as directed every 30 (thirty) days.      . furosemide (LASIX) 80 MG tablet Take 1 tablet (80 mg total) by mouth daily.  30 tablet  1  . levothyroxine (SYNTHROID) 175 MCG tablet Take 175 mcg by mouth daily.        Marland Kitchen LORazepam (ATIVAN) 1 MG tablet Take 1 tablet (1 mg total) by mouth 2 (two) times daily as needed for anxiety.  30 tablet  0  . metoprolol (TOPROL XL) 50 MG 24 hr tablet Take 50 mg by mouth daily.        . Misc Natural Products (OSTEO BI-FLEX ADV DOUBLE ST PO) Take by mouth. Take 2 tabs daily       . Omega-3 Fatty Acids (FISH OIL) 1000 MG CAPS Take by mouth. Take 1 capsule by mouth two times a day       . Omeprazole 20 MG TBEC Take by mouth. 1 tablet once a day       . potassium chloride SA (K-DUR,KLOR-CON) 20 MEQ tablet Take 1 tablet (20 mEq total) by mouth daily.  30 tablet  11  . rosuvastatin (CRESTOR) 10 MG tablet Take 10 mg by mouth daily.      . sucralfate (CARAFATE) 1 G tablet Take 1 g by mouth 2 (two) times daily.        Marland Kitchen warfarin (COUMADIN) 2.5 MG tablet Take 1 tablet (2.5 mg total) by mouth  daily. Takes 2.5mg  (1 tablet) daily except 5mg  (2 tablets) on Monday, Wednesday, and Friday  30 tablet  0    Allergies  Allergen Reactions  . Morphine And Related     Goes Crazy  . Nifedipine Other (See Comments)    "makes me go crazy" procardia    Past Medical History  Diagnosis Date  . Pulmonary embolus     bilateral  . Hypothyroid   . Hypertension   . CAD (coronary artery disease)   . Chronic atrial fibrillation     INR therpeutic on Coumadin  . Dementia     hallucinations status post psychiatric evaluation during admission  . Kidney cyst, acquired   . A-fib   . CAD (coronary artery disease)     cath 2006, no stents, ejection fraction  . Leukocytosis   . Tremor   . Parkinson disease   . Severe aortic stenosis     Past Surgical History  Procedure Date  . Mole removakl     Family History  Problem Relation Age of Onset  . Heart failure Mother   . Microcephaly Father     History   Social History  . Marital Status: Married    Spouse Name: N/A    Number of Children: N/A  . Years of Education: 12   Occupational History  .     Social History Main Topics  . Smoking status: Former Smoker -- 2.0 packs/day for 12 years    Types: Cigarettes  . Smokeless tobacco: Not on file  . Alcohol Use: No  . Drug Use: No  . Sexually Active: Not on file   Other Topics Concern  . Not on file   Social History Narrative   PCP is Renae Fickle, Cardiologist is Dr. [name]. He receives Medicare. Power of [name] is his son: Gay Rape - [phone]. Caretaker is [name]: [phone].Originally from [place], completed high school. 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 He currently lives with his wife in [place]. For the past 65 months, he had a home health nurse caring for him. His daughter has been living with he and his wife since his wife had a valve replacement in 6/13.Has not smoked in 40 years.     ROS: Please  see the HPI.  All other systems reviewed and negative.  PHYSICAL EXAM:  BP 124/76  Pulse 84  Ht 6' (1.829 m)  Wt 273 lb 12.8 oz (124.195 kg)  BMI 37.13 kg/m2  SpO2 95%  General: Well developed, well nourished, in no acute distress. Head:  Normocephalic and atraumatic. Neck: no JVD Lungs: Clear to auscultation and percussion. Heart: Normal S1 and S2.  No murmur, rubs or gallops.  Abdomen:  Normal bowel sounds; soft; non tender; no organomegaly Pulses: Pulses normal in all 4 extremities. Extremities: No clubbing or cyanosis. No edema. Neurologic: Alert and oriented x 3.  EKG:  Atrial fib with controlled ventricular response.  Leftward axis.   ASSESSMENT AND PLAN:

## 2012-08-01 NOTE — Assessment & Plan Note (Signed)
He is rate controlled, and is on anticoagulation.  He is note having major issues at the present time, and his functional status seems some improved.  He will continue on his current regimen.

## 2012-08-01 NOTE — Assessment & Plan Note (Signed)
Noted on CT in [place] at time of TAVR eval.

## 2012-08-01 NOTE — Assessment & Plan Note (Addendum)
Everyone has been reluctant, in part due to multiple issues, to consider him for a TAVR procedure.  Moreover, he has had one episode of syncope, but no CHF or angina.  We will continue to see him frequently as I see his wife often. Also, root was too large during initial evaluation.

## 2012-08-01 NOTE — Assessment & Plan Note (Signed)
Has been followed in primary care.

## 2012-10-24 ENCOUNTER — Ambulatory Visit (INDEPENDENT_AMBULATORY_CARE_PROVIDER_SITE_OTHER): Payer: Medicare Other | Admitting: Cardiology

## 2012-10-24 ENCOUNTER — Encounter: Payer: Self-pay | Admitting: Cardiology

## 2012-10-24 VITALS — BP 140/72 | HR 67 | Ht 72.0 in | Wt 285.0 lb

## 2012-10-24 DIAGNOSIS — I350 Nonrheumatic aortic (valve) stenosis: Secondary | ICD-10-CM

## 2012-10-24 DIAGNOSIS — Z86711 Personal history of pulmonary embolism: Secondary | ICD-10-CM

## 2012-10-24 DIAGNOSIS — I359 Nonrheumatic aortic valve disorder, unspecified: Secondary | ICD-10-CM

## 2012-10-24 DIAGNOSIS — I4891 Unspecified atrial fibrillation: Secondary | ICD-10-CM

## 2012-10-24 NOTE — Patient Instructions (Addendum)
Follow up with Dr.Cooper in 3 months with an ECHO at the same time  Your physician has requested that you have an echocardiogram. Echocardiography is a painless test that uses sound waves to create images of your heart. It provides your doctor with information about the size and shape of your heart and how well your heart's chambers and valves are working. This procedure takes approximately one hour. There are no restrictions for this procedure.

## 2012-10-24 NOTE — Progress Notes (Signed)
HPI:  This patient returns today in follow up.  There is not much change in status.  He is not congested nor is he having significant chest pain.  He has severe AS and has been seen previously in the Structural [facility] at [facility].  He did not have a valve large enough for his root, and given his comorbidities the decision was made to hold off.  He also has an RCA coronary aneurysm.  He did poorly when his wife was in the hospital for four months with MVR, but he has bounced back since she has stabilized.  No current symptoms per se.  His INR was therapeutic at Dr. Martha Clan office and he has had his labs done within the past four weeks.  He appears in good spirits.    Current Outpatient Prescriptions  Medication Sig Dispense Refill  . acetaminophen (ARTHRITIS PAIN RELIEF) 650 MG CR tablet Take 650 mg by mouth. Take 2 tabs AM and 1 tab PM       . allopurinol (ZYLOPRIM) 300 MG tablet Take 300 mg by mouth daily.        Marland Kitchen aspirin 81 MG chewable tablet Chew 81 mg by mouth daily.      . carbidopa-levodopa (SINEMET CR) 50-200 MG per tablet Take 1 tablet by mouth 3 (three) times daily.  90 tablet  0  . Cyanocobalamin (VITAMIN B-12 IJ) Inject as directed every 30 (thirty) days.      . furosemide (LASIX) 80 MG tablet Take 1 tablet (80 mg total) by mouth daily.  30 tablet  1  . levothyroxine (SYNTHROID) 175 MCG tablet Take 175 mcg by mouth daily.        Marland Kitchen LORazepam (ATIVAN) 1 MG tablet Take 1 tablet (1 mg total) by mouth 2 (two) times daily as needed for anxiety.  30 tablet  0  . metoprolol (TOPROL XL) 50 MG 24 hr tablet Take 50 mg by mouth daily.        . Misc Natural Products (OSTEO BI-FLEX ADV DOUBLE ST PO) Take by mouth. Take 2 tabs daily       . Omega-3 Fatty Acids (FISH OIL) 1000 MG CAPS Take by mouth. Take 1 capsule by mouth two times a day       . Omeprazole 20 MG TBEC Take by mouth. 1 tablet once a day       . potassium chloride SA (K-DUR,KLOR-CON) 20 MEQ tablet Take 1 tablet (20 mEq total) by mouth  daily.  30 tablet  11  . rosuvastatin (CRESTOR) 10 MG tablet Take 10 mg by mouth daily.      . sucralfate (CARAFATE) 1 G tablet Take 1 g by mouth 2 (two) times daily.        Marland Kitchen warfarin (COUMADIN) 2.5 MG tablet Take 1 tablet (2.5 mg total) by mouth daily. Takes 2.5mg  (1 tablet) daily except 5mg  (2 tablets) on Monday, Wednesday, and Friday  30 tablet  0   No current facility-administered medications for this visit.    Allergies  Allergen Reactions  . Morphine And Related     Goes Crazy  . Nifedipine Other (See Comments)    "makes me go crazy" procardia    Past Medical History  Diagnosis Date  . Pulmonary embolus     bilateral  . Hypothyroid   . Hypertension   . CAD (coronary artery disease)   . Chronic atrial fibrillation     INR therpeutic on Coumadin  . Dementia  hallucinations status post psychiatric evaluation during admission  . Kidney cyst, acquired   . A-fib   . CAD (coronary artery disease)     cath 2006, no stents, ejection fraction  . Leukocytosis   . Tremor   . Parkinson disease   . Severe aortic stenosis     Past Surgical History  Procedure Laterality Date  . Mole removakl      Family History  Problem Relation Age of Onset  . Heart failure Mother   . Microcephaly Father     History   Social History  . Marital Status: Married    Spouse Name: N/A    Number of Children: N/A  . Years of Education: 12   Occupational History  .     Social History Main Topics  . Smoking status: Former Smoker -- 2.00 packs/day for 12 years    Types: Cigarettes  . Smokeless tobacco: Not on file  . Alcohol Use: No  . Drug Use: No  . Sexually Active: Not on file   Other Topics Concern  . Not on file   Social History Narrative   PCP is Renae Fickle, Cardiologist is Dr. [name]. He receives Medicare. Power of [name] is his son: Anchor Dwan - [phone]. Caretaker is [name]: [phone].      Originally from [place], completed high school. 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 He currently lives with his wife in [place]. For the past 65 months, he had a home health nurse caring for him. His daughter has been living with he and his wife since his wife had a valve replacement in 6/13.      Has not smoked in 40 years.     ROS: Please see the HPI.  All other systems reviewed and negative.  PHYSICAL EXAM:  BP 140/72  Pulse 67  Ht 6' (1.829 m)  Wt 285 lb (129.275 kg)  BMI 38.64 kg/m2  SpO2 96%  General: Well developed, well nourished, in no acute distress. Head:  Normocephalic and atraumatic. Neck: no JVD Lungs: Clear to auscultation and percussion. Heart: Irregularly irregular rhythm.  Late peaking 4/6 SEM at ULSE radiating to the carotids.  No DM.  Marland Kitchen  Abdomen:  Normal bowel sounds; soft; non tender; no organomegaly Pulses: Pulses normal in all 4 extremities. Extremities: No clubbing or cyanosis. No edema. Chronic venous stasis changes, but no def edema or ulcers.   Neurologic: Alert and oriented x 3.  EKG:  Atrial fib with controlled ventricular response.  ILBBB.   ASSESSMENT AND PLAN:  1.  Severe AS with multiple comorbidites 2.  His of pE 3.  Permanent atrial fibrillation.  He is very high risk and a conservative course of management has largely been favored.  Should he become symptomatic, and his neurocognitive status does not deteriorate, he might be considered for TAVR under the appropriate conditions.  Dr. Samule Ohm did feel previously that his iliacs would support an Edwards percutaneous approach, but that the larger valve would be required.

## 2012-10-27 NOTE — Assessment & Plan Note (Signed)
See my current note.  No change in overall status at this point.  He will continue follow up here.  He could be reassessed for TAVR if symptoms worsen, but given his frailty and lack of meaningful symptoms conservative management seems warranted

## 2012-10-27 NOTE — Assessment & Plan Note (Signed)
Rate is controlled.  Remains on warfarin as high risk for thromboembolic events

## 2012-10-27 NOTE — Assessment & Plan Note (Signed)
Continue warfarin.  High risk for recurrence.

## 2013-01-06 ENCOUNTER — Other Ambulatory Visit: Payer: Self-pay

## 2013-01-06 DIAGNOSIS — G20 Parkinson's disease: Secondary | ICD-10-CM

## 2013-01-20 ENCOUNTER — Ambulatory Visit (INDEPENDENT_AMBULATORY_CARE_PROVIDER_SITE_OTHER): Payer: Medicare Other | Admitting: Diagnostic Neuroimaging

## 2013-01-20 ENCOUNTER — Encounter: Payer: Self-pay | Admitting: Diagnostic Neuroimaging

## 2013-01-20 VITALS — BP 128/72 | HR 84 | Ht 72.0 in | Wt 283.0 lb

## 2013-01-20 DIAGNOSIS — G20 Parkinson's disease: Secondary | ICD-10-CM

## 2013-01-20 DIAGNOSIS — F0280 Dementia in other diseases classified elsewhere without behavioral disturbance: Secondary | ICD-10-CM

## 2013-01-20 DIAGNOSIS — G20A1 Dementia in other diseases classified elsewhere, unspecified severity, without behavioral disturbance, psychotic disturbance, mood disturbance, and anxiety: Secondary | ICD-10-CM

## 2013-01-20 HISTORY — DX: Dementia in other diseases classified elsewhere, unspecified severity, without behavioral disturbance, psychotic disturbance, mood disturbance, and anxiety: G20.A1

## 2013-01-20 HISTORY — DX: Dementia in other diseases classified elsewhere, unspecified severity, without behavioral disturbance, psychotic disturbance, mood disturbance, and anxiety: F02.80

## 2013-01-20 NOTE — Progress Notes (Signed)
[facility]  PATIENT: [name] Graham DOB: 08-12-1928  REFERRING CLINICIAN: Minus Breeding, MD HISTORY FROM: son, patient, chart REASON FOR VISIT: consult to establish care   HISTORICAL  CHIEF COMPLAINT:  Chief Complaint  Patient presents with  . Neurologic Problem    Np#7    HISTORY OF PRESENT ILLNESS:  This is an 89 RHWM with progressive change in cognition, frequent falls, REM sleep disorder, Shuffling gait, rigidity, resting tremor, constipation, audio hallucinations and some visual hallucinations ( patient denies, but daughter mentions about ), bladder incontinent, this has been progressively getting worse since past 3 years. For the past 2 days he had significantly difficulty with ambulation, worsening of rigidity.   According to the daughter ( care giver) patient has become progressively agitated but does not use foul language and there is no inhibited behavior involved., no Aphasia, there is mild decline in personal hygiene but patient agrees to that he avoids showers because he is afraid he will get sick with either hot or cold water.  There no visua-spatial derangement.  He denies neck pain, no chest pain, no nausea, no vomiting, agrees to confusion that is worse when he wakes up in the morning and bladder incontinent.  He has significant trouble buttoning his clothes or Other things such has opening the door, feeding himself with fork and or spoon. He is a high risk for fall and given that he is on coumadin for cardiac arrythmia fall could really be catastrophic  His tremors get worse with distraction. Has been getting progressively dysphagic to a point that he gets choked.   Patient was started on Carbodopa/Levodopa during hospital admission in August 2013 with great clinical repsonse.  Family has concerns over progression of behavioral disturbance, they mention "sundowning" and wonder if medication adjustment is needed.   REVIEW OF SYSTEMS: Full 14 system  review of systems performed and notable only for This been lasering was urination problems impotence blurred vision and he isn't bruising easy bleeding swelling aching muscle cramps memory loss confusion numbness restless legs tremor anxiety.  ALLERGIES: Allergies  Allergen Reactions  . Morphine And Related     Goes Crazy  . Nifedipine Other (See Comments)    "makes me go crazy" procardia    HOME MEDICATIONS: Outpatient Prescriptions Prior to Visit  Medication Sig Dispense Refill  . acetaminophen (ARTHRITIS PAIN RELIEF) 650 MG CR tablet Take 650 mg by mouth. Take 2 tabs AM and 1 tab PM       . allopurinol (ZYLOPRIM) 300 MG tablet Take 300 mg by mouth daily.        Marland Kitchen aspirin 81 MG chewable tablet Chew 81 mg by mouth daily.      . carbidopa-levodopa (SINEMET CR) 50-200 MG per tablet Take 1 tablet by mouth 3 (three) times daily.  90 tablet  0  . Cyanocobalamin (VITAMIN B-12 IJ) Inject as directed every 30 (thirty) days.      . furosemide (LASIX) 80 MG tablet Take 1 tablet (80 mg total) by mouth daily.  30 tablet  1  . levothyroxine (SYNTHROID) 175 MCG tablet Take 175 mcg by mouth daily.        Marland Kitchen LORazepam (ATIVAN) 1 MG tablet Take 1 tablet (1 mg total) by mouth 2 (two) times daily as needed for anxiety.  30 tablet  0  . metoprolol (TOPROL XL) 50 MG 24 hr tablet Take 50 mg by mouth daily.        . Misc Natural Products (OSTEO  BI-FLEX ADV DOUBLE ST PO) Take by mouth. Take 2 tabs daily       . Omega-3 Fatty Acids (FISH OIL) 1000 MG CAPS Take by mouth. Take 1 capsule by mouth two times a day       . potassium chloride SA (K-DUR,KLOR-CON) 20 MEQ tablet Take 1 tablet (20 mEq total) by mouth daily.  30 tablet  11  . rosuvastatin (CRESTOR) 10 MG tablet Take 10 mg by mouth daily.      . sucralfate (CARAFATE) 1 G tablet Take 1 g by mouth 2 (two) times daily.        Marland Kitchen warfarin (COUMADIN) 2.5 MG tablet Take 1 tablet (2.5 mg total) by mouth daily. Takes 2.5mg  (1 tablet) daily except 5mg  (2 tablets) on  Monday, Wednesday, and Friday  30 tablet  0  . Omeprazole 20 MG TBEC Take by mouth. 1 tablet once a day        No facility-administered medications prior to visit.    PAST MEDICAL HISTORY: Past Medical History  Diagnosis Date  . Pulmonary embolus     bilateral  . Hypothyroid   . Hypertension   . CAD (coronary artery disease)   . Chronic atrial fibrillation     INR therpeutic on Coumadin  . Dementia     hallucinations status post psychiatric evaluation during admission  . Kidney cyst, acquired   . A-fib   . CAD (coronary artery disease)     cath 2006, no stents, ejection fraction  . Leukocytosis   . Tremor   . Parkinson disease   . Severe aortic stenosis     PAST SURGICAL HISTORY: Past Surgical History  Procedure Laterality Date  . Mole removakl      FAMILY HISTORY: Family History  Problem Relation Age of Onset  . Heart failure Mother   . Microcephaly Father     SOCIAL HISTORY:  History   Social History  . Marital Status: Married    Spouse Name: N/A    Number of Children: 6  . Years of Education: 12   Occupational History  . retired    Social History Main Topics  . Smoking status: Former Smoker -- 2.00 packs/day for 12 years    Types: Cigarettes  . Smokeless tobacco: Not on file  . Alcohol Use: No  . Drug Use: No  . Sexually Active: Not on file   Other Topics Concern  . Not on file   Social History Narrative   PCP is Renae Fickle, Cardiologist is Dr. [name]. He receives Medicare. Power of [name] is his son: Casin Federici - [phone]. Caretaker is [name]: [phone].      Originally from [place], completed high school. 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 He currently lives with his wife in [place]. For the past 65 months, he had a home health nurse caring for him. His daughter has been living with he and his wife since his wife had a valve replacement in 6/13.      Has not smoked in  40 years.      PHYSICAL EXAM  Filed Vitals:   01/20/13 0917  BP: 128/72  Pulse: 84  Height: 6' (1.829 m)  Weight: 283 lb (128.368 kg)    Not recorded    Body mass index is 38.37 kg/(m^2).  GENERAL EXAM: Patient is in no distress  CARDIOVASCULAR: Regular rate and rhythm, no murmurs, no carotid bruits  NEUROLOGIC:  MENTAL STATUS: awake, alert, language fluent, comprehension intact, naming intact; MMSE 25/30. GDS 4. AFT 13. NEG SNOUT. NEG MYERSON. CRANIAL NERVE: no papilledema on fundoscopic exam, pupils equal and reactive to light, visual fields full to confrontation, extraocular muscles intact, no nystagmus, facial sensation and strength symmetric, uvula midline, shoulder shrug symmetric, tongue midline. MOTOR: REST TREMOR IN BUE. INCR TONE IN BUE. BRADYKINESIA IN LUE AND LLE. Normal bulk and tone, full strength in the BUE, BLE SENSORY: normal and symmetric to light touch, pinprick, temperature, vibration COORDINATION: finger-nose-finger, fine finger movements normal REFLEXES: deep tendon reflexes present and symmetric GAIT/STATION: SHORT SMALL STEPS. DECR ARM SWING. USES CANE. UNSTEADY.   DIAGNOSTIC DATA (LABS, IMAGING, TESTING) - I reviewed patient records, labs, notes, testing and imaging myself where available.  Lab Results  Component Value Date   WBC 6.1 03/29/2012   HGB 11.6* 03/29/2012   HCT 34.3* 03/29/2012   MCV 96.1 03/29/2012   PLT 190 03/29/2012      Component Value Date/Time   NA 139 06/13/2012 1430   K 3.4* 06/13/2012 1430   CL 100 06/13/2012 1430   CO2 32 06/13/2012 1430   GLUCOSE 88 06/13/2012 1430   BUN 22 06/13/2012 1430   CREATININE 1.1 06/13/2012 1430   CALCIUM 8.9 06/13/2012 1430   PROT 6.7 03/18/2012 1038   ALBUMIN 3.2* 03/18/2012 1038   AST 15 03/18/2012 1038   ALT 8 03/18/2012 1038   ALKPHOS 86 03/18/2012 1038   BILITOT 0.8 03/18/2012 1038   GFRNONAA 76* 03/29/2012 0509   GFRAA 88* 03/29/2012 0509   No results found for this basename: CHOL,  HDL, LDLCALC, LDLDIRECT, TRIG, CHOLHDL   Lab Results  Component Value Date   HGBA1C  Value: 7.6 (NOTE)   The ADA recommends the following therapeutic goals for glycemic   control related to Hgb A1C measurement:   Goal of Therapy:   < 7.0% Hgb A1C   Action Suggested:  > 8.0% Hgb A1C   Ref:  Diabetes Care, 22, Suppl. 1, 1999* 08/26/2007   Lab Results  Component Value Date   VITAMINB12 728 03/18/2012   Lab Results  Component Value Date   TSH 1.11 06/13/2012    MRI HEAD Wo CONTRAST  08/21/11 Atrophy and small vessel disease.  No acute intracranial findings.  CT HEAD WITHOUT CONTRAST 03/18/12 No intracranial hemorrhage.  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  Global atrophy.  Complete opacification right maxillary sinus. Mucosal  thickening/partial opacification ethmoid sinus air cells.  MRI THORACIC SPINE WITHOUT CONTRAST 03/18/12 No acute or significant findings in the thoracic spine. Age congruent degenerative changes.  Lumbar findings are below.  MRI LUMBAR SPINE WITHOUT CONTRAST  03/18/12 Transitional lumbosacral anatomy, lumbarized S1 level and full size S1-S2 disc. Correlation with radiographs is recommended prior to any operative intervention.  Moderate multifactorial spinal stenosis at L4-L5 with mild lateral recess and left greater than right L4 foraminal stenosis.  Mild to moderate multifactorial spinal stenosis at L5-S1 in the setting of spondylolisthesis and advanced facet degeneration. Mild lateral recess and mild to moderate bilateral L5 foraminal stenosis. Chronic multicystic renal disease, grossly stable since 2009.  ASSESSMENT AND PLAN  79 y.o. year old male  has a past medical history of Pulmonary embolus; Hypothyroid; Hypertension; CAD (coronary artery disease); Chronic atrial fibrillation; Dementia; Kidney cyst, acquired; A-fib; CAD (coronary artery  disease); Leukocytosis; Tremor; Parkinson disease; and Severe aortic stenosis.  here with progressive Parkinson's Disease, resting tremor initially was on right hand but now has progressed b/l, shuffling gait, REM Sleep disorder. Dysphagia, some hallucinations, with no EOMI deficits, fine motor skills abnormalities.  Now with mild behavioral disturbance, mainly at night.   Dx: parkinson's disease + dementia  PLAN: 1) Continue Carbodopa/Levodopa ER 50-200 TID 2) continue ativan prn agitation (seems to be working for now) 3. Use walker; high fall risk and on coumadin 4) Follow up in 6 months   Suanne Marker, MD 01/20/2013, 9:36 AM Certified in Neurology, Neurophysiology and Neuroimaging  [facility] [address], [address] [place], [place] [address] [phone]

## 2013-01-20 NOTE — Patient Instructions (Addendum)
No changes in medication at this time.  Follow up visit in 6 months.

## 2013-01-27 ENCOUNTER — Encounter: Payer: Self-pay | Admitting: Cardiovascular Disease

## 2013-01-27 ENCOUNTER — Ambulatory Visit (HOSPITAL_COMMUNITY): Payer: Medicare Other | Attending: Cardiology

## 2013-01-27 ENCOUNTER — Ambulatory Visit (INDEPENDENT_AMBULATORY_CARE_PROVIDER_SITE_OTHER): Payer: Medicare Other | Admitting: Cardiovascular Disease

## 2013-01-27 VITALS — BP 132/78 | HR 71 | Ht 72.0 in | Wt 282.1 lb

## 2013-01-27 DIAGNOSIS — I359 Nonrheumatic aortic valve disorder, unspecified: Secondary | ICD-10-CM

## 2013-01-27 DIAGNOSIS — I10 Essential (primary) hypertension: Secondary | ICD-10-CM | POA: Insufficient documentation

## 2013-01-27 DIAGNOSIS — I350 Nonrheumatic aortic (valve) stenosis: Secondary | ICD-10-CM

## 2013-01-27 DIAGNOSIS — Z86711 Personal history of pulmonary embolism: Secondary | ICD-10-CM | POA: Insufficient documentation

## 2013-01-27 DIAGNOSIS — I2510 Atherosclerotic heart disease of native coronary artery without angina pectoris: Secondary | ICD-10-CM | POA: Insufficient documentation

## 2013-01-27 DIAGNOSIS — Z87891 Personal history of nicotine dependence: Secondary | ICD-10-CM | POA: Insufficient documentation

## 2013-01-27 NOTE — Patient Instructions (Addendum)
Your physician recommends that you schedule a follow-up appointment in: 3 MONTHS with Dr Cooper  Your physician recommends that you continue on your current medications as directed. Please refer to the Current Medication list given to you today.  

## 2013-01-27 NOTE — Progress Notes (Signed)
Echocardiogram performed.  

## 2013-01-31 ENCOUNTER — Encounter: Payer: Self-pay | Admitting: Cardiovascular Disease

## 2013-01-31 NOTE — Progress Notes (Signed)
HPI:  79 year old gentleman presenting for followup evaluation. The patient has been followed by Dr. [name] for many years. He has coronary artery disease and severe aortic stenosis. He's had chronic atrial fibrillation. He's been fairly debilitated physically but more recently has been able to do a little walking. He does not feel particularly limited by shortness of breath or chest pain. He denies orthopnea or PND. He's also had issues with his neurocognitive status. He's been evaluated in the multidisciplinary valve clinic at [facility] and was deemed a poor candidate for TAVR, I believe because of anatomic considerations.  Outpatient Encounter Prescriptions as of 01/27/2013  Medication Sig Dispense Refill  . acetaminophen (ARTHRITIS PAIN RELIEF) 650 MG CR tablet Take 650 mg by mouth. Take 2 tabs AM and 1 tab PM       . allopurinol (ZYLOPRIM) 300 MG tablet Take 300 mg by mouth daily.        Marland Kitchen aspirin 81 MG chewable tablet Chew 81 mg by mouth daily.      . carbidopa-levodopa (SINEMET CR) 50-200 MG per tablet Take 1 tablet by mouth 3 (three) times daily.  90 tablet  0  . Cyanocobalamin (VITAMIN B-12 IJ) Inject as directed every 30 (thirty) days.      . furosemide (LASIX) 80 MG tablet Take 1 tablet (80 mg total) by mouth daily.  30 tablet  1  . levothyroxine (SYNTHROID) 175 MCG tablet Take 175 mcg by mouth daily.        Marland Kitchen LORazepam (ATIVAN) 1 MG tablet Take 1 tablet (1 mg total) by mouth 2 (two) times daily as needed for anxiety.  30 tablet  0  . metoprolol (TOPROL XL) 50 MG 24 hr tablet Take 50 mg by mouth daily.        . Misc Natural Products (OSTEO BI-FLEX ADV DOUBLE ST PO) Take by mouth. Take 2 tabs daily       . Omega-3 Fatty Acids (FISH OIL) 1000 MG CAPS Take by mouth. Take 1 capsule by mouth two times a day       . pantoprazole (PROTONIX) 40 MG tablet       . potassium chloride SA (K-DUR,KLOR-CON) 20 MEQ tablet Take 1 tablet (20 mEq total) by mouth daily.  30 tablet  11  .  rosuvastatin (CRESTOR) 10 MG tablet Take 10 mg by mouth daily.      . sucralfate (CARAFATE) 1 G tablet Take 1 g by mouth 2 (two) times daily.        Marland Kitchen warfarin (COUMADIN) 2.5 MG tablet Take 2.5 mg by mouth daily. Takes 2.5mg  (1 tablet) daily except 5mg  (2 tablets) on Monday and Friday      . [DISCONTINUED] warfarin (COUMADIN) 2.5 MG tablet Take 1 tablet (2.5 mg total) by mouth daily. Takes 2.5mg  (1 tablet) daily except 5mg  (2 tablets) on Monday, Wednesday, and Friday  30 tablet  0   No facility-administered encounter medications on file as of 01/27/2013.    Allergies  Allergen Reactions  . Morphine And Related     Goes Crazy  . Nifedipine Other (See Comments)    "makes me go crazy" procardia    Past Medical History  Diagnosis Date  . Pulmonary embolus     bilateral  . Hypothyroid   . Hypertension   . CAD (coronary artery disease)   . Chronic atrial fibrillation     INR therpeutic on Coumadin  . Dementia     hallucinations status post psychiatric evaluation  during admission  . Kidney cyst, acquired   . A-fib   . CAD (coronary artery disease)     cath 2006, no stents, ejection fraction  . Leukocytosis   . Tremor   . Parkinson disease   . Severe aortic stenosis     ROS: Negative except as per HPI  BP 132/78  Pulse 71  Ht 6' (1.829 m)  Wt 127.969 kg (282 lb 1.9 oz)  BMI 38.25 kg/m2  PHYSICAL EXAM: Pt is alert and oriented, elderly male in NAD HEENT: normal Neck: JVP - normal, carotids delayed with bilateral bruits Lungs: CTA bilaterally CV: Irregularly irregular with grade 3/6 harsh systolic murmur at the) sternal border Abd: soft, NT, Positive BS, no hepatomegaly Ext: no C/C/E, distal pulses intact and equal Skin: warm/dry no rash  EKG:  Atrial fib 71 beats per minute, voltage criteria for LVH, ST and T wave abnormality consider lateral ischemia  Echo: Study Conclusions  - Left ventricle: The cavity size was normal. Wall thickness was increased in a pattern  of moderate LVH. Systolic function was normal. The estimated ejection fraction was in the range of 50% to 55%. - Aortic valve: AV is thickened, calcified with restricted motion Leflets are difficult to see. Peak and mean gradients through the valve are 80 and 50 mm Hg respectively consistent with severe AS. - Mitral valve: Mild regurgitation. - Left atrium: The atrium was severely dilated. - Right atrium: The atrium was moderately dilated. - Tricuspid valve: Mild-moderate regurgitation. - Pulmonary arteries: PA peak pressure: 46mm Hg (S).  ASSESSMENT AND PLAN: 1. Severe aortic stenosis. A bit difficult for me to make a determination about his symptomatic status at this time. I'm going to follow up with him closely in 3 months. His echo clearly meets criteria for severe aortic stenosis. He has multiple comorbidities including some decline in his neurocognitive status. I don't think there is any question that he would be a poor candidate for surgical aortic valve replacement, and the only consideration would be his potential for transcatheter aortic valve replacement. I am going to request the records from [facility] to review their evaluation.  2. Permanent atrial fibrillation. Maintained on chronic warfarin. Heart rate is controlled. Seems to be asymptomatic.  3. Hyperlipidemia. The patient is on Crestor.  4. Chronic diastolic heart failure. Suspect primarily due to aortic stenosis. He appears to be well compensated.  5. Hx of PE. Continue anticoagulation.  Tonny Bollman 01/31/2013 6:45 PM

## 2013-03-14 ENCOUNTER — Ambulatory Visit: Payer: Medicare Other

## 2013-03-14 ENCOUNTER — Other Ambulatory Visit: Payer: Medicare Other | Admitting: Lab

## 2013-04-28 ENCOUNTER — Ambulatory Visit (INDEPENDENT_AMBULATORY_CARE_PROVIDER_SITE_OTHER): Payer: Medicare Other | Admitting: Cardiovascular Disease

## 2013-04-28 ENCOUNTER — Encounter: Payer: Self-pay | Admitting: Cardiovascular Disease

## 2013-04-28 VITALS — BP 128/76 | HR 91 | Wt 283.0 lb

## 2013-04-28 DIAGNOSIS — I359 Nonrheumatic aortic valve disorder, unspecified: Secondary | ICD-10-CM

## 2013-04-28 NOTE — Progress Notes (Signed)
HPI:  [name] is an 79 year old gentleman presenting for followup evaluation. He has been followed for coronary artery disease and severe aortic stenosis. The patient also has permanent atrial fibrillation. She is also significantly limited by Parkinson's disease. He has been evaluated by the Art team at [facility] for transcatheter aortic valve replacement. This evaluation was done in 2012. At that time he was deemed a poor candidate for open surgical aortic valve replacement largely because of his physical limitation, frailty, and history of pulmonary emboli on warfarin. He was evaluated for transcatheter aortic valve replacement and his aortic annulus was too large for the valve prostheses that were available in 2012.  The patient continues to try to stay as active as he can. He walks 600 feet 4 times per day. He describes a 300 foot walk, 30 second rest, walks halfway back with another 30 second rest, then returns home. He experiences a "numbness" in his feet and in his head with walking. On careful questioning, he actually becomes lightheaded with exertion. He states that if he continued to walk, he thinks he would pass out. He denies exertional chest pain or shortness of breath. He denies leg swelling, palpitations, orthopnea, or PND.   Outpatient Encounter Prescriptions as of 04/28/2013  Medication Sig Dispense Refill  . acetaminophen (ARTHRITIS PAIN RELIEF) 650 MG CR tablet Take 650 mg by mouth. Take 2 tabs AM and 1 tab PM       . allopurinol (ZYLOPRIM) 300 MG tablet Take 300 mg by mouth daily.        Marland Kitchen aspirin 81 MG chewable tablet Chew 81 mg by mouth daily.      . carbidopa-levodopa (SINEMET CR) 50-200 MG per tablet Take 1 tablet by mouth 3 (three) times daily.  90 tablet  0  . Cyanocobalamin (VITAMIN B-12 IJ) Inject as directed every 30 (thirty) days.      . furosemide (LASIX) 80 MG tablet Take 1 tablet (80 mg total) by mouth daily.  30 tablet  1  . levothyroxine (SYNTHROID)  175 MCG tablet Take 175 mcg by mouth daily.        Marland Kitchen LORazepam (ATIVAN) 1 MG tablet Take 1 tablet (1 mg total) by mouth 2 (two) times daily as needed for anxiety.  30 tablet  0  . metoprolol (TOPROL XL) 50 MG 24 hr tablet Take 50 mg by mouth daily.        . Misc Natural Products (OSTEO BI-FLEX ADV DOUBLE ST PO) Take by mouth. Take 2 tabs daily       . Omega-3 Fatty Acids (FISH OIL) 1000 MG CAPS Take by mouth. Take 1 capsule by mouth two times a day       . pantoprazole (PROTONIX) 40 MG tablet       . potassium chloride SA (K-DUR,KLOR-CON) 20 MEQ tablet Take 1 tablet (20 mEq total) by mouth daily.  30 tablet  11  . rosuvastatin (CRESTOR) 10 MG tablet Take 10 mg by mouth daily.      . sucralfate (CARAFATE) 1 G tablet Take 1 g by mouth 2 (two) times daily.        Marland Kitchen warfarin (COUMADIN) 2.5 MG tablet Take 2.5 mg by mouth daily. Takes 2.5mg  (1 tablet) daily except 5mg  (2 tablets) on Monday and Friday       No facility-administered encounter medications on file as of 04/28/2013.    Allergies  Allergen Reactions  . Morphine And Related     Goes  Crazy  . Nifedipine Other (See Comments)    "makes me go crazy" procardia    Past Medical History  Diagnosis Date  . Pulmonary embolus     bilateral  . Hypothyroid   . Hypertension   . CAD (coronary artery disease)   . Chronic atrial fibrillation     INR therpeutic on Coumadin  . Dementia     hallucinations status post psychiatric evaluation during admission  . Kidney cyst, acquired   . A-fib   . CAD (coronary artery disease)     cath 2006, no stents, ejection fraction  . Leukocytosis   . Tremor   . Parkinson disease   . Severe aortic stenosis    ROS: Negative except as per HPI  BP 128/76  Pulse 91  Wt 128.368 kg (283 lb)  BMI 38.37 kg/m2  PHYSICAL EXAM: Pt is alert and oriented, elderly man in NAD HEENT: normal Neck: JVP - normal, carotids 1+ with bilateral bruits Lungs: CTA bilaterally CV: Irregularly irregular with grade 3/6  harsh crescendo decrescendo murmur at the RUSB with absent A2 Abd: soft, NT, Positive BS, no hepatomegaly Ext: trace pretibial edema bilaterally, distal pulses intact and equal Skin: warm/dry no rash  2-D echocardiogram 01/27/2013: Study Conclusions  - Left ventricle: The cavity size was normal. Wall thickness was increased in a pattern of moderate LVH. Systolic function was normal. The estimated ejection fraction was in the range of 50% to 55%. - Aortic valve: AV is thickened, calcified with restricted motion Leflets are difficult to see. Peak and mean gradients through the valve are 80 and 50 mm Hg respectively consistent with severe AS. - Mitral valve: Mild regurgitation. - Left atrium: The atrium was severely dilated. - Right atrium: The atrium was moderately dilated. - Tricuspid valve: Mild-moderate regurgitation. - Pulmonary arteries: PA peak pressure: 46mm Hg (S).  ASSESSMENT AND PLAN: 1. Severe symptomatic aortic stenosis. I think Mr. Myrie lightheadedness with activity is probably related to his aortic stenosis. This symptom is consistently occurring when he walks. His exam and echocardiogram are diagnostic of severe aortic stenosis. I reviewed his CAT scan results from [facility] in 2012. The CT angiogram of his abdomen and pelvis demonstrated suitable anatomy for transcatheter aortic valve access. The minimal iliofemoral diameter was 1.1 cm on both the right and left sides. His gated cardiac CT demonstrated a large valve annulus. In systole is annulus measured 26 x 32 mm with an average of 29 mm. The area of his annulus was 6.9 cm and the circumference was 96 mm. In diastole, the annulus measured 32 x 24.5 mm with an area of 6.6 cm and circumference of 93 mm. If he were deemed a candidate for transcatheter aortic valve replacement, these dimensions may be too big even for a 29 mm valve. I think it is important that we review this data. We will request copies of the  actual images from his CT scans and review them with our cardiac imaging team. I think the patient is currently in a window where we could consider intervention on his aortic valve stenosis. He understands the limitations of medical therapy and the potential consequences of congestive heart failure and death. He favors continued observation for now and is willing to return for followup in 3 months. He does not want to proceed with further workup before the holidays. I think the next step would be a careful evaluation with physical therapy to include formal frailty testing, a Mini-Mental Status exam, and a  6 minute walk. He would then require evaluation by Dr. Cornelius Moras in cardiac surgery and we could potentially see him together in Valve Clinic. If we review his films and decide his aortic valve annulus is too large for the Sapien Valve, he would need to be referred to a center who could implant a larger Medtronic CoreValve. When he returns for follow-up, we will make sure his family accompanies him.  2. Permanent atrial fibrillation. Continue warfarin. No apparent complications of anticoagulation.  3. Chronic diastolic heart failure. Overall stable. Continue current medical program.  Tonny Bollman 04/28/2013 10:24 AM   4.

## 2013-04-28 NOTE — Patient Instructions (Signed)
Your physician wants you to follow-up in: January 2015 with Dr Excell Seltzer.  You will receive a reminder letter in the mail two months in advance. If you don't receive a letter, please call our office to schedule the follow-up appointment.  Your physician recommends that you continue on your current medications as directed. Please refer to the Current Medication list given to you today.

## 2013-05-30 ENCOUNTER — Telehealth: Payer: Self-pay | Admitting: Cardiovascular Disease

## 2013-05-30 NOTE — Telephone Encounter (Signed)
I spoke with the pt's son and when the pt lays down he gets blood in his nose and then he sits up and blows his nose and he can breath through his nose again. The pt does not have nose bleeds. Per Barbara Cower the pt's most recent INRs have been within range.  I made him aware that this could be related to dryness in the nose.  I recommended using saline nasal spray as needed and a humidifier.

## 2013-05-30 NOTE — Telephone Encounter (Signed)
New problem:  Pt's son states, when his dad lays down, his nose fills up with blood.  Pt's son states, when the pt sits up, his nose clears up. Pt's son would like to be advised on what to do.

## 2013-06-23 ENCOUNTER — Other Ambulatory Visit: Payer: Self-pay | Admitting: Cardiology

## 2013-09-01 ENCOUNTER — Other Ambulatory Visit: Payer: Self-pay | Admitting: Cardiovascular Disease

## 2013-09-08 ENCOUNTER — Encounter: Payer: Self-pay | Admitting: Cardiovascular Disease

## 2013-09-08 ENCOUNTER — Ambulatory Visit (INDEPENDENT_AMBULATORY_CARE_PROVIDER_SITE_OTHER): Payer: Medicare Other | Admitting: Cardiovascular Disease

## 2013-09-08 VITALS — BP 164/102 | HR 85 | Ht 72.0 in | Wt 298.0 lb

## 2013-09-08 DIAGNOSIS — R0602 Shortness of breath: Secondary | ICD-10-CM

## 2013-09-08 DIAGNOSIS — I359 Nonrheumatic aortic valve disorder, unspecified: Secondary | ICD-10-CM

## 2013-09-08 DIAGNOSIS — I350 Nonrheumatic aortic (valve) stenosis: Secondary | ICD-10-CM

## 2013-09-08 LAB — BRAIN NATRIURETIC PEPTIDE: Pro B Natriuretic peptide (BNP): 156 pg/mL — ABNORMAL HIGH (ref 0.0–100.0)

## 2013-09-08 NOTE — Patient Instructions (Addendum)
Your physician recommends that you have lab work today:  BNP, BMET  Your physician has requested that you have an echocardiogram. Echocardiography is a painless test that uses sound waves to create images of your heart. It provides your doctor with information about the size and shape of your heart and how well your heart's chambers and valves are working. This procedure takes approximately one hour. There are no restrictions for this procedure.   Your physician has recommended you make the following change in your medication:  Make sure you are taking Lasix 80 mg Daily  Dr. Excell Seltzerooper will have his TAVR nurse, Darius Bumpyan Brooks call you to schedule some tests and appointments

## 2013-09-08 NOTE — Progress Notes (Signed)
HPI:  80 year old gentleman presenting for followup evaluation. He was last seen in September 2014. He is followed for aortic stenosis, congestive heart failure, and permanent atrial fibrillation. He has Parkinson's disease. He underwent an evaluation in 2012 for TAVR at [facility] He was deemed a poor candidate for open surgical aortic valve replacement because of physical limitations related to Parkinson's disease, frailty, and history of pulmonary emboli on chronic warfarin. His aortic valve annulus was too large for valve prostheses that were available in 2012.  The patient's wife passed away last month. He lives with his son. He is limited by pre-syncope that occurs with walking. He walks 300 feet to his mailbox but states that he would "pass out" if he kept walking. He stops to rest and feels better and is able to walk back to his home. He also complains of numbness in his feet with walking. He has shortness of breath that has been stable. He denies exertional chest pain or pressure. He has had worsening leg swelling of late.  The patient's last heart catheterization was in 2006 when he was noted to have minor nonobstructive coronary artery disease.  The patient's CT results from [facility] in 2012 demonstrated that his minimal iliofemoral diameter was 1.1 cm. His aortic valve annulus in systole measured 26 x 32 mm with an area of 6.9 cm. In diastole the valve annulus measured 6.6 cm.  Outpatient Encounter Prescriptions as of 09/08/2013  Medication Sig  . acetaminophen (ARTHRITIS PAIN RELIEF) 650 MG CR tablet Take 650 mg by mouth. Take 2 tabs AM and 1 tab PM   . allopurinol (ZYLOPRIM) 300 MG tablet Take 300 mg by mouth daily.    Marland Kitchen aspirin 81 MG chewable tablet Chew 81 mg by mouth daily.  . carbidopa-levodopa (SINEMET CR) 50-200 MG per tablet Take 1 tablet by mouth 3 (three) times daily.  . Cyanocobalamin (VITAMIN B-12 IJ) Inject as directed every 30 (thirty)  days.  . furosemide (LASIX) 80 MG tablet Take 1 tablet (80 mg total) by mouth daily.  Marland Kitchen levothyroxine (SYNTHROID) 175 MCG tablet Take 175 mcg by mouth daily.    Marland Kitchen LORazepam (ATIVAN) 1 MG tablet Take 1 tablet (1 mg total) by mouth 2 (two) times daily as needed for anxiety.  . metoprolol (TOPROL XL) 50 MG 24 hr tablet Take 50 mg by mouth daily.    . Misc Natural Products (OSTEO BI-FLEX ADV DOUBLE ST PO) Take by mouth. Take 2 tabs daily   . Omega-3 Fatty Acids (FISH OIL) 1000 MG CAPS Take by mouth. Take 1 capsule by mouth two times a day   . pantoprazole (PROTONIX) 40 MG tablet   . potassium chloride SA (K-DUR,KLOR-CON) 20 MEQ tablet TAKE 1 TABLET ONCE DAILY.  . rosuvastatin (CRESTOR) 10 MG tablet Take 10 mg by mouth daily.  . sucralfate (CARAFATE) 1 G tablet Take 1 g by mouth 2 (two) times daily.    Marland Kitchen warfarin (COUMADIN) 2.5 MG tablet Take 2.5 mg by mouth daily. Takes 2.5mg  (1 tablet) daily except 5mg  (2 tablets) on Monday and Friday    Allergies  Allergen Reactions  . Morphine And Related     Goes Crazy  . Nifedipine Other (See Comments)    "makes me go crazy" procardia    Past Medical History  Diagnosis Date  . Pulmonary embolus     bilateral  . Hypothyroid   . Hypertension   . CAD (coronary artery disease)   . Chronic  atrial fibrillation     INR therpeutic on Coumadin  . Dementia     hallucinations status post psychiatric evaluation during admission  . Kidney cyst, acquired   . A-fib   . CAD (coronary artery disease)     cath 2006, no stents, ejection fraction  . Leukocytosis   . Tremor   . Parkinson disease   . Severe aortic stenosis     ROS: Positive for gait instability, tremor, dementia, otherwise negative except as per HPI  BP 164/102  Pulse 85  Ht 6' (1.829 m)  Wt 298 lb (135.172 kg)  BMI 40.41 kg/m2  PHYSICAL EXAM: Pt is alert and oriented, obese, elderly male in NAD HEENT: normal Neck: JVP - normal, carotids delayed Lungs: CTA bilaterally CV:  Irregularly irregular with a grade 3/6 harsh systolic late peaking murmur at the right upper sternal border Abd: soft, NT, Positive BS, no hepatomegaly Ext: 1+ pretibial edema bilaterally, distal pulses intact and equal Skin: warm/dry no rash  EKG:  Atrial fibrillation 85 beats per minute, ST and T wave abnormality consider inferolateral and anterolateral ischemia  2-D echo: Left ventricle: The cavity size was normal. Wall thickness was increased in a pattern of moderate LVH. Systolic function was normal. The estimated ejection fraction was in the range of 50% to 55%.  ------------------------------------------------------------ Aortic valve: AV is thickened, calcified with restricted motion Leflets are difficult to see. Peak and mean gradients through the valve are 80 and 50 mm Hg respectively consistent with severe AS. Moderately thickened, moderately calcified leaflets. Doppler: No regurgitation. VTI ratio of LVOT to aortic valve: 0.14. Valve area: 0.76cm^2(VTI). Indexed valve area: 0.31cm^2/m^2 (VTI). Peak velocity ratio of LVOT to aortic valve: 0.16. Valve area: 0.83cm^2 (Vmax). Indexed valve area: 0.33cm^2/m^2 (Vmax). Mean gradient: 50mm Hg (S). Peak gradient: 79mm Hg (S).  ------------------------------------------------------------ Mitral valve: Calcified annulus. Mildly thickened leaflets . Doppler: Mild regurgitation. Peak gradient: 7mm Hg (D).  ------------------------------------------------------------ Left atrium: The atrium was severely dilated.  ------------------------------------------------------------ Right ventricle: The cavity size was normal. Wall thickness was normal. Systolic function was normal.  ------------------------------------------------------------ Pulmonic valve: Structurally normal valve. Cusp separation was normal. Doppler: Transvalvular velocity was within the normal range. Mild  regurgitation.  ------------------------------------------------------------ Tricuspid valve: Mildly thickened leaflets. Doppler: Mild-moderate regurgitation.  ------------------------------------------------------------ Right atrium: The atrium was moderately dilated.  ------------------------------------------------------------ Pericardium: There was no pericardial effusion.  ------------------------------------------------------------ Systemic veins: Inferior vena cava: The vessel was severely dilated.  ASSESSMENT AND PLAN: 1. Severe aortic stenosis. His last echocardiogram in June 2014 showed peak and mean transaortic valve gradients of 80 and 50 mmHg respectively. He clearly has symptomatic aortic stenosis with exertional presyncope. He has very severe aortic stenosis based on his gradients. With some progression of symptoms and worsening leg swelling, I am going to repeat his echocardiogram. We discussed definitive treatment options. He was deemed a poor surgical candidate back in 2012 but did not pursue TAVR because of limited prosthesis size availability at that time. I think TAVR would be a reasonable treatment alternative for this gentleman. The first should be to update his CT scan as it has been 3 years from his last study. Will start with a gated cardiac CT so that we can first verify he is treatable with currently available valves. Depending on the size of his annulus, he may need to be referred to a core valve site. After his cardiac CTA, will refer him to Dr Cornelius Moraswen for evaluation. Dr Cornelius Moraswen operated on the patient's wife a few years ago. Depending  on cardiac CTA findings, he will then need a repeat abdomen/pelvis CTA and heart catheterization. This was all discussed with the patient and his son today. The patient is very eager to seek treatment.  2. Permanent atrial fibrillation. He is continued on warfarin for anticoagulation. Heart rate is controlled on his current medical program  with metoprolol succinate.  3. Chronic diastolic heart failure. He is taking furosemide 80 mg every other day. Recommend increase to daily dosing. We'll check a metabolic panel today.  4. Hyperlipidemia. Maintain on Crestor 10 mg daily.  5. Hypertension. Blood pressure markedly elevated on arrival. I rechecked his blood pressure is 130/80.  Tonny Bollman 09/08/2013 1:59 PM

## 2013-09-11 ENCOUNTER — Other Ambulatory Visit: Payer: Self-pay | Admitting: *Deleted

## 2013-09-11 DIAGNOSIS — I359 Nonrheumatic aortic valve disorder, unspecified: Secondary | ICD-10-CM

## 2013-09-11 LAB — BASIC METABOLIC PANEL
BUN: 18 mg/dL (ref 6–23)
CALCIUM: 8.9 mg/dL (ref 8.4–10.5)
CO2: 28 meq/L (ref 19–32)
Chloride: 107 mEq/L (ref 96–112)
Creatinine, Ser: 1 mg/dL (ref 0.4–1.5)
GFR: 72.62 mL/min (ref >60.00)
GLUCOSE: 92 mg/dL (ref 70–99)
POTASSIUM: 4.1 meq/L (ref 3.5–5.1)
Sodium: 140 mEq/L (ref 135–145)

## 2013-09-13 ENCOUNTER — Ambulatory Visit (HOSPITAL_COMMUNITY)
Admission: RE | Admit: 2013-09-13 | Discharge: 2013-09-13 | Disposition: A | Discharge status: Home / Self Care | Payer: Medicare Other | Source: Ambulatory Visit | Attending: Cardiovascular Disease | Admitting: Cardiovascular Disease

## 2013-09-13 DIAGNOSIS — I517 Cardiomegaly: Secondary | ICD-10-CM | POA: Insufficient documentation

## 2013-09-13 DIAGNOSIS — I359 Nonrheumatic aortic valve disorder, unspecified: Secondary | ICD-10-CM

## 2013-09-13 MED ORDER — METOPROLOL TARTRATE 1 MG/ML IV SOLN
5.0000 mg | Freq: Once | INTRAVENOUS | Status: AC
  Administered 2013-09-13: 5 mg via INTRAVENOUS
  Filled 2013-09-13: qty 5

## 2013-09-13 MED ORDER — NITROGLYCERIN 0.4 MG SL SUBL
SUBLINGUAL_TABLET | SUBLINGUAL | Status: AC
  Filled 2013-09-13: qty 25

## 2013-09-13 MED ORDER — METOPROLOL TARTRATE 1 MG/ML IV SOLN
INTRAVENOUS | Status: AC
  Administered 2013-09-13: 5 mg via INTRAVENOUS
  Filled 2013-09-13: qty 5

## 2013-09-13 MED ORDER — METOPROLOL TARTRATE 1 MG/ML IV SOLN
INTRAVENOUS | Status: AC
  Filled 2013-09-13: qty 5

## 2013-09-13 MED ORDER — IOHEXOL 350 MG/ML SOLN
80.0000 mL | Freq: Once | INTRAVENOUS | Status: AC | PRN
  Administered 2013-09-13: 80 mL via INTRAVENOUS

## 2013-09-14 ENCOUNTER — Ambulatory Visit (HOSPITAL_COMMUNITY)
Admission: RE | Admit: 2013-09-14 | Discharge: 2013-09-14 | Disposition: A | Discharge status: Home / Self Care | Payer: Medicare Other | Source: Ambulatory Visit | Attending: Internal Medicine | Admitting: Internal Medicine

## 2013-09-14 DIAGNOSIS — Z86711 Personal history of pulmonary embolism: Secondary | ICD-10-CM | POA: Insufficient documentation

## 2013-09-14 DIAGNOSIS — I080 Rheumatic disorders of both mitral and aortic valves: Secondary | ICD-10-CM | POA: Insufficient documentation

## 2013-09-14 DIAGNOSIS — I379 Nonrheumatic pulmonary valve disorder, unspecified: Secondary | ICD-10-CM | POA: Insufficient documentation

## 2013-09-14 DIAGNOSIS — I079 Rheumatic tricuspid valve disease, unspecified: Secondary | ICD-10-CM | POA: Insufficient documentation

## 2013-09-14 DIAGNOSIS — I350 Nonrheumatic aortic (valve) stenosis: Secondary | ICD-10-CM

## 2013-09-14 DIAGNOSIS — R0602 Shortness of breath: Secondary | ICD-10-CM

## 2013-09-14 DIAGNOSIS — I359 Nonrheumatic aortic valve disorder, unspecified: Secondary | ICD-10-CM

## 2013-09-14 DIAGNOSIS — I10 Essential (primary) hypertension: Secondary | ICD-10-CM | POA: Insufficient documentation

## 2013-09-14 NOTE — Progress Notes (Signed)
2D Echo Performed 09/14/2013    [name] Graham, RCS  

## 2013-09-15 ENCOUNTER — Encounter: Payer: Self-pay | Admitting: Thoracic Surgery (Cardiothoracic Vascular Surgery)

## 2013-09-15 ENCOUNTER — Institutional Professional Consult (permissible substitution) (INDEPENDENT_AMBULATORY_CARE_PROVIDER_SITE_OTHER): Payer: Medicare Other | Admitting: Thoracic Surgery (Cardiothoracic Vascular Surgery)

## 2013-09-15 VITALS — BP 139/79 | HR 83 | Resp 20 | Ht 72.0 in | Wt 298.0 lb

## 2013-09-15 DIAGNOSIS — I5032 Chronic diastolic (congestive) heart failure: Secondary | ICD-10-CM | POA: Insufficient documentation

## 2013-09-15 DIAGNOSIS — Z789 Other specified health status: Secondary | ICD-10-CM

## 2013-09-15 DIAGNOSIS — Z6841 Body Mass Index (BMI) 40.0 and over, adult: Secondary | ICD-10-CM

## 2013-09-15 DIAGNOSIS — G629 Polyneuropathy, unspecified: Secondary | ICD-10-CM | POA: Insufficient documentation

## 2013-09-15 DIAGNOSIS — I350 Nonrheumatic aortic (valve) stenosis: Secondary | ICD-10-CM | POA: Insufficient documentation

## 2013-09-15 DIAGNOSIS — G609 Hereditary and idiopathic neuropathy, unspecified: Secondary | ICD-10-CM

## 2013-09-15 DIAGNOSIS — I359 Nonrheumatic aortic valve disorder, unspecified: Secondary | ICD-10-CM | POA: Insufficient documentation

## 2013-09-15 DIAGNOSIS — Z7409 Other reduced mobility: Secondary | ICD-10-CM | POA: Insufficient documentation

## 2013-09-15 DIAGNOSIS — I509 Heart failure, unspecified: Secondary | ICD-10-CM

## 2013-09-15 NOTE — Patient Instructions (Signed)

## 2013-09-15 NOTE — H&P (Addendum)
[facility]  MULTIDISCIPLINARY HEART VALVE CLINIC    CARDIOTHORACIC SURGERY CONSULTATION REPORT  Referring Provider is Tonny Bollman, MD PCP is Renae Fickle, MD  Chief Complaint  Patient presents with  . Aortic Stenosis    Surgical eval for possible TAVR, Cardiac CT 09/13/2013, ECHO 09/14/2013, Cardiac Cath pending    HPI:  Patient is a 80 year old morbidly obese, recently widowed white male from [place] with known history of severe symptomatic aortic stenosis, chronic diastolic congestive heart failure, permanent atrial fibrillation, and severe Parkinson's disease with mild dementia and limited physical mobility. The patient had been followed for years by Dr. [name].  Cardiac catheterization performed in 2006 was notable for the absence of significant coronary artery disease. His aortic stenosis gradually progressed over time.  He is felt to be relatively poor candidate for conventional surgical aortic valve replacement, and in 2012 he was referred to [facility] in [place] for possible transcatheter aortic valve replacement.  However, he was felt to have relatively large size aortic annulus, 2 large be treated with commercially available devices at that time. He was seen in followup recently by Dr. Excell Seltzer.  He reports chronic symptoms of exertional shortness of breath and bilateral lower extremity edema, and recently he has been having problems with increasing dizzy spells without syncope. Followup echocardiogram demonstrates further progression of the severity of aortic stenosis, now with peak velocity across the valve >4.5 m/sec and estimated mean transvalvular gradient greater than 50 mmHg.  Left ventricular systolic function is reasonably well preserved with ejection fraction estimated 55-60%.  He was referred for formal surgical consultation to discuss treatment options and cardiac gated CT angiogram of the heart was performed earlier this week.  The patient  lives with his son who is extremely supportive. I got know the patient and the whole family when I cared for the patient's wife in the past. The patient has suffered from Parkinson's disease for many years and has somewhat limited physical mobility. He ambulates using a cane at a slow pace for relatively short distances, although he states he can walk up to 300 feet. He also notes that his ambulation is limited as much by chronic numbness and weakness in his legs and feet as it is by exertional shortness of breath.  He describes chronic symptoms of exertional shortness of breath consistent with New York Heart Association functional class III CHF.  He denies any history resting shortness of breath, PND, orthopnea, chest pain or chest tightness. He has been having chronic dizzy spells and apparently nearly blacked out when he was in the hospital recently.  Past Medical History  Diagnosis Date  . Pulmonary embolus     bilateral  . Hypothyroid   . Hypertension   . Chronic atrial fibrillation     INR therpeutic on Coumadin  . Dementia     mild, with hallucinations status post psychiatric evaluation during admission  . Kidney cyst, acquired   . A-fib   . CAD (coronary artery disease)     non-obstructive by cath 2006, no stents, ejection fraction  . Leukocytosis   . Tremor   . Parkinson disease   . Severe aortic stenosis   . HYPERCHOLESTEROLEMIA  IIA 12/20/2008    Qualifier: Diagnosis of  By: [name], MD, Johny Sax   . Peripheral neuropathy   . Weakness of both legs 03/18/2012  . Macrocytic anemia 03/19/2012  . Urinary incontinence 03/28/2012  . Parkinson's disease dementia 01/20/2013  . Limited mobility   .  Morbid obesity with BMI of 40.0-44.9, adult   . Chronic diastolic congestive heart failure     Past Surgical History  Procedure Laterality Date  . Mole removakl      Family History  Problem Relation Age of Onset  . Heart failure Mother   . Microcephaly Father     History     Social History  . Marital Status: Married    Spouse Name: N/A    Number of Children: 6  . Years of Education: 12   Occupational History  . retired    Social History Main Topics  . Smoking status: Former Smoker -- 2.00 packs/day for 12 years    Types: Cigarettes  . Smokeless tobacco: Not on file  . Alcohol Use: No  . Drug Use: No  . Sexual Activity: Not on file   Other Topics Concern  . Not on file   Social History Narrative   PCP is Renae Fickle, Cardiologist is Dr. [name]. He receives Medicare. Power of [name] is his son: Flavius Repsher - [phone]. Caretaker is [name]: [phone].      Originally from [place], completed high school. 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 He currently lives with his wife in [place]. For the past 65 months, he had a home health nurse caring for him. His daughter has been living with he and his wife since his wife had a valve replacement in 6/13.      Has not smoked in 40 years.     Current Outpatient Prescriptions  Medication Sig Dispense Refill  . acetaminophen (ARTHRITIS PAIN RELIEF) 650 MG CR tablet Take 650 mg by mouth. Take 2 tabs AM and 1 tab PM       . allopurinol (ZYLOPRIM) 300 MG tablet Take 300 mg by mouth daily.        Marland Kitchen aspirin 81 MG chewable tablet Chew 81 mg by mouth daily.      . carbidopa-levodopa (SINEMET CR) 50-200 MG per tablet Take 1 tablet by mouth 3 (three) times daily.  90 tablet  0  . Cyanocobalamin (VITAMIN B-12 IJ) Inject as directed every 30 (thirty) days.      . furosemide (LASIX) 80 MG tablet Take 1 tablet (80 mg total) by mouth daily.  30 tablet  1  . levothyroxine (SYNTHROID) 175 MCG tablet Take 175 mcg by mouth daily.        Marland Kitchen LORazepam (ATIVAN) 1 MG tablet Take 1 tablet (1 mg total) by mouth 2 (two) times daily as needed for anxiety.  30 tablet  0  . metoprolol (TOPROL XL) 50 MG 24 hr tablet Take 50 mg by mouth daily.        . Misc Natural  Products (OSTEO BI-FLEX ADV DOUBLE ST PO) Take by mouth. Take 2 tabs daily       . Omega-3 Fatty Acids (FISH OIL) 1000 MG CAPS Take by mouth. Take 1 capsule by mouth two times a day       . pantoprazole (PROTONIX) 40 MG tablet       . potassium chloride SA (K-DUR,KLOR-CON) 20 MEQ tablet TAKE 1 TABLET ONCE DAILY.  30 tablet  0  . rosuvastatin (CRESTOR) 10 MG tablet Take 10 mg by mouth daily.      . sucralfate (CARAFATE) 1 G tablet Take 1 g by mouth 2 (two) times daily.        Marland Kitchen warfarin (COUMADIN) 2.5 MG tablet  Take 2.5 mg by mouth daily. Takes 2.5mg  (1 tablet) daily except 5mg  (2 tablets) on Monday and Friday       No current facility-administered medications for this visit.    Allergies  Allergen Reactions  . Morphine And Related     Goes Crazy  . Nifedipine Other (See Comments)    "makes me go crazy" procardia      Review of Systems:   General:  normal appetite, decreased energy, + weight gain, no weight loss, no fever  Cardiac:  no chest pain with exertion, no chest pain at rest, + SOB with exertion, no resting SOB, no PND, no orthopnea, no palpitations, + arrhythmia, + atrial fibrillation, + LE edema, + dizzy spells, no syncope  Respiratory:  + shortness of breath, no home oxygen, no productive cough, no dry cough, no bronchitis, no wheezing, no hemoptysis, no asthma, no pain with inspiration or cough, no sleep apnea, no CPAP at night  GI:   no difficulty swallowing, no reflux, no frequent heartburn, no hiatal hernia, no abdominal pain, + chronic constipation, no diarrhea, no hematochezia, no hematemesis, no melena  GU:   no dysuria,  + frequency, no urinary tract infection, no hematuria, + enlarged prostate, no kidney stones, no kidney disease  Vascular:  no pain suggestive of claudication, + pain in feet, no leg cramps, + varicose veins, + DVT, no non-healing foot ulcer  Neuro:   no stroke, + TIA's, no seizures, no headaches, no temporary blindness one eye,  no slurred speech, +  peripheral neuropathy, + chronic pain, + instability of gait, + memory/cognitive dysfunction  Musculoskeletal: + arthritis, no joint swelling, no myalgias, + difficulty walking, limited mobility   Skin:   + rash both lower legs, no itching, no skin infections, no pressure sores or ulcerations  Psych:   no anxiety, + depression, no nervousness, no unusual recent stress  Eyes:   no blurry vision, no floaters, no recent vision changes, + wears glasses or contacts  ENT:   no hearing loss, no loose or painful teeth, no dentures, last saw dentist > 3 years ago  Hematologic:  + easy bruising, no abnormal bleeding, no clotting disorder, no frequent epistaxis  Endocrine:  no diabetes, does not check CBG's at home           Physical Exam:   BP 139/79  Pulse 83  Resp 20  Ht 6' (1.829 m)  Wt 298 lb (135.172 kg)  BMI 40.41 kg/m2  SpO2 96%  General:  Morbidly obese and chronically ill-appearing  HEENT:  Unremarkable   Neck:   no JVD, no bruits, no adenopathy   Chest:   clear to auscultation, symmetrical breath sounds, no wheezes, no rhonchi   CV:   Irregular rate and rhythm, grade IV/VI crescendo/decrescendo murmur heard best at RSB,  no diastolic murmur  Abdomen:  soft, non-tender, no masses   Extremities:  warm, well-perfused, pulses not palpable, + severe LE edema  Rectal/GU  Deferred  Neuro:   Grossly non-focal and symmetrical throughout  Skin:   Clean and dry, no rashes, no breakdown   Diagnostic Tests:  Transthoracic Echocardiography  Patient:    [name] Graham, [name] Graham MR #:       [id number] Study Date: 09/14/2013 Gender:     M Age:        86 Height:     182.9cm Weight:     135.2kg BSA:        2.60m^2 Pt. Status: Room:  ORDERING     Azzie Glatter  ATTENDING    Zoila Shutter  SONOGRAPHER  Clearence Ped, RCS  PERFORMING   [facility], Outpatient cc:  ------------------------------------------------------------ LV EF: 55% -    60%  ------------------------------------------------------------ Indications:      424.1 Aortic valve disorders.  ------------------------------------------------------------ History:   PMH:  pulmonary embolus  Risk factors: Hypertension.  ------------------------------------------------------------ Study Conclusions  - Left ventricle: Systolic function was normal. The   estimated ejection fraction was in the range of 55% to   60%. Wall motion was normal; there were no regional wall   motion abnormalities. Doppler parameters are consistent   with a reversible restrictive pattern, indicative of   decreased left ventricular diastolic compliance and/or   increased left atrial pressure (grade 3 diastolic   dysfunction). - Aortic valve: Valve mobility was restricted. There was   severe stenosis. Peak velocity: 457cm/s (S). Mean   gradient: 52mm Hg (S). Valve area: 0.85cm^2(VTI). Valve   area: 0.86cm^2 (Vmax). - Aorta: Aortic root dimension: 43mm (ED). - Mitral valve: Calcified annulus. Mild regurgitation. Valve   area by continuity equation (using LVOT flow): 3.45cm^2. - Left atrium: The atrium was severely dilated.   Anterior-posterior dimension: 64mm (2D). - Right ventricle: The cavity size was mildly dilated. Wall   thickness was normal. - Right atrium: The atrium was mildly dilated. - Tricuspid valve: Moderate regurgitation. - Pulmonary arteries: Systolic pressure was mildly   increased. PA peak pressure: 44mm Hg (S). Impressions:  - Aortic stenosis in 6/14 was severe with peak velocity   ranging 4.3-4.46m/s (stable when compared to current   study). Echocardiography.  M-mode, complete 2D, spectral Doppler, and color Doppler.  Height:  Height: 182.9cm. Height: 72in. Weight:  Weight: 135.2kg. Weight: 297.4lb.  Body mass index:  BMI: 40.4kg/m^2.  Body surface area:    BSA: 2.18m^2. Blood pressure:     137/103.  Patient status:  Outpatient. Location:  Echo  laboratory.  ------------------------------------------------------------  ------------------------------------------------------------ Left ventricle:  Systolic function was normal. The estimated ejection fraction was in the range of 55% to 60%. Wall motion was normal; there were no regional wall motion abnormalities. Doppler parameters are consistent with a reversible restrictive pattern, indicative of decreased left ventricular diastolic compliance and/or increased left atrial pressure (grade 3 diastolic dysfunction).  ------------------------------------------------------------ Aortic valve:   Severely thickened, severely calcified leaflets. Cusp separation was normal. Valve mobility was restricted.  Doppler:   There was severe stenosis.    No regurgitation.    VTI ratio of LVOT to aortic valve: 0.16. Valve area: 0.85cm^2(VTI). Indexed valve area: 0.34cm^2/m^2 (VTI). Peak velocity ratio of LVOT to aortic valve: 0.16. Valve area: 0.86cm^2 (Vmax). Indexed valve area: 0.34cm^2/m^2 (Vmax).    Mean gradient: 52mm Hg (S). Peak gradient: 84mm Hg (S).  ------------------------------------------------------------ Aorta:  Ascending aorta: The ascending aorta was mildly dilated. Moderate calcification.  ------------------------------------------------------------ Mitral valve:   Calcified annulus. Leaflet separation was normal.  Doppler:  Transvalvular velocity was within the normal range. There was no evidence for stenosis.  Mild regurgitation.    Valve area by pressure half-time: 2.65cm^2. Indexed valve area by pressure half-time: 1.05cm^2/m^2. Valve area by continuity equation (using LVOT flow): 3.45cm^2. Indexed valve area by continuity equation (using LVOT flow): 1.37cm^2/m^2.    Mean gradient: 4mm Hg (D). Peak gradient: 14mm Hg (D).  ------------------------------------------------------------ Left atrium:  LA volume/ BSA = 84.5 ml/m2 The atrium was severely  dilated.  ------------------------------------------------------------ Right ventricle:  The cavity size was mildly  dilated. Wall thickness was normal. Systolic function was normal.  ------------------------------------------------------------ Pulmonic valve:    Structurally normal valve.   Cusp separation was normal.  Doppler:  Transvalvular velocity was within the normal range.  Mild regurgitation.  ------------------------------------------------------------ Tricuspid valve:   Structurally normal valve.   Leaflet separation was normal.  Doppler:  Transvalvular velocity was within the normal range.  Moderate regurgitation.  ------------------------------------------------------------ Pulmonary artery:   The main pulmonary artery was normal-sized. Systolic pressure was mildly increased.  ------------------------------------------------------------ Right atrium:  The atrium was mildly dilated.  ------------------------------------------------------------ Pericardium:  The pericardium was normal in appearance. There was no pericardial effusion.  ------------------------------------------------------------ Systemic veins: Inferior vena cava: The vessel was normal in size; the respirophasic diameter changes were in the normal range (= 50%); findings are consistent with normal central venous pressure. Diameter: 14mm.  ------------------------------------------------------------  2D measurements        Normal  Doppler measurements   Normal IVC                            Main pulmonary Diam         14 mm     ------  artery Left ventricle                 Pressure,    44 mm Hg  =30 LVID ED,   53.5 mm     43-52   S chord,                         Left ventricle PLAX                           Ea, lat     9.3 cm/s   ------ LVID ES,     38 mm     23-38   ann, tiss chord,                         DP PLAX                           E/Ea, lat  16.1        ------ FS, chord,   29 %      >29      ann, tiss     3 PLAX                           DP LVPW, ED   15.7 mm     ------  Ea, med     7.3 cm/s   ------ IVS/LVPW   1.08        <1.3    ann, tiss ratio, ED                      DP Ventricular septum             E/Ea, med  20.5        ------ IVS, ED    16.9 mm     ------  ann, tiss     5 LVOT                           DP Diam, S      26 mm     ------  LVOT Area       5.31 cm^2   ------  Peak vel,  73.7 cm/s   ------ Aorta                          S Root diam,   43 mm     ------  VTI, S     18.5 cm     ------ ED                             Aortic valve Left atrium                    Peak vel,   457 cm/s   ------ AP dim       64 mm     ------  S AP dim     2.54 cm/m^2 <2.2    Mean vel,   340 cm/s   ------ index                          S Right ventricle                VTI, S      115 cm     ------ RVID ED,   44.9 mm     19-38   Mean         52 mm Hg  ------ PLAX                           gradient,                                S                                Peak         84 mm Hg  ------                                gradient,                                S                                VTI ratio  0.16        ------                                LVOT/AV                                Area, VTI  0.85 cm^2   ------                                Area index 0.34 cm^2/m ------                                (  VTI)           ^2                                Peak vel   0.16        ------                                ratio,                                LVOT/AV                                Area, Vmax 0.86 cm^2   ------                                Area index 0.34 cm^2/m ------                                (Vmax)          ^2                                Mitral valve                                Peak E vel  150 cm/s   ------                                Mean vel,  90.6 cm/s   ------                                D                                Decelerati   162 ms     150-23                                on time                0                                Pressure     83 ms     ------                                half-time                                Mean          4 mm Hg  ------  gradient,                                D                                Peak         14 mm Hg  ------                                gradient,                                D                                Area (PHT) 2.65 cm^2   ------                                Area index 1.05 cm^2/m ------                                (PHT)           ^2                                Area       3.45 cm^2   ------                                (LVOT)                                continuity                                Area index 1.37 cm^2/m ------                                (LVOT           ^2                                cont)                                Annulus    28.5 cm     ------                                VTI                                Tricuspid valve  Regurg      331 cm/s   ------                                peak vel                                Peak RV-RA   44 mm Hg  ------                                gradient,                                S                                Systemic veins                                Estimated     0 mm Hg  ------                                CVP                                Right ventricle                                Pressure,    44 mm Hg  <30                                S                                Sa vel,     9.7 cm/s   ------                                lat ann,                                tiss DP   ------------------------------------------------------------ Prepared and Electronically Authenticated by  Donato Schultz 2015-02-12T09:[id number]    Cardiac TAVR CT   TECHNIQUE: The patient was  scanned on a Philips 256 scanner. A 120 kV retrospective scan was triggered in the descending thoracic aorta at 111 HU's. Gantry rotation speed was 270 msecs and collimation was .9 mm. No beta blockade or nitro were given. The 3D data set was reconstructed in 5% intervals of the R-R cycle. Systolic and diastolic phases were analyzed on a dedicated work station using MPR, MIP and VRT modes. The patient received 80 cc of contrast.   FINDINGS: Aortic Valve: Calcified, with limited but somewhat preserved leaflet opening   Aorta: Minimal calcifications of the visualized portion of the ascending thoracic aorta.   Sinotubular Junction:  35 x 32 mm   Ascending Thoracic Aorta:  41 x 40 mm, normal   Sinus of Valsalva Measurements:   Non-coronary:  42 mm   Right -coronary:  40 mm   Left -coronary:  41 mm   Coronary Artery Height above Annulus:   Left Main:  13 mm   Right Coronary:  17 mm   Virtual Basal Annulus Measurements:   Maximum/Minimum Diameter:  28 x 35 mm   Perimeter:  119 mm   Area:  7.82 cm2   Coronary Arteries: Normal origin, right dominance. The current study is of limited quality and doesn't allow further evaluation (no NTG used, patient's size).   Optimum Fluoroscopic Angle for Delivery: n/a   IMPRESSION: 1. Calcified aortic valve with restricted leaflet opening and measurements as above.   1. Aortic valve size is prohibitive for a TAVR procedure using currently available Edward-Sapien valves (annulus too large for a 29 mm Edward-Sapient valve). The patient should be considered for a 31 mm CoreValve (Medtronic), however might be too large for this type of valve as well.   Tobias AlexanderKatarina Nelson     Electronically Signed   By: Tobias AlexanderKatarina  Nelson   On: 09/14/2013 19:59       Study Result    EXAM: OVER-READ INTERPRETATION  CT CHEST   The following report is an over-read performed by radiologist Dr. Noe GensKevin Doverof [facility] on 09/13/2013. This  over-read does not include interpretation of cardiac or coronary anatomy or pathology. The coronary CTA interpretation by the cardiologist is attached.   COMPARISON:  CT CHEST W/O CM dated 08/25/2007; DG CHEST 2 VIEW dated 03/18/2012   FINDINGS: There is minimal dependent bibasilar atelectasis. Lungs are otherwise clear. No effusions.   No mediastinal, hilar, or axillary adenopathy. Chest wall soft tissues are unremarkable. Mild cardiomegaly. Imaging into the upper abdomen shows no acute findings.   No acute bony abnormality. Degenerative spurring throughout the thoracic spine.   IMPRESSION: No acute extracardiac abnormality.   Cardiomegaly.   Electronically Signed: By: Charlett NoseKevin  Dover M.D. On: 09/13/2013 13:26     STS Risk Calculator  Procedure    AVR  Risk of Mortality   6.2% Morbidity or Mortality  35.2% Prolonged LOS   16.3% Short LOS    13.2% Permanent Stroke   2.4% Prolonged Vent Support  29.3% DSW Infection    0.8% Renal Failure    15.2% Reoperation    10.1%    Impression:  The patient has severe symptomatic aortic stenosis with mild left ventricular systolic dysfunction, chronic persistent atrial fibrillation, history of multiple bilateral pulmonary emboli in the past on chronic Coumadin therapy, and long-standing Parkinson's disease. The patient has been quite limited by his Parkinson's disease and morbid obesity for many years, and as a result I feel that risks associated with conventional surgical aortic valve replacement would be very high, probably much higher than that predicted using the STS risk model.  I suspect that risk of mortality with conventional surgical aortic valve replacement would be greater than 15%, and perhaps more importantly the risk of permanent morbidity would likely be greater than 50%.  I would be very reluctant to consider him a candidate for conventional surgical aortic valve replacement.   Transcatheter aortic valve replacement would  be a very attractive option for long-term management of severe aortic stenosis.  Unfortunately, cardiac gated CT angiogram of the heart demonstrates the presence of a relatively large aortic valve, potentially too large be treated with currently available  transcatheter devices.  On a side note, the patient has poor dentition and has not seen a dentist in several years.   Plan:  The patient will be referred for dental service consultation.  The patient and his son have both been counseled regarding the importance of dental hygiene and routine followups in the setting of underlying valvular heart disease.  Repeat left and right heart catheterization and CT angiography of the aorta would be necessary if further surgical intervention were to be planned in the near future. However, prior to doing any more testing we will need to look into whether or not there may be a transcatheter device in ongoing clinical trials that could be used for this patient with a relatively large aortic annulus. The patient will be followed in the multidisciplinary [facility].    Salvatore Decent. Cornelius Moras, MD 09/15/2013 2:21 PM

## 2013-09-25 ENCOUNTER — Encounter (HOSPITAL_COMMUNITY): Payer: Self-pay | Admitting: Dentistry

## 2013-09-25 ENCOUNTER — Telehealth: Payer: Self-pay | Admitting: Cardiovascular Disease

## 2013-09-25 ENCOUNTER — Ambulatory Visit (HOSPITAL_COMMUNITY): Payer: Self-pay | Admitting: Dentistry

## 2013-09-25 VITALS — BP 156/93 | HR 74 | Temp 97.5°F

## 2013-09-25 DIAGNOSIS — K031 Abrasion of teeth: Secondary | ICD-10-CM

## 2013-09-25 DIAGNOSIS — I359 Nonrheumatic aortic valve disorder, unspecified: Secondary | ICD-10-CM

## 2013-09-25 DIAGNOSIS — K0530 Chronic periodontitis, unspecified: Secondary | ICD-10-CM

## 2013-09-25 DIAGNOSIS — Z0189 Encounter for other specified special examinations: Secondary | ICD-10-CM

## 2013-09-25 DIAGNOSIS — K029 Dental caries, unspecified: Secondary | ICD-10-CM

## 2013-09-25 DIAGNOSIS — K045 Chronic apical periodontitis: Secondary | ICD-10-CM

## 2013-09-25 DIAGNOSIS — K08109 Complete loss of teeth, unspecified cause, unspecified class: Secondary | ICD-10-CM

## 2013-09-25 DIAGNOSIS — M264 Malocclusion, unspecified: Secondary | ICD-10-CM

## 2013-09-25 DIAGNOSIS — K036 Deposits [accretions] on teeth: Secondary | ICD-10-CM

## 2013-09-25 DIAGNOSIS — IMO0002 Reserved for concepts with insufficient information to code with codable children: Secondary | ICD-10-CM

## 2013-09-25 NOTE — Progress Notes (Signed)
DENTAL CONSULTATION  Date of Consultation:  09/25/2013 Patient Name:   [name] Graham Date of Birth:   Dec 02, 1928 Medical Record Number: [id number]  VITALS: BP 156/93  Pulse 74  Temp(Src) 97.5 F (36.4 C) (Oral)   CHIEF COMPLAINT: The patient was referred by Dr. Tressie Stalker for a pre-heart valve surgery dental protocol examination.  HPI: [name] Graham is an 80 year old male with severe aortic stenosis. Patient with anticipated aortic valve replacement in the future. Patient is now seen as part of a pre-heart valve surgery dental protocol examination rule out dental infection that may affect the patient's systemic health anticipated heart valve surgery and put the patient at risk for endocarditis.  The patient currently denies acute toothache, swellings, or abscesses. Patient was last seen approximately 2-3 years ago to have several teeth pulled. Patient indicates that he went to the [place] [facility] to have these dental extractions. Patient did have some bleeding complications after the surgery by his report.  The patient was on warfarin therapy at that time.  Patient has not had his teeth cleaned in "a long time". Patient does not have partial dentures.  The patient does not have a regular primary dentist.  PROBLEM LIST: Patient Active Problem List   Diagnosis Date Noted  . Severe aortic stenosis 09/15/2013  . Aortic valve disorders 09/15/2013  . Peripheral neuropathy   . Limited mobility   . Morbid obesity with BMI of 40.0-44.9, adult   . Chronic diastolic congestive heart failure   . Parkinson's disease dementia 01/20/2013  . Abscess, gluteal, left 03/29/2012  . UTI (lower urinary tract infection) 03/28/2012  . Urinary incontinence 03/28/2012  . Parkinson's disease 03/21/2012  . Macrocytic anemia 03/19/2012  . Weakness of both legs 03/18/2012  . Syncope and collapse 01/22/2012  . Left sided numbness 11/14/2011  . TIA (transient ischemic  attack) 09/13/2011  . Coronary artery aneurysm 07/18/2011  . HYPERCHOLESTEROLEMIA  IIA 12/20/2008  . HYPERTENSION, BENIGN 12/20/2008  . History of pulmonary embolus (PE) 12/20/2008  . Aortic stenosis, severe 12/20/2008  . Atrial fibrillation 12/20/2008  . DEEP VENOUS THROMBOPHLEBITIS, CHRONIC 12/20/2008    PMH: Past Medical History  Diagnosis Date  . Pulmonary embolus     bilateral  . Hypothyroid   . Hypertension   . Chronic atrial fibrillation     INR therpeutic on Coumadin  . Dementia     mild, with hallucinations status post psychiatric evaluation during admission  . Kidney cyst, acquired   . A-fib   . CAD (coronary artery disease)     non-obstructive by cath 2006, no stents, ejection fraction  . Leukocytosis   . Tremor   . Parkinson disease   . Severe aortic stenosis   . HYPERCHOLESTEROLEMIA  IIA 12/20/2008    Qualifier: Diagnosis of  By: [name], MD, Johny Sax   . Peripheral neuropathy   . Weakness of both legs 03/18/2012  . Macrocytic anemia 03/19/2012  . Urinary incontinence 03/28/2012  . Parkinson's disease dementia 01/20/2013  . Limited mobility   . Morbid obesity with BMI of 40.0-44.9, adult   . Chronic diastolic congestive heart failure     PSH: Past Surgical History  Procedure Laterality Date  . Mole removal      ALLERGIES: Allergies  Allergen Reactions  . Morphine And Related     Goes Crazy  . Nifedipine Other (See Comments)    "makes me go crazy" procardia    MEDICATIONS: Current Outpatient Prescriptions  Medication  Sig Dispense Refill  . acetaminophen (ARTHRITIS PAIN RELIEF) 650 MG CR tablet Take 650 mg by mouth. Take 2 tabs AM and 1 tab PM       . allopurinol (ZYLOPRIM) 300 MG tablet Take 300 mg by mouth daily.        Marland Kitchen aspirin 81 MG chewable tablet Chew 81 mg by mouth daily.      . carbidopa-levodopa (SINEMET CR) 50-200 MG per tablet Take 1 tablet by mouth 3 (three) times daily.  90 tablet  0  . Cyanocobalamin (VITAMIN B-12 IJ)  Inject as directed every 30 (thirty) days.      . furosemide (LASIX) 80 MG tablet Take 1 tablet (80 mg total) by mouth daily.  30 tablet  1  . levothyroxine (SYNTHROID) 175 MCG tablet Take 175 mcg by mouth daily.        Marland Kitchen LORazepam (ATIVAN) 1 MG tablet Take 1 tablet (1 mg total) by mouth 2 (two) times daily as needed for anxiety.  30 tablet  0  . metoprolol (TOPROL XL) 50 MG 24 hr tablet Take 50 mg by mouth daily.        . Misc Natural Products (OSTEO BI-FLEX ADV DOUBLE ST PO) Take by mouth. Take 2 tabs daily       . Omega-3 Fatty Acids (FISH OIL) 1000 MG CAPS Take by mouth. Take 1 capsule by mouth two times a day       . pantoprazole (PROTONIX) 40 MG tablet       . potassium chloride SA (K-DUR,KLOR-CON) 20 MEQ tablet TAKE 1 TABLET ONCE DAILY.  30 tablet  0  . rosuvastatin (CRESTOR) 10 MG tablet Take 10 mg by mouth daily.      . sucralfate (CARAFATE) 1 G tablet Take 1 g by mouth 2 (two) times daily.        Marland Kitchen warfarin (COUMADIN) 2.5 MG tablet Take 2.5 mg by mouth daily. Takes 2.5mg  (1 tablet) daily except 5mg  (2 tablets) on Monday and Friday       No current facility-administered medications for this visit.    LABS: Lab Results  Component Value Date   WBC 6.1 03/29/2012   HGB 11.6* 03/29/2012   HCT 34.3* 03/29/2012   MCV 96.1 03/29/2012   PLT 190 03/29/2012      Component Value Date/Time   NA 140 09/08/2013 1243   K 4.1 09/08/2013 1243   CL 107 09/08/2013 1243   CO2 28 09/08/2013 1243   GLUCOSE 92 09/08/2013 1243   BUN 18 09/08/2013 1243   CREATININE 1.0 09/08/2013 1243   CALCIUM 8.9 09/08/2013 1243   GFRNONAA 76* 03/29/2012 0509   GFRAA 88* 03/29/2012 0509   Lab Results  Component Value Date   INR 2.49* 03/29/2012   INR 2.95* 03/28/2012   INR 3.10* 03/27/2012   No results found for this basename: PTT    SOCIAL HISTORY: History   Social History  . Marital Status: Married    Spouse Name: N/A    Number of Children: 6  . Years of Education: 12   Occupational History  . retired    Social  History Main Topics  . Smoking status: Former Smoker -- 2.00 packs/day for 12 years    Types: Cigarettes  . Smokeless tobacco: Never Used     Comment: Quit 40 years ago by report.  . Alcohol Use: No  . Drug Use: No  . Sexual Activity: Not on file   Other Topics Concern  . Not  on file   Social History Narrative   PCP is Renae Fickle, Cardiologist is Dr. Excell Seltzer. He receives Medicare.    Power of [name] is his son: Clary Meeker - [phone].    Caretaker is [name]: [phone].      Originally from [place], completed high school. 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    He then lived with wife in [place] after her heart valve replacement.    Wife passed away on 08/27/2013. He now lives in [place], [place] again.   He had a home health nurse caring for him. His daughter had been living with he and his wife since his wife had a valve replacement in 6/13.      Has not smoked in 40 years.     FAMILY HISTORY: Family History  Problem Relation Age of Onset  . Heart failure Mother   . Microcephaly Father      REVIEW OF SYSTEMS: Reviewed with the patient included in dental record.  DENTAL HISTORY: CHIEF COMPLAINT: The patient was referred by Dr. Tressie Stalker for a pre-heart valve surgery dental protocol examination.  HPI: DASH CARDARELLI is an 80 year old male with severe aortic stenosis. Patient with anticipated aortic valve replacement in the future. Patient is now seen as part of a pre-heart valve surgery dental protocol examination rule out dental infection that may affect the patient's systemic health anticipated heart valve surgery and put the patient at risk for endocarditis.  The patient currently denies acute toothache, swellings, or abscesses. Patient was last seen approximately 2-3 years ago to have several teeth pulled. Patient indicates that he went to the [place] [facility] to have  these dental extractions. Patient did have some bleeding complications after the surgery by his report.  The patient was on warfarin therapy at that time.  Patient has not had his teeth cleaned in "a long time". Patient does not have partial dentures.  The patient does not have a regular primary dentist.  DENTAL EXAMINATION:  GENERAL: The patient is a well-developed, obese male in no acute distress. The patient walks with the aid of a cane.  HEAD AND NECK: There is no palpable submandibular lymphadenopathy. The patient denies acute TMJ symptoms. INTRAORAL EXAM: The patient has normal saliva. The patient has a mid palatal torus that is small.  DENTITION: The patient is missing tooth numbers 1, 2, 3, 4, 5, 10, 13, 14, 15, 16, 17, 19, 20, 28, 29, and 32. Tooth #8 is present as a retained root segment. PERIODONTAL: The patient has chronic periodontitis with plaque and calculus accumulations, generalized gingival recession, and some incipient mandibular anterior tooth mobility.  DENTAL CARIES/SUBOPTIMAL RESTORATIONS: The patient has multiple dental caries, suboptimal dental restorations, attrition, and abfraction lesions per dental charting form. ENDODONTIC: The patient denies acute toothache symptoms. Patient may have incipient periapical pathology associated with the retained root segment #8 CROWN AND BRIDGE:The patient has crowns on tooth numbers 12, 30, and 31. The crown tooth #30 is suboptimal secondary to extensive facial caries at the margin. PROSTHODONTIC: The patient does not have partial dentures.  OCCLUSION: The patient has a poor occlusal scheme secondary to multiple missing teeth, supra-eruption and drifting of the unopposed teeth into the edentulous areas, and lack of replacement of missing teeth with dental prostheses.  RADIOGRAPHIC INTERPRETATION:  An orthopantogram was taken and supplemented with 10 periapical radiographs.   There are multiple missing teeth. There is  a retained root  segment #8. There is moderate horizontal and vertical bone loss. There is incipient periapical radiolucency at the apex of tooth #8. There are dental caries noted. There are multiple radiolucent areas consistent with the abfraction lesions.    ASSESSMENTS: 1. Severe aortic stenosis 2. Pre-heart valve surgery dental protocol examination 3. Chronic apical periodontitis 4. Chronic periodontitis with bone loss 5. Generalized gingival recession 6. Accretions 7. Small mid palatal torus 8. Multiple dental caries and suboptimal dental restorations 9. Multiple abfraction and attrition lesions 10. Multiple missing teeth 11. No history of partial dentures 12. Malocclusion 13. Warfarin therapy with risk for bleeding with invasive dental procedures  14. Significant cardiovascular compromise with the risk for complications up to and including death with invasive dental procedures.    PLAN/RECOMMENDATIONS: 1. I discussed the risks, benefits, and complications of various treatment options with the patient and his son in relationship to his medical and dental conditions, anticipated aortic valve replacement, current warfarin therapy, and risk for endocarditis. We discussed various treatment options to include no treatment, multiple extractions with alveoloplasty, pre-prosthetic surgery as indicated, periodontal therapy, dental restorations, root canal therapy, crown and bridge therapy, implant therapy, and replacement of missing teeth as indicated.  We also discussed referral to an oral surgeon for surgical procedures as indicated.The patient is currently considering extraction of tooth numbers 8 and 30 with alveoloplasty and gross debridement of remaining dentition in the operating room.  The patient and son would like to coordinate this with the possible cardiac catheterization procedure with Dr. Excell Seltzerooper. If the cardiac catheterization is not been planned, the patient may consider referral to an oral surgeon  for these extraction procedures and followup with another primary dentist for the dental restorations, periodontal therapy, and evaluation for replacement of missing teeth as indicated.   2. Discussion of findings with medical team and coordination of future medical and dental care as needed.  I spent 60 minutes face to face with patient and more than 50% of time was spent in counseling and /or coordination of care.   Charlynne Panderonald F. Kulinski, DDS

## 2013-09-25 NOTE — Telephone Encounter (Signed)
New message    Returning Dr Earmon Phoenixooper's call

## 2013-09-26 NOTE — Patient Instructions (Signed)
The patient is currently thinking about his options at this time. We will discuss this again in the future once I have had time to discuss the plan of care with Dr. Tonny BollmanMichael Cooper and Dr. Tressie Stalkerlarence Owen.   Dr. Kristin BruinsKulinski

## 2013-09-27 NOTE — Telephone Encounter (Signed)
Spoke with patient's son, [name] and scheduled appointment with Dr. Excell Seltzerooper for 3/12 per Dr. Excell Seltzerooper to discuss options for aortic stenosis.  [name] verbalized understanding and agreement.

## 2013-10-12 ENCOUNTER — Encounter: Payer: Self-pay | Admitting: Cardiovascular Disease

## 2013-10-12 ENCOUNTER — Ambulatory Visit (INDEPENDENT_AMBULATORY_CARE_PROVIDER_SITE_OTHER): Payer: Medicare Other | Admitting: Cardiovascular Disease

## 2013-10-12 VITALS — BP 138/80 | HR 80 | Ht 72.0 in | Wt 293.8 lb

## 2013-10-12 DIAGNOSIS — I359 Nonrheumatic aortic valve disorder, unspecified: Secondary | ICD-10-CM

## 2013-10-12 NOTE — Patient Instructions (Signed)
Your physician recommends that you continue on your current medications as directed. Please refer to the Current Medication list given to you today.  Your physician recommends that you schedule a follow-up appointment in: 4 months with Dr. Excell Seltzerooper

## 2013-10-16 NOTE — Progress Notes (Signed)
HPI:   80 year-old gentleman presenting for followup discussion of treatment options for his severe aortic stenosis. Since he was last seen February 6th, 2015, he has had a gated cardiac CTA and has been seen by Dr Cornelius Moraswen for cardiac surgical evaluation. Briefly, the patient has multiple comorbid conditions including physical limitations related to Parkinson's disease, frailty, history of pulmonary emboli on chronic warfarin, and dementia. He reports stable symptoms of exertional dizziness without syncope. Denies chest pain or limiting shortness of breath.    Outpatient Encounter Prescriptions as of 10/12/2013  Medication Sig  . acetaminophen (ARTHRITIS PAIN RELIEF) 650 MG CR tablet Take 650 mg by mouth. Take 2 tabs AM and 1 tab PM   . allopurinol (ZYLOPRIM) 300 MG tablet Take 300 mg by mouth daily.    Marland Kitchen. aspirin 81 MG chewable tablet Chew 81 mg by mouth daily.  . carbidopa-levodopa (SINEMET CR) 50-200 MG per tablet Take 1 tablet by mouth 3 (three) times daily.  . Cyanocobalamin (VITAMIN B-12 IJ) Inject as directed every 30 (thirty) days.  . furosemide (LASIX) 80 MG tablet Take 1 tablet (80 mg total) by mouth daily.  Marland Kitchen. levothyroxine (SYNTHROID) 175 MCG tablet Take 175 mcg by mouth daily.    Marland Kitchen. LORazepam (ATIVAN) 1 MG tablet Take 1 tablet (1 mg total) by mouth 2 (two) times daily as needed for anxiety.  . metoprolol (TOPROL XL) 50 MG 24 hr tablet Take 50 mg by mouth daily.    . Misc Natural Products (OSTEO BI-FLEX ADV DOUBLE ST PO) Take by mouth. Take 2 tabs daily   . Omega-3 Fatty Acids (FISH OIL) 1000 MG CAPS Take by mouth. Take 1 capsule by mouth two times a day   . pantoprazole (PROTONIX) 40 MG tablet   . potassium chloride SA (K-DUR,KLOR-CON) 20 MEQ tablet TAKE 1 TABLET ONCE DAILY.  . pravastatin (PRAVACHOL) 20 MG tablet   . rosuvastatin (CRESTOR) 10 MG tablet Take 10 mg by mouth daily.  . sucralfate (CARAFATE) 1 G tablet Take 1 g by mouth 2 (two) times daily.    . tamsulosin (FLOMAX) 0.4  MG CAPS capsule   . warfarin (COUMADIN) 2.5 MG tablet Take 2.5 mg by mouth daily. Takes 2.5mg  (1 tablet) daily except 5mg  (2 tablets) on Monday and Friday    Allergies  Allergen Reactions  . Morphine And Related     Goes Crazy  . Nifedipine Other (See Comments)    "makes me go crazy" procardia    Past Medical History  Diagnosis Date  . Pulmonary embolus     bilateral  . Hypothyroid   . Hypertension   . Chronic atrial fibrillation     INR therpeutic on Coumadin  . Dementia     mild, with hallucinations status post psychiatric evaluation during admission  . Kidney cyst, acquired   . A-fib   . CAD (coronary artery disease)     non-obstructive by cath 2006, no stents, ejection fraction  . Leukocytosis   . Tremor   . Parkinson disease   . Severe aortic stenosis   . HYPERCHOLESTEROLEMIA  IIA 12/20/2008    Qualifier: Diagnosis of  By: Riley KillStuckey, MD, Johny SaxFACC, Thomas David   . Peripheral neuropathy   . Weakness of both legs 03/18/2012  . Macrocytic anemia 03/19/2012  . Urinary incontinence 03/28/2012  . Parkinson's disease dementia 01/20/2013  . Limited mobility   . Morbid obesity with BMI of 40.0-44.9, adult   . Chronic diastolic congestive heart failure  ROS: Negative except as per HPI  BP 138/80  Pulse 80  Ht 6' (1.829 m)  Wt 293 lb 12.8 oz (133.267 kg)  BMI 39.84 kg/m2  PHYSICAL EXAM: Pt is alert and oriented, elderly male in NAD HEENT: normal Neck: JVP - normal, carotids 1+= Lungs: CTA bilaterally CV: irregular with grade 3/6 systolic murmur at the RUSB Abd: soft, NT, Positive BS, no hepatomegaly Ext: mild pretibial edema bilaterally, distal pulses intact and equal Skin: warm/dry no rash  CT Heart: FINDINGS: Aortic Valve: Calcified, with limited but somewhat preserved leaflet opening  Aorta: Minimal calcifications of the visualized portion of the ascending thoracic aorta.  Sinotubular Junction: 35 x 32 mm  Ascending Thoracic Aorta: 41 x 40 mm,  normal  Sinus of Valsalva Measurements:  Non-coronary: 42 mm  Right -coronary: 40 mm  Left -coronary: 41 mm  Coronary Artery Height above Annulus:  Left Main: 13 mm  Right Coronary: 17 mm  Virtual Basal Annulus Measurements:  Maximum/Minimum Diameter: 28 x 35 mm  Perimeter: 119 mm  Area: 7.82 cm2  Coronary Arteries: Normal origin, right dominance. The current study is of limited quality and doesn't allow further evaluation (no NTG used, patient's size).  Optimum Fluoroscopic Angle for Delivery: n/a  IMPRESSION: 1. Calcified aortic valve with restricted leaflet opening and measurements as above.  1. Aortic valve size is prohibitive for a TAVR procedure using currently available Edward-Sapien valves (annulus too large for a 29 mm Edward-Sapient valve). The patient should be considered for a 31 mm CoreValve (Medtronic), however might be too large for this type of valve as well.  Tobias Alexander  ASSESSMENT AND PLAN: 80 year-old with medical problems outlined above who has severe symptomatic aortic stenosis. I have reviewed his imaging studies, discussed his case at length with Dr Cornelius Moras, and reviewed treatment options with colleagues at other centers ([facility], [facility]). Unfortunately Mr Fenlon has a very large aortic valve annulus which is not suitable for any TAVR valves either currently available or in development. As per Dr Orvan July note, I agree that he is not a suitable candidate for open surgical AVR. I had a frank discussion today with the patient and his son, Barbara Cower. His treatment options at this point are limited to medical therapy, with or without palliative balloon aortic valvuloplasty. Considering the stability of his symptoms and an absence of chest pain or lifestyle-limiting dyspnea, I favor medical therapy alone at this time. If he develops symptoms of angina or breathlessness, cardiac cath and BAV can be considered. The patient and his son are in agreement with this  plan. They understand the natural history of severe aortic stenosis, and specifically understand that symptoms will predictably worsen, likely over the next 1-2 years. I will continue to follow him closely and they will notify our office if there is any change in symptoms.   Tonny Bollman 10/16/2013 9:49 PM

## 2014-02-23 ENCOUNTER — Telehealth: Payer: Self-pay | Admitting: Internal Medicine

## 2014-02-23 ENCOUNTER — Encounter: Payer: Self-pay | Admitting: Cardiovascular Disease

## 2014-02-23 ENCOUNTER — Ambulatory Visit (INDEPENDENT_AMBULATORY_CARE_PROVIDER_SITE_OTHER): Payer: Medicare Other | Admitting: Cardiovascular Disease

## 2014-02-23 VITALS — BP 120/76 | HR 78 | Ht 72.0 in | Wt 288.1 lb

## 2014-02-23 DIAGNOSIS — I359 Nonrheumatic aortic valve disorder, unspecified: Secondary | ICD-10-CM

## 2014-02-23 DIAGNOSIS — I4891 Unspecified atrial fibrillation: Secondary | ICD-10-CM

## 2014-02-23 DIAGNOSIS — I482 Chronic atrial fibrillation, unspecified: Secondary | ICD-10-CM

## 2014-02-23 NOTE — Telephone Encounter (Signed)
I was called by the patient and his son.  [name] developed bilateral LE numbness from the waist down earlier this evening, which resolved after ~20 minutes.  This has happened in the past, though not for some time.  The patient otherwise felt well.  He and his son are unsure if he had any accompanying weakness, as he remained seated in a chair.  I advised the patient and his son that his symptoms could be due to an number of things, but a neurologic event such as TIA/CVA is certainly a possibility.  I recommended that he be evaluated in the ED, but the patient wished to wait to see if his symptoms return.  I advised them to call 911 if any new symptoms or concerns develop.  I will forward this message to Dr. Excell Seltzerooper for his review.

## 2014-02-23 NOTE — Patient Instructions (Signed)
Your physician has recommended you make the following change in your medication: STOP CRESTOR  Your physician wants you to follow-up in: 6 MONTH F/U WITH DR. Excell SeltzerOOPER.  You will receive a reminder letter in the mail two months in advance. If you don't receive a letter, please call our office to schedule the follow-up appointment @ [phone]

## 2014-02-23 NOTE — Progress Notes (Signed)
HPI:  80 year old gentleman presenting for followup evaluation. The patient has severe aortic stenosis but is not a candidate for surgical aortic valve replacement because of severe comorbidities medical conditions. He has Parkinson's disease, generalized frailty, and history of pulmonary emboli maintained on chronic warfarin. The patient also has dementia. He was not a candidate for transcatheter valve replacement because his aortic valve annulus is too large for any of the available TAVR valves.  Since I have last seen the patient, he reports stability in his symptoms. He actually is feeling better than he has in some time. Leg swelling is improved. He is able to do some light work without exertional symptoms. He has mild dizziness when he walks to his mailbox which is a few hundred feet. This symptom has not progressed. He's not had syncope. He denies chest pain or pressure.  Outpatient Encounter Prescriptions as of 02/23/2014  Medication Sig  . acetaminophen (ARTHRITIS PAIN RELIEF) 650 MG CR tablet Take 650 mg by mouth. Take 2 tabs AM and 1 tab PM   . allopurinol (ZYLOPRIM) 300 MG tablet Take 300 mg by mouth daily.    Marland Kitchen. aspirin 81 MG chewable tablet Chew 81 mg by mouth daily.  . Cyanocobalamin (VITAMIN B-12 IJ) Inject as directed every 30 (thirty) days.  . furosemide (LASIX) 80 MG tablet Take 1 tablet (80 mg total) by mouth daily.  Marland Kitchen. levothyroxine (SYNTHROID) 175 MCG tablet Take 175 mcg by mouth daily.    Marland Kitchen. LORazepam (ATIVAN) 1 MG tablet Take 1 tablet (1 mg total) by mouth 2 (two) times daily as needed for anxiety.  . metoprolol (TOPROL XL) 50 MG 24 hr tablet Take 50 mg by mouth daily.    . Misc Natural Products (OSTEO BI-FLEX ADV DOUBLE ST PO) Take by mouth. Take 2 tabs daily   . Omega-3 Fatty Acids (FISH OIL) 1000 MG CAPS Take by mouth. Take 1 capsule by mouth two times a day   . pantoprazole (PROTONIX) 40 MG tablet   . potassium chloride SA (K-DUR,KLOR-CON) 20 MEQ tablet TAKE 1 TABLET  ONCE DAILY.  . pravastatin (PRAVACHOL) 20 MG tablet   . rosuvastatin (CRESTOR) 10 MG tablet Take 10 mg by mouth daily.  . sucralfate (CARAFATE) 1 G tablet Take 1 g by mouth 2 (two) times daily.    . tamsulosin (FLOMAX) 0.4 MG CAPS capsule   . warfarin (COUMADIN) 2.5 MG tablet Take 2.5 mg by mouth daily. Takes 2.5mg  (1 tablet) daily except 5mg  (2 tablets) on Monday and Friday  . carbidopa-levodopa (SINEMET CR) 50-200 MG per tablet Take 1 tablet by mouth 3 (three) times daily.    Allergies  Allergen Reactions  . Morphine And Related     Goes Crazy  . Nifedipine Other (See Comments)    "makes me go crazy" procardia    Past Medical History  Diagnosis Date  . Pulmonary embolus     bilateral  . Hypothyroid   . Hypertension   . Chronic atrial fibrillation     INR therpeutic on Coumadin  . Dementia     mild, with hallucinations status post psychiatric evaluation during admission  . Kidney cyst, acquired   . A-fib   . CAD (coronary artery disease)     non-obstructive by cath 2006, no stents, ejection fraction  . Leukocytosis   . Tremor   . Parkinson disease   . Severe aortic stenosis   . HYPERCHOLESTEROLEMIA  IIA 12/20/2008    Qualifier: Diagnosis of  By: Riley KillStuckey,  MD, Ruthann Cancer [name]   . Peripheral neuropathy   . Weakness of both legs 03/18/2012  . Macrocytic anemia 03/19/2012  . Urinary incontinence 03/28/2012  . Parkinson's disease dementia 01/20/2013  . Limited mobility   . Morbid obesity with BMI of 40.0-44.9, adult   . Chronic diastolic congestive heart failure     ROS: Negative except as per HPI  BP 120/76  Pulse 78  Ht 6' (1.829 m)  Wt 288 lb 1.9 oz (130.69 kg)  BMI 39.07 kg/m2  PHYSICAL EXAM: Pt is alert and oriented, overweight, elderly male in NAD HEENT: normal Neck: JVP - normal, carotids 2+= with bilateral bruits Lungs: CTA bilaterally CV: irregular with grade 3/6 harsh systolic murmur heard throughout the precordium Abd: soft, NT, Positive BS, no  hepatomegaly Ext: no C/C/E, distal pulses intact and equal Skin: warm/dry no rash  EKG:  Atrial fibrillation 78 beats per minute, left ventricular hypertrophy, ST and T wave abnormality consider inferior and anterolateral ischemia.  2-D echocardiogram 09/14/2013: Study Conclusions  - Left ventricle: Systolic function was normal. The estimated ejection fraction was in the range of 55% to 60%. Wall motion was normal; there were no regional wall motion abnormalities. Doppler parameters are consistent with a reversible restrictive pattern, indicative of decreased left ventricular diastolic compliance and/or increased left atrial pressure (grade 3 diastolic dysfunction). - Aortic valve: Valve mobility was restricted. There was severe stenosis. Peak velocity: 457cm/s (S). Mean gradient: 52mm Hg (S). Valve area: 0.85cm^2(VTI). Valve area: 0.86cm^2 (Vmax). - Aorta: Aortic root dimension: 43mm (ED). - Mitral valve: Calcified annulus. Mild regurgitation. Valve area by continuity equation (using LVOT flow): 3.45cm^2. - Left atrium: The atrium was severely dilated. Anterior-posterior dimension: 64mm (2D). - Right ventricle: The cavity size was mildly dilated. Wall thickness was normal. - Right atrium: The atrium was mildly dilated. - Tricuspid valve: Moderate regurgitation. - Pulmonary arteries: Systolic pressure was mildly increased. PA peak pressure: 44mm Hg (S). Impressions:  - Aortic stenosis in 6/14 was severe with peak velocity ranging 4.3-4.79m/s (stable when compared to current study).  ASSESSMENT AND PLAN: 1. Severe aortic stenosis. The patient continues with palliative medical therapy. Fortunately his symptoms have not progressed and he is not particularly limited by cardiopulmonary symptoms at present. I will see him back in 6 months.  2. Permanent atrial fibrillation. He is tolerating anticoagulation with warfarin. He reports no bleeding problems. Heart rate is  controlled.  3. Chronic diastolic heart failure. Symptoms are stable, New York Heart Association functional class II. Leg swelling has resolved.  4. Hyperlipidemia. The patient is on to statin drugs. I asked him to discontinue Crestor and remain on pravastatin 20 mg daily.  5. Hypertension. Blood pressure is well controlled on Lasix and metoprolol.  For followup I will see him back in 6 months.  Tonny Bollman 02/23/2014 10:49 AM

## 2014-02-24 ENCOUNTER — Encounter (HOSPITAL_COMMUNITY): Payer: Self-pay | Admitting: Emergency Medicine

## 2014-02-24 ENCOUNTER — Emergency Department (HOSPITAL_COMMUNITY): Payer: Medicare Other

## 2014-02-24 ENCOUNTER — Emergency Department (HOSPITAL_COMMUNITY)
Admission: EM | Admit: 2014-02-24 | Discharge: 2014-02-24 | Disposition: A | Discharge status: Home / Self Care | Payer: Medicare Other | Attending: Emergency Medicine | Admitting: Emergency Medicine

## 2014-02-24 DIAGNOSIS — I5032 Chronic diastolic (congestive) heart failure: Secondary | ICD-10-CM | POA: Diagnosis not present

## 2014-02-24 DIAGNOSIS — K921 Melena: Secondary | ICD-10-CM | POA: Insufficient documentation

## 2014-02-24 DIAGNOSIS — Z87448 Personal history of other diseases of urinary system: Secondary | ICD-10-CM | POA: Diagnosis not present

## 2014-02-24 DIAGNOSIS — Z7982 Long term (current) use of aspirin: Secondary | ICD-10-CM | POA: Insufficient documentation

## 2014-02-24 DIAGNOSIS — R42 Dizziness and giddiness: Secondary | ICD-10-CM | POA: Insufficient documentation

## 2014-02-24 DIAGNOSIS — G3183 Dementia with Lewy bodies: Secondary | ICD-10-CM

## 2014-02-24 DIAGNOSIS — Z86711 Personal history of pulmonary embolism: Secondary | ICD-10-CM | POA: Insufficient documentation

## 2014-02-24 DIAGNOSIS — Z79899 Other long term (current) drug therapy: Secondary | ICD-10-CM | POA: Insufficient documentation

## 2014-02-24 DIAGNOSIS — Z8669 Personal history of other diseases of the nervous system and sense organs: Secondary | ICD-10-CM | POA: Diagnosis not present

## 2014-02-24 DIAGNOSIS — I2510 Atherosclerotic heart disease of native coronary artery without angina pectoris: Secondary | ICD-10-CM | POA: Diagnosis not present

## 2014-02-24 DIAGNOSIS — E119 Type 2 diabetes mellitus without complications: Secondary | ICD-10-CM | POA: Diagnosis not present

## 2014-02-24 DIAGNOSIS — I4891 Unspecified atrial fibrillation: Secondary | ICD-10-CM | POA: Insufficient documentation

## 2014-02-24 DIAGNOSIS — E7800 Pure hypercholesterolemia, unspecified: Secondary | ICD-10-CM | POA: Insufficient documentation

## 2014-02-24 DIAGNOSIS — R209 Unspecified disturbances of skin sensation: Secondary | ICD-10-CM

## 2014-02-24 DIAGNOSIS — R5383 Other fatigue: Secondary | ICD-10-CM

## 2014-02-24 DIAGNOSIS — IMO0002 Reserved for concepts with insufficient information to code with codable children: Secondary | ICD-10-CM | POA: Insufficient documentation

## 2014-02-24 DIAGNOSIS — R5381 Other malaise: Secondary | ICD-10-CM

## 2014-02-24 DIAGNOSIS — Z87891 Personal history of nicotine dependence: Secondary | ICD-10-CM | POA: Diagnosis not present

## 2014-02-24 DIAGNOSIS — E039 Hypothyroidism, unspecified: Secondary | ICD-10-CM | POA: Insufficient documentation

## 2014-02-24 DIAGNOSIS — F0280 Dementia in other diseases classified elsewhere without behavioral disturbance: Secondary | ICD-10-CM | POA: Insufficient documentation

## 2014-02-24 DIAGNOSIS — Z862 Personal history of diseases of the blood and blood-forming organs and certain disorders involving the immune mechanism: Secondary | ICD-10-CM | POA: Insufficient documentation

## 2014-02-24 DIAGNOSIS — Z8739 Personal history of other diseases of the musculoskeletal system and connective tissue: Secondary | ICD-10-CM | POA: Insufficient documentation

## 2014-02-24 DIAGNOSIS — R200 Anesthesia of skin: Secondary | ICD-10-CM

## 2014-02-24 LAB — I-STAT CHEM 8, ED
BUN: 27 mg/dL — AB (ref 6–23)
CREATININE: 1.1 mg/dL (ref 0.50–1.35)
Calcium, Ion: 1.19 mmol/L (ref 1.13–1.30)
Chloride: 102 mEq/L (ref 96–112)
Glucose, Bld: 102 mg/dL — ABNORMAL HIGH (ref 70–99)
HCT: 43 % (ref 39.0–52.0)
Hemoglobin: 14.6 g/dL (ref 13.0–17.0)
Potassium: 3.8 mEq/L (ref 3.7–5.3)
SODIUM: 143 meq/L (ref 137–147)
TCO2: 26 mmol/L (ref 0–100)

## 2014-02-24 LAB — RAPID URINE DRUG SCREEN, HOSP PERFORMED
AMPHETAMINES: NOT DETECTED
Barbiturates: NOT DETECTED
Benzodiazepines: NOT DETECTED
Cocaine: NOT DETECTED
Opiates: NOT DETECTED
TETRAHYDROCANNABINOL: NOT DETECTED

## 2014-02-24 LAB — URINALYSIS, ROUTINE W REFLEX MICROSCOPIC
BILIRUBIN URINE: NEGATIVE
Glucose, UA: NEGATIVE mg/dL
Hgb urine dipstick: NEGATIVE
Ketones, ur: NEGATIVE mg/dL
Leukocytes, UA: NEGATIVE
Nitrite: NEGATIVE
PROTEIN: NEGATIVE mg/dL
SPECIFIC GRAVITY, URINE: 1.014 (ref 1.005–1.030)
UROBILINOGEN UA: 0.2 mg/dL (ref 0.0–1.0)
pH: 5 (ref 5.0–8.0)

## 2014-02-24 LAB — COMPREHENSIVE METABOLIC PANEL
ALT: 9 U/L (ref 0–53)
AST: 23 U/L (ref 0–37)
Albumin: 3.7 g/dL (ref 3.5–5.2)
Alkaline Phosphatase: 73 U/L (ref 39–117)
Anion gap: 14 (ref 5–15)
BILIRUBIN TOTAL: 0.7 mg/dL (ref 0.3–1.2)
BUN: 27 mg/dL — ABNORMAL HIGH (ref 6–23)
CHLORIDE: 102 meq/L (ref 96–112)
CO2: 27 meq/L (ref 19–32)
Calcium: 9 mg/dL (ref 8.4–10.5)
Creatinine, Ser: 1.01 mg/dL (ref 0.50–1.35)
GFR calc Af Amer: 78 mL/min — ABNORMAL LOW (ref >90)
GFR, EST NON AFRICAN AMERICAN: 67 mL/min — AB (ref >90)
Glucose, Bld: 99 mg/dL (ref 70–99)
POTASSIUM: 3.9 meq/L (ref 3.7–5.3)
Sodium: 143 mEq/L (ref 137–147)
Total Protein: 7 g/dL (ref 6.0–8.3)

## 2014-02-24 LAB — DIFFERENTIAL
BASOS ABS: 0.1 10*3/uL (ref 0.0–0.1)
Basophils Relative: 1 % (ref 0–1)
Eosinophils Absolute: 0.3 10*3/uL (ref 0.0–0.7)
Eosinophils Relative: 6 % — ABNORMAL HIGH (ref 0–5)
LYMPHS PCT: 31 % (ref 12–46)
Lymphs Abs: 1.6 10*3/uL (ref 0.7–4.0)
Monocytes Absolute: 0.6 10*3/uL (ref 0.1–1.0)
Monocytes Relative: 11 % (ref 3–12)
NEUTROS ABS: 2.7 10*3/uL (ref 1.7–7.7)
NEUTROS PCT: 51 % (ref 43–77)

## 2014-02-24 LAB — PROTIME-INR
INR: 2.02 — ABNORMAL HIGH (ref 0.00–1.49)
Prothrombin Time: 22.9 seconds — ABNORMAL HIGH (ref 11.6–15.2)

## 2014-02-24 LAB — CBC
HCT: 40 % (ref 39.0–52.0)
Hemoglobin: 13.3 g/dL (ref 13.0–17.0)
MCH: 34.5 pg — ABNORMAL HIGH (ref 26.0–34.0)
MCHC: 33.3 g/dL (ref 30.0–36.0)
MCV: 103.9 fL — ABNORMAL HIGH (ref 78.0–100.0)
PLATELETS: 139 10*3/uL — AB (ref 150–400)
RBC: 3.85 MIL/uL — ABNORMAL LOW (ref 4.22–5.81)
RDW: 15.4 % (ref 11.5–15.5)
WBC: 5.3 10*3/uL (ref 4.0–10.5)

## 2014-02-24 LAB — ETHANOL: Alcohol, Ethyl (B): <11 mg/dL (ref 0–11)

## 2014-02-24 LAB — APTT: APTT: 41 s — AB (ref 24–37)

## 2014-02-24 LAB — I-STAT TROPONIN, ED: Troponin i, poc: 0.02 ng/mL (ref 0.00–0.08)

## 2014-02-24 NOTE — ED Notes (Signed)
Pt did not sign electronically for discharge instructions.  Did verbalize understanding of all along with his son who was at bedside.

## 2014-02-24 NOTE — Discharge Instructions (Signed)
You probably have worsening of her Parkinson's syndrome because of progression of disease. This is probably the cause for your numbness in her leg you do not have a stroke and they do not eat any further workup at this stage but he did need to be seen by movement specialist/neurologist I have given him the name of someone who should be able to help you with this

## 2014-02-24 NOTE — ED Notes (Signed)
Unable to perform 1800 Neuro check, pt remains in MRI. Will perform upon pt's return.

## 2014-02-24 NOTE — ED Notes (Signed)
[facility] with [facility]/o left leg tingling/numbness. Pt stated that it started yesterday and lasted about 4 hours and he called his MD about it and they said if it was to happen again go to the ED. Pt tingling today started at 1230p and currently has decreased some but still there. Yesterday pt did c/o headache in the right temporal lobe but at this time denies any pain.

## 2014-02-24 NOTE — ED Provider Notes (Signed)
CSN: [id number]     Arrival date & time 02/24/14  1509 History   First MD Initiated Contact with Patient 02/24/14 1523     Chief Complaint  Patient presents with  . Numbness     (Consider location/radiation/quality/duration/timing/severity/associated sxs/prior Treatment) HPI Comments: Patient history of diabetes, severe aortic stenosis, hypertension hyperlipidemia, Parkinson's, and atrial fibrillation on Coumadin, presents with left extremity numbness. He states yesterday about 3:30 in the afternoon, he noticed some numbness in his left leg. This lasted about 3 hours and then went away. He denies any weakness in the leg. He woke up this morning and had the feeling of numbness in his right temple or area with some associated dizziness. He denies any specific spinning sensation that feels like he if he made any sudden movements she might pass out. He felt off balance at that point. That lasted about 2 hours and then went away. Then about 12:30 this afternoon, he noticed some numbness in his left leg that started again. Started in his feet and when all the way up to his left side. He also had numbness in his left hand and forearm. He denies any weakness in the extremities. He denies any facial drooping or slurred speech. He currently denies any headache or dizziness. He denies any vision changes. He denies any ataxia other than the episode that he had this morning. He states he had a similar episode a couple of years ago and was admitted to [facility]. He was not given a diagnosis, however they felt given his atrial fibrillation and severe aortic stenosis, he is at high risk for having TIAs from clots.   Past Medical History  Diagnosis Date  . Pulmonary embolus     bilateral  . Hypothyroid   . Hypertension   . Chronic atrial fibrillation     INR therpeutic on Coumadin  . Dementia     mild, with hallucinations status post psychiatric evaluation during admission  . Kidney cyst, acquired   .  A-fib   . CAD (coronary artery disease)     non-obstructive by cath 2006, no stents, ejection fraction  . Leukocytosis   . Tremor   . Parkinson disease   . Severe aortic stenosis   . HYPERCHOLESTEROLEMIA  IIA 12/20/2008    Qualifier: Diagnosis of  By: [name], MD, Johny Sax   . Peripheral neuropathy   . Weakness of both legs 03/18/2012  . Macrocytic anemia 03/19/2012  . Urinary incontinence 03/28/2012  . Parkinson's disease dementia 01/20/2013  . Limited mobility   . Morbid obesity with BMI of 40.0-44.9, adult   . Chronic diastolic congestive heart failure    Past Surgical History  Procedure Laterality Date  . Mole removal     Family History  Problem Relation Age of Onset  . Heart failure Mother   . Microcephaly Father    History  Substance Use Topics  . Smoking status: Former Smoker -- 2.00 packs/day for 12 years    Types: Cigarettes  . Smokeless tobacco: Never Used     Comment: Quit 40 years ago by report.  . Alcohol Use: No    Review of Systems  Constitutional: Negative for fever, chills, diaphoresis and fatigue.  HENT: Negative for congestion, rhinorrhea and sneezing.   Eyes: Negative.   Respiratory: Negative for cough, chest tightness and shortness of breath.   Cardiovascular: Negative for chest pain and leg swelling.  Gastrointestinal: Positive for blood in stool (chronic per pt, has had prior evaluation for  this). Negative for nausea, vomiting, abdominal pain and diarrhea.  Genitourinary: Negative for frequency, hematuria, flank pain and difficulty urinating.  Musculoskeletal: Negative for arthralgias and back pain.  Skin: Negative for rash.  Neurological: Positive for dizziness and numbness. Negative for facial asymmetry, speech difficulty, weakness and headaches.      Allergies  Morphine and related and Nifedipine  Home Medications   Prior to Admission medications   Medication Sig Start Date End Date Taking? Authorizing Provider  acetaminophen  (ARTHRITIS PAIN RELIEF) 650 MG CR tablet Take 650 mg by mouth. Take 2 tabs AM and 1 tab PM    Yes Historical Provider, MD  allopurinol (ZYLOPRIM) 300 MG tablet Take 300 mg by mouth daily.     Yes Historical Provider, MD  aspirin 81 MG chewable tablet Chew 81 mg by mouth daily.   Yes Historical Provider, MD  carbidopa-levodopa (SINEMET CR) 50-200 MG per tablet Take 1 tablet by mouth 3 (three) times daily. 03/29/12 02/24/14 Yes Leodis Sias, MD  Cyanocobalamin (VITAMIN B-12 IJ) Inject as directed every 30 (thirty) days.   Yes Historical Provider, MD  fluticasone (FLONASE) 50 MCG/ACT nasal spray Place 1 spray into both nostrils daily as needed for allergies.  01/25/14  Yes Historical Provider, MD  furosemide (LASIX) 80 MG tablet Take 1 tablet (80 mg total) by mouth daily. 04/12/12  Yes Herby Abraham, MD  levothyroxine (SYNTHROID) 175 MCG tablet Take 175 mcg by mouth daily.     Yes Historical Provider, MD  LORazepam (ATIVAN) 1 MG tablet Take 1 tablet (1 mg total) by mouth 2 (two) times daily as needed for anxiety. 03/29/12  Yes Leodis Sias, MD  metoprolol (TOPROL XL) 50 MG 24 hr tablet Take 50 mg by mouth daily.     Yes Historical Provider, MD  Misc Natural Products (OSTEO BI-FLEX ADV DOUBLE ST PO) Take 2 tablets by mouth daily.    Yes Historical Provider, MD  Omega-3 Fatty Acids (FISH OIL) 1000 MG CAPS Take 1 capsule by mouth 2 (two) times daily.    Yes Historical Provider, MD  pantoprazole (PROTONIX) 40 MG tablet Take 40 mg by mouth daily.  01/07/13  Yes Historical Provider, MD  polyethylene glycol (MIRALAX / GLYCOLAX) packet Take 17 g by mouth every evening.   Yes Historical Provider, MD  potassium chloride SA (K-DUR,KLOR-CON) 20 MEQ tablet Take 20 mEq by mouth daily.   Yes Historical Provider, MD  pravastatin (PRAVACHOL) 20 MG tablet Take 20 mg by mouth every morning.  09/22/13  Yes Historical Provider, MD  senna (SENOKOT) 8.6 MG tablet Take 3 tablets by mouth at bedtime.   Yes Historical  Provider, MD  SitaGLIPtin Phosphate (JANUVIA PO) Take 0.5 tablets by mouth every evening.   Yes Historical Provider, MD  sucralfate (CARAFATE) 1 G tablet Take 1 g by mouth 2 (two) times daily.     Yes Historical Provider, MD  tamsulosin (FLOMAX) 0.4 MG CAPS capsule Take 0.4 mg by mouth daily after supper.  09/22/13  Yes Historical Provider, MD  warfarin (COUMADIN) 2.5 MG tablet Take 2.5-5 mg by mouth daily at 6 PM. Takes 2.5mg  (1 tablet) daily except 5mg  (2 tablets) on Monday and Friday 03/29/12  Yes Leodis Sias, MD   BP 143/90  Pulse 82  Temp(Src) 98 F (36.7 C) (Oral)  Resp 19  SpO2 98% Physical Exam  Constitutional: He is oriented to person, place, and time. He appears well-developed and well-nourished.  HENT:  Head: Normocephalic and atraumatic.  Eyes: Pupils are  equal, round, and reactive to light.  Neck: Normal range of motion. Neck supple.  Cardiovascular: Normal rate, regular rhythm and normal heart sounds.   Pulmonary/Chest: Effort normal and breath sounds normal. No respiratory distress. He has no wheezes. He has no rales. He exhibits no tenderness.  Abdominal: Soft. Bowel sounds are normal. There is no tenderness. There is no rebound and no guarding.  Musculoskeletal: Normal range of motion. He exhibits no edema.  Lymphadenopathy:    He has no cervical adenopathy.  Neurological: He is alert and oriented to person, place, and time.  Motor 5/5 upper extremities, 4/5 bilateral lower extremities.  CN II-IX grossly intact.  No pronator drift.  FTN intact.  Slight tremor right arm  Skin: Skin is warm and dry. No rash noted.  Psychiatric: He has a normal mood and affect.    ED Course  Procedures (including critical care time) Labs Review Results for orders placed during the hospital encounter of 02/24/14  ETHANOL      Result Value Ref Range   Alcohol, Ethyl (B) <11  0 - 11 mg/dL  PROTIME-INR      Result Value Ref Range   Prothrombin Time 22.9 (*) 11.6 - 15.2 seconds    INR 2.02 (*) 0.00 - 1.49  APTT      Result Value Ref Range   aPTT 41 (*) 24 - 37 seconds  CBC      Result Value Ref Range   WBC 5.3  4.0 - 10.5 K/uL   RBC 3.85 (*) 4.22 - 5.81 MIL/uL   Hemoglobin 13.3  13.0 - 17.0 g/dL   HCT 60.4  54.0 - 98.1 %   MCV 103.9 (*) 78.0 - 100.0 fL   MCH 34.5 (*) 26.0 - 34.0 pg   MCHC 33.3  30.0 - 36.0 g/dL   RDW 19.1  47.8 - 29.5 %   Platelets 139 (*) 150 - 400 K/uL  DIFFERENTIAL      Result Value Ref Range   Neutrophils Relative % 51  43 - 77 %   Neutro Abs 2.7  1.7 - 7.7 K/uL   Lymphocytes Relative 31  12 - 46 %   Lymphs Abs 1.6  0.7 - 4.0 K/uL   Monocytes Relative 11  3 - 12 %   Monocytes Absolute 0.6  0.1 - 1.0 K/uL   Eosinophils Relative 6 (*) 0 - 5 %   Eosinophils Absolute 0.3  0.0 - 0.7 K/uL   Basophils Relative 1  0 - 1 %   Basophils Absolute 0.1  0.0 - 0.1 K/uL  COMPREHENSIVE METABOLIC PANEL      Result Value Ref Range   Sodium 143  137 - 147 mEq/L   Potassium 3.9  3.7 - 5.3 mEq/L   Chloride 102  96 - 112 mEq/L   CO2 27  19 - 32 mEq/L   Glucose, Bld 99  70 - 99 mg/dL   BUN 27 (*) 6 - 23 mg/dL   Creatinine, Ser 6.21  0.50 - 1.35 mg/dL   Calcium 9.0  8.4 - 30.8 mg/dL   Total Protein 7.0  6.0 - 8.3 g/dL   Albumin 3.7  3.5 - 5.2 g/dL   AST 23  0 - 37 U/L   ALT 9  0 - 53 U/L   Alkaline Phosphatase 73  39 - 117 U/L   Total Bilirubin 0.7  0.3 - 1.2 mg/dL   GFR calc non Af Amer 67 (*) >90 mL/min   GFR  calc Af Amer 78 (*) >90 mL/min   Anion gap 14  5 - 15  URINE RAPID DRUG SCREEN (HOSP PERFORMED)      Result Value Ref Range   Opiates NONE DETECTED  NONE DETECTED   Cocaine NONE DETECTED  NONE DETECTED   Benzodiazepines NONE DETECTED  NONE DETECTED   Amphetamines NONE DETECTED  NONE DETECTED   Tetrahydrocannabinol NONE DETECTED  NONE DETECTED   Barbiturates NONE DETECTED  NONE DETECTED  URINALYSIS, ROUTINE W REFLEX MICROSCOPIC      Result Value Ref Range   Color, Urine YELLOW  YELLOW   APPearance CLEAR  CLEAR   Specific Gravity,  Urine 1.014  1.005 - 1.030   pH 5.0  5.0 - 8.0   Glucose, UA NEGATIVE  NEGATIVE mg/dL   Hgb urine dipstick NEGATIVE  NEGATIVE   Bilirubin Urine NEGATIVE  NEGATIVE   Ketones, ur NEGATIVE  NEGATIVE mg/dL   Protein, ur NEGATIVE  NEGATIVE mg/dL   Urobilinogen, UA 0.2  0.0 - 1.0 mg/dL   Nitrite NEGATIVE  NEGATIVE   Leukocytes, UA NEGATIVE  NEGATIVE  I-STAT CHEM 8, ED      Result Value Ref Range   Sodium 143  137 - 147 mEq/L   Potassium 3.8  3.7 - 5.3 mEq/L   Chloride 102  96 - 112 mEq/L   BUN 27 (*) 6 - 23 mg/dL   Creatinine, Ser 1.61  0.50 - 1.35 mg/dL   Glucose, Bld 096 (*) 70 - 99 mg/dL   Calcium, Ion 0.45  4.09 - 1.30 mmol/L   TCO2 26  0 - 100 mmol/L   Hemoglobin 14.6  13.0 - 17.0 g/dL   HCT 81.1  91.4 - 78.2 %  I-STAT TROPOININ, ED      Result Value Ref Range   Troponin i, poc 0.02  0.00 - 0.08 ng/mL   Comment 3            Ct Head Wo Contrast  02/24/2014   CLINICAL DATA:  80 year old male with left leg numbness and tingling. Right-sided headache.  EXAM: CT HEAD WITHOUT CONTRAST  TECHNIQUE: Contiguous axial images were obtained from the base of the skull through the vertex without intravenous contrast.  COMPARISON:  03/18/2012 and prior head CTs dating back to 08/03/2003  FINDINGS: Atrophy, chronic small-vessel white matter ischemic changes and remote infarcts within the posterior right parietal and left occipital regions noted.  No acute intracranial abnormalities are identified, including mass lesion or mass effect, hydrocephalus, extra-axial fluid collection, midline shift, hemorrhage, or acute infarction.  The visualized bony calvarium is unremarkable.  IMPRESSION: No evidence of acute intracranial abnormality.  Atrophy, chronic small-vessel white matter ischemic changes and remote infarcts as described.   Electronically Signed   By: Laveda Abbe M.D.   On: 02/24/2014 16:32      Imaging Review Ct Head Wo Contrast  02/24/2014   CLINICAL DATA:  80 year old male with left leg numbness  and tingling. Right-sided headache.  EXAM: CT HEAD WITHOUT CONTRAST  TECHNIQUE: Contiguous axial images were obtained from the base of the skull through the vertex without intravenous contrast.  COMPARISON:  03/18/2012 and prior head CTs dating back to 08/03/2003  FINDINGS: Atrophy, chronic small-vessel white matter ischemic changes and remote infarcts within the posterior right parietal and left occipital regions noted.  No acute intracranial abnormalities are identified, including mass lesion or mass effect, hydrocephalus, extra-axial fluid collection, midline shift, hemorrhage, or acute infarction.  The visualized bony calvarium is unremarkable.  IMPRESSION: No evidence of acute intracranial abnormality.  Atrophy, chronic small-vessel white matter ischemic changes and remote infarcts as described.   Electronically Signed   By: Laveda AbbeJeff  Hu M.D.   On: 02/24/2014 16:32   Mr Brain Wo Contrast  02/24/2014   CLINICAL DATA:  Left leg tingling and numbness.  EXAM: MRI HEAD WITHOUT CONTRAST  TECHNIQUE: Multiplanar, multiecho pulse sequences of the brain and surrounding structures were obtained without intravenous contrast.  COMPARISON:  Head CT 02/24/2014 and MRI 08/22/2011  FINDINGS: There is no acute infarct, mass, midline shift, or extra-axial fluid collection. A punctate focus of susceptibility artifact in the left frontal lobe near the vertex may reflect a remote microhemorrhage. Lacunar infarcts are again noted in the basal ganglia and deep cerebral white matter. Patchy and confluent T2 hyperintensities in the cerebral white matter are compatible with advanced chronic small vessel ischemic disease. Small, remote cortical infarcts are again seen in the right parietal and left occipital lobes. There is moderate cerebral atrophy.  Abnormal appearance of the distal right vertebral artery flow void is unchanged. Other major intracranial vascular flow voids are unchanged. Prior right cataract extraction is noted. A small  right maxillary sinus mucous retention cyst is noted. Mastoid air cells are clear.  IMPRESSION: 1. No evidence of acute intracranial abnormality. 2. Advanced chronic small vessel ischemic disease, remote lacunar infarcts, and cerebral atrophy.  These results were called by telephone at the time of interpretation on 02/24/2014 at 7:05 pm to Dr. Mahala MenghiniSamtani, who verbally acknowledged these results.   Electronically Signed   By: Sebastian AcheAllen  Grady   On: 02/24/2014 19:11     EKG Interpretation   Date/Time:  Saturday February 24 2014 15:21:37 EDT Ventricular Rate:  70 PR Interval:    QRS Duration: 124 QT Interval:  416 QTC Calculation: 449 R Axis:   -64 Text Interpretation:  Atrial fibrillation Nonspecific IVCD with LAD  Probable anteroseptal infarct, old Abnormal T, consider ischemia, lateral  leads new T wave inversion laterally Confirmed by Britton Bera  MD, Jaquanda Wickersham  ([id number]) on 02/24/2014 3:54:45 PM      MDM   Final diagnoses:  Numbness    Patient presents with transient numbness of his left arm and left leg. His MRI is negative for acute infarct. Dr. Mahala MenghiniSamtani with the hospitalist service has evaluated the patient and has consulted with Dr. Roseanne RenoStewart. They feel that this could be resulting from his worsening Parkinson's. He recommends followup with [facility] who is a  movement disorder specialist. Patient is comfortable with this decision and was discharged home with family.    Rolan BuccoMelanie Asaph Serena, MD 02/24/14 1946

## 2014-02-24 NOTE — H&P (Signed)
[facility] History and Physical  [name] Graham ZOX:[id number] DOB: 04/14/1929 DOA: 02/24/2014  Referring physician: ED PCP: [name], [name], MD  Specialists: Neurology  Chief Complaint: Ataxia and weak  HPI:  80 y/o ?, severe AoS complicated in past by "passing out" spells 01/22/12, [non-op candidate], Parkinson's complicated in past by Urinary incontinence/Altered mental status, Afib on coumadin [INR today therapeutic at 2.02], h/o pulm emboli 08/23/07 with IVC filter , chronic diastolic failure NYHA class 2, Hld, Htn, Hypothyroid, htn, ty 2 DM, Reported h/o CAD presented with subacute onset of left to weakness/numbness going up his left side. He states that this been going on for 2 days. It has not been associated with acute limb weakness it is described as just a numbness. He is had no dysphagia he is had no blurred vision double vision dysphagia dysphonia and when he started having it he was sitting in his chair. He has not had any falls or any other symptomatology. He states that when he was first diagnosed as Parkinson's about 2-3 years ago his initial symptoms were numbness lower extremity as well as urinary incontinence and inability to walk and his tremors and other issues have gotten much better since then but currently is on max dose of Sinemet. His neurologist Dr. love retired and he has not had followup in about a year.  He thought initially that this may be because of new medications at in which he started recently because of his sugar control being a little bit more erratic. I explained to them that this is not the case  He denies Any further findings as below  EKG: Independently reviewed. Atrial fibrillation axis 0, interventricular conduction delay noted abnormal EKG Labs in the emergency room showed a little bit of mild renal insufficiency BUN 27/31.1 glucose 102 Hemoglobin was 14.6  I had a long discussion with him and his family about his findings and went over in detail  his imaging up-to-date. I also called radiologist Dr. Mosetta PuttGrady who stated that there was no acute CVA noted. I then discussed case with Dr. Roseanne RenoStewart of neurology who stated that patient would not be a candidate for selegiline or any other adjunctive back and since meds at present. He recommends close for the movement disorder specialist/neurologist Dr. Arbutus Leasat and I will put that followup and his discharge note.  He will also need to increase his fluids slightly  Thank you for this consult patient is safe to discharge home and should follow up closely with Dr. Lurena Joinerebecca Tat As discussed earlier  I updated the emergency room physician about my findings and consult    Review of Systems:  Denies chest pain Nausea  Abdominal pain, Cough Cold Blurred vision Double vision Dysphagia Dysphonia Dysuria He does occasionally complain of dark stools but this is irregular. Hemoglobin here is 14    Past Medical History  Diagnosis Date  . Pulmonary embolus     bilateral  . Hypothyroid   . Hypertension   . Chronic atrial fibrillation     INR therpeutic on Coumadin  . Dementia     mild, with hallucinations status post psychiatric evaluation during admission  . Kidney cyst, acquired   . A-fib   . CAD (coronary artery disease)     non-obstructive by cath 2006, no stents, ejection fraction  . Leukocytosis   . Tremor   . Parkinson disease   . Severe aortic stenosis   . HYPERCHOLESTEROLEMIA  IIA 12/20/2008    Qualifier: Diagnosis of  By:  [name], MD, [facility], [name]   . Peripheral neuropathy   . Weakness of both legs 03/18/2012  . Macrocytic anemia 03/19/2012  . Urinary incontinence 03/28/2012  . Parkinson's disease dementia 01/20/2013  . Limited mobility   . Morbid obesity with BMI of 40.0-44.9, adult   . Chronic diastolic congestive heart failure    Past Surgical History  Procedure Laterality Date  . Mole removal     Social History:  History   Social History Narrative   PCP is Renae Fickle, Cardiologist is Dr. Excell Seltzer. He receives Medicare.    Power of [name] is his son: [name] - [phone].    Caretaker is [name]: [phone].      Originally from [place], completed high school. 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    He then lived with wife in [place] after her heart valve replacement.    Wife passed away on September 05, 2013. He now lives in [place], [place] again.   He had a home health nurse caring for him. His daughter had been living with he and his wife since his wife had a valve replacement in 6/13.      Has not smoked in 40 years.     Allergies  Allergen Reactions  . Morphine And Related Other (See Comments)    Goes Crazy  . Nifedipine Other (See Comments)    "makes me go crazy" procardia    Family History  Problem Relation Age of Onset  . Heart failure Mother   . Microcephaly Father     Prior to Admission medications   Medication Sig Start Date End Date Taking? Authorizing Provider  acetaminophen (ARTHRITIS PAIN RELIEF) 650 MG CR tablet Take 650 mg by mouth. Take 2 tabs AM and 1 tab PM    Yes Historical Provider, MD  allopurinol (ZYLOPRIM) 300 MG tablet Take 300 mg by mouth daily.     Yes Historical Provider, MD  aspirin 81 MG chewable tablet Chew 81 mg by mouth daily.   Yes Historical Provider, MD  carbidopa-levodopa (SINEMET CR) 50-200 MG per tablet Take 1 tablet by mouth 3 (three) times daily. 03/29/12 02/24/14 Yes Leodis Sias, MD  Cyanocobalamin (VITAMIN B-12 IJ) Inject as directed every 30 (thirty) days.   Yes Historical Provider, MD  fluticasone (FLONASE) 50 MCG/ACT nasal spray Place 1 spray into both nostrils daily as needed for allergies.  01/25/14  Yes Historical Provider, MD  furosemide (LASIX) 80 MG tablet Take 1 tablet (80 mg total) by mouth daily. 04/12/12  Yes Herby Abraham, MD  levothyroxine (SYNTHROID) 175 MCG tablet Take 175 mcg by mouth daily.     Yes Historical  Provider, MD  LORazepam (ATIVAN) 1 MG tablet Take 1 tablet (1 mg total) by mouth 2 (two) times daily as needed for anxiety. 03/29/12  Yes Leodis Sias, MD  metoprolol (TOPROL XL) 50 MG 24 hr tablet Take 50 mg by mouth daily.     Yes Historical Provider, MD  Misc Natural Products (OSTEO BI-FLEX ADV DOUBLE ST PO) Take 2 tablets by mouth daily.    Yes Historical Provider, MD  Omega-3 Fatty Acids (FISH OIL) 1000 MG CAPS Take 1 capsule by mouth 2 (two) times daily.    Yes Historical Provider, MD  pantoprazole (PROTONIX) 40 MG tablet Take 40 mg by mouth daily.  01/07/13  Yes Historical Provider, MD  polyethylene glycol (MIRALAX / GLYCOLAX) packet Take 17 g by mouth every evening.  Yes Historical Provider, MD  potassium chloride SA (K-DUR,KLOR-CON) 20 MEQ tablet Take 20 mEq by mouth daily.   Yes Historical Provider, MD  pravastatin (PRAVACHOL) 20 MG tablet Take 20 mg by mouth every morning.  09/22/13  Yes Historical Provider, MD  senna (SENOKOT) 8.6 MG tablet Take 3 tablets by mouth at bedtime.   Yes Historical Provider, MD  SitaGLIPtin Phosphate (JANUVIA PO) Take 0.5 tablets by mouth every evening.   Yes Historical Provider, MD  sucralfate (CARAFATE) 1 G tablet Take 1 g by mouth 2 (two) times daily.     Yes Historical Provider, MD  tamsulosin (FLOMAX) 0.4 MG CAPS capsule Take 0.4 mg by mouth daily after supper.  09/22/13  Yes Historical Provider, MD  warfarin (COUMADIN) 2.5 MG tablet Take 2.5-5 mg by mouth daily at 6 PM. Takes 2.5mg  (1 tablet) daily except 5mg  (2 tablets) on Monday and Friday 03/29/12  Yes Leodis Sias, MD   Physical Exam: Filed Vitals:   02/24/14 1522 02/24/14 1606 02/24/14 1621  BP: 132/65 133/77   Pulse: 70 81   Temp: 97.9 F (36.6 C)  98 F (36.7 C)  TempSrc: Oral    Resp: 16 15   SpO2: 96% 96%      General:  Alert very pleasant oriented no apparent distress , notable intention tremor   Eyes: EOMI NCAT   ENT: Soft supple moderate intention   Neck: See above    Cardiovascular: S1-S2 no murmur rub or gallop , irregularly irregular   Respiratory: Clinically clear   Abdomen: No rebound or guarding   Skin: No extensive varicose veins with evidence of prior venostasis dermatitis   Musculoskeletal: Range of motion intact   Psychiatric: Euthymic   Neurologic: Grossly intact moves all 4 limbs equally 5/5 power but intention tremor. Extraocular movements intact, uvula midline, smile symmetric, sensory is inconsistent, reflexes 2/3 finger-nose-finger within normal limits, gait not assessed  Labs on Admission:  Basic Metabolic Panel:  Recent Labs Lab 02/24/14 1544 02/24/14 1611  NA 143 143  K 3.9 3.8  CL 102 102  CO2 27  --   GLUCOSE 99 102*  BUN 27* 27*  CREATININE 1.01 1.10  CALCIUM 9.0  --    Liver Function Tests:  Recent Labs Lab 02/24/14 1544  AST 23  ALT 9  ALKPHOS 73  BILITOT 0.7  PROT 7.0  ALBUMIN 3.7   No results found for this basename: LIPASE, AMYLASE,  in the last 168 hours No results found for this basename: AMMONIA,  in the last 168 hours CBC:  Recent Labs Lab 02/24/14 1544 02/24/14 1611  WBC 5.3  --   NEUTROABS 2.7  --   HGB 13.3 14.6  HCT 40.0 43.0  MCV 103.9*  --   PLT 139*  --    Cardiac Enzymes: No results found for this basename: CKTOTAL, CKMB, CKMBINDEX, TROPONINI,  in the last 168 hours  BNP (last 3 results)  Recent Labs  09/08/13 1243  PROBNP 156.0*   CBG: No results found for this basename: GLUCAP,  in the last 168 hours  Radiological Exams on Admission: Ct Head Wo Contrast  02/24/2014   CLINICAL DATA:  80 year old male with left leg numbness and tingling. Right-sided headache.  EXAM: CT HEAD WITHOUT CONTRAST  TECHNIQUE: Contiguous axial images were obtained from the base of the skull through the vertex without intravenous contrast.  COMPARISON:  03/18/2012 and prior head CTs dating back to 08/03/2003  FINDINGS: Atrophy, chronic small-vessel white matter ischemic changes  and remote  infarcts within the posterior right parietal and left occipital regions noted.  No acute intracranial abnormalities are identified, including mass lesion or mass effect, hydrocephalus, extra-axial fluid collection, midline shift, hemorrhage, or acute infarction.  The visualized bony calvarium is unremarkable.  IMPRESSION: No evidence of acute intracranial abnormality.  Atrophy, chronic small-vessel white matter ischemic changes and remote infarcts as described.   Electronically Signed   By: Laveda Abbe M.D.   On: 02/24/2014 16:32        Time spent: 37  Mahala Menghini [facility] Pager [phone]  If 7PM-7AM, please contact night-coverage [email or web address] Password [facility] 02/24/2014, 6:27 PM

## 2014-02-26 ENCOUNTER — Telehealth: Payer: Self-pay | Admitting: Cardiovascular Disease

## 2014-02-26 NOTE — Telephone Encounter (Signed)
New message          C/o numbness from waist down / pt is seeing cooper tomorrow but would like a call from a nurse today

## 2014-02-26 NOTE — Telephone Encounter (Signed)
I spoke with [name] Graham and made him aware that the pt needs to follow-up with Neurology. At this time [name] Graham did not want to cancel the appt with Dr Excell Seltzerooper.  I made [name] Graham aware that I would contact Dr Don Perkingat's office and check the status of appointment.  Per Dr Don Perkingat's office they are in the process of arranging appointment.  I left a message on [name] Graham's voicemail to let him know that Neurology will be contacting him for an appointment.

## 2014-02-26 NOTE — Telephone Encounter (Signed)
New message            C/o numbness from waist down / pt would like a call from the nurse / pt coming in to see cooper tomorrow

## 2014-02-27 ENCOUNTER — Ambulatory Visit (INDEPENDENT_AMBULATORY_CARE_PROVIDER_SITE_OTHER): Payer: Medicare Other | Admitting: Cardiovascular Disease

## 2014-02-27 ENCOUNTER — Encounter: Payer: Self-pay | Admitting: Cardiovascular Disease

## 2014-02-27 VITALS — BP 128/76 | HR 82 | Ht 72.0 in | Wt 287.0 lb

## 2014-02-27 DIAGNOSIS — R209 Unspecified disturbances of skin sensation: Secondary | ICD-10-CM

## 2014-02-27 DIAGNOSIS — R200 Anesthesia of skin: Secondary | ICD-10-CM

## 2014-02-27 NOTE — Progress Notes (Signed)
HPI:   This is an 80 year old gentleman who is followed for severe aortic stenosis and multiple medical problems. I just saw him July 24 for routine followup. He was doing well from a cardiac perspective at that time. However, later that evening he developed numbness in his left leg. There is no associated weakness or clumsiness. He was evaluated in the emergency department and he underwent a CT and MRI of the brain. Both were unrevealing for any acute abnormalities. There are chronic changes seen. The patient has not had any facial drooping or slurred speech. He's had no vision loss or symptoms of amaurosis fugax. He denies weakness or clumsiness. He presents today for further evaluation as he was concerned that some of his symptoms may be heart related. He specifically denies any change in chronic dyspnea. He's had no chest pain, chest pressure, or pre-. He has had some dizziness.  Outpatient Encounter Prescriptions as of 02/27/2014  Medication Sig  . acetaminophen (ARTHRITIS PAIN RELIEF) 650 MG CR tablet Take 650 mg by mouth. Take 2 tabs AM and 1 tab PM   . allopurinol (ZYLOPRIM) 300 MG tablet Take 300 mg by mouth daily.    Marland Kitchen aspirin 81 MG chewable tablet Chew 81 mg by mouth daily.  . carbidopa-levodopa (SINEMET CR) 50-200 MG per tablet Take 1 tablet by mouth 3 (three) times daily.  . Cyanocobalamin (VITAMIN B-12 IJ) Inject as directed every 30 (thirty) days.  . fluticasone (FLONASE) 50 MCG/ACT nasal spray Place 1 spray into both nostrils daily as needed for allergies.   . furosemide (LASIX) 80 MG tablet Take 1 tablet (80 mg total) by mouth daily.  Marland Kitchen levothyroxine (SYNTHROID) 175 MCG tablet Take 175 mcg by mouth daily.    Marland Kitchen LORazepam (ATIVAN) 1 MG tablet Take 1 tablet (1 mg total) by mouth 2 (two) times daily as needed for anxiety.  . metoprolol (TOPROL XL) 50 MG 24 hr tablet Take 50 mg by mouth daily.    . Misc Natural Products (OSTEO BI-FLEX ADV DOUBLE ST PO) Take 2 tablets by mouth daily.     . Omega-3 Fatty Acids (FISH OIL) 1000 MG CAPS Take 1 capsule by mouth 2 (two) times daily.   . pantoprazole (PROTONIX) 40 MG tablet Take 40 mg by mouth daily.   . polyethylene glycol (MIRALAX / GLYCOLAX) packet Take 17 g by mouth every evening.  . potassium chloride SA (K-DUR,KLOR-CON) 20 MEQ tablet Take 20 mEq by mouth daily.  . pravastatin (PRAVACHOL) 20 MG tablet Take 20 mg by mouth every morning.   . senna (SENOKOT) 8.6 MG tablet Take 3 tablets by mouth at bedtime.  . sucralfate (CARAFATE) 1 G tablet Take 1 g by mouth 2 (two) times daily.    . tamsulosin (FLOMAX) 0.4 MG CAPS capsule Take 0.4 mg by mouth daily after supper.   . warfarin (COUMADIN) 2.5 MG tablet Take 2.5-5 mg by mouth daily at 6 PM. Takes 2.5mg  (1 tablet) daily except 5mg  (2 tablets) on Monday and Friday  . [DISCONTINUED] SitaGLIPtin Phosphate (JANUVIA PO) Take 0.5 tablets by mouth every evening.    Allergies  Allergen Reactions  . Morphine And Related Other (See Comments)    Goes Crazy  . Nifedipine Other (See Comments)    "makes me go crazy" procardia    Past Medical History  Diagnosis Date  . Pulmonary embolus     bilateral  . Hypothyroid   . Hypertension   . Chronic atrial fibrillation  INR therpeutic on Coumadin  . Dementia     mild, with hallucinations status post psychiatric evaluation during admission  . Kidney cyst, acquired   . A-fib   . CAD (coronary artery disease)     non-obstructive by cath 2006, no stents, ejection fraction  . Leukocytosis   . Tremor   . Parkinson disease   . Severe aortic stenosis   . HYPERCHOLESTEROLEMIA  IIA 12/20/2008    Qualifier: Diagnosis of  By: Riley KillStuckey, MD, Johny SaxFACC, Thomas David   . Peripheral neuropathy   . Weakness of both legs 03/18/2012  . Macrocytic anemia 03/19/2012  . Urinary incontinence 03/28/2012  . Parkinson's disease dementia 01/20/2013  . Limited mobility   . Morbid obesity with BMI of 40.0-44.9, adult   . Chronic diastolic congestive heart failure      BP 128/76  Pulse 82  Ht 6' (1.829 m)  Wt 287 lb (130.182 kg)  BMI 38.92 kg/m2  PHYSICAL EXAM: Pt is alert and oriented, elderly, pleasant male in NAD HEENT: normal Neck: JVP - normal, carotids delayed Lungs: CTA bilaterally CV: Irregular with grade 3/6 harsh late peaking systolic murmur at the left lower sternal border Abd: soft, NT, Positive BS, no hepatomegaly Ext: no C/C/E, distal pulses intact and equal Skin: warm/dry no rash Neuro: Grip strength 5 over 5 bilateral, leg strength 5 over 5 bilateral  CT brain: FINDINGS:  Atrophy, chronic small-vessel white matter ischemic changes and  remote infarcts within the posterior right parietal and left  occipital regions noted.  No acute intracranial abnormalities are identified, including mass  lesion or mass effect, hydrocephalus, extra-axial fluid collection,  midline shift, hemorrhage, or acute infarction.  The visualized bony calvarium is unremarkable.  IMPRESSION:  No evidence of acute intracranial abnormality.  Atrophy, chronic small-vessel white matter ischemic changes and  remote infarcts as described.  MRI brain: FINDINGS:  There is no acute infarct, mass, midline shift, or extra-axial fluid  collection. A punctate focus of susceptibility artifact in the left  frontal lobe near the vertex may reflect a remote microhemorrhage.  Lacunar infarcts are again noted in the basal ganglia and deep  cerebral white matter. Patchy and confluent T2 hyperintensities in  the cerebral white matter are compatible with advanced chronic small  vessel ischemic disease. Small, remote cortical infarcts are again  seen in the right parietal and left occipital lobes. There is  moderate cerebral atrophy.  Abnormal appearance of the distal right vertebral artery flow void  is unchanged. Other major intracranial vascular flow voids are  unchanged. Prior right cataract extraction is noted. A small right  maxillary sinus mucous retention  cyst is noted. Mastoid air cells  are clear.  IMPRESSION:  1. No evidence of acute intracranial abnormality.  2. Advanced chronic small vessel ischemic disease, remote lacunar  infarcts, and cerebral atrophy.  These results were called by telephone at the time of interpretation  on 02/24/2014 at 7:05 pm to Dr. Mahala MenghiniSamtani, who verbally acknowledged  these results.   ASSESSMENT AND PLAN: 1. Leg numbness, question etiology 2. Severe aortic stenosis with stable symptoms 3. Permanent atrial fibrillation 4. Chronic diastolic heart failure, unchanged from recent evaluation  The patient appears stable from a cardiac perspective. I'm not sure of the cause for his neurologic symptoms. He has been referred to a Parkinson's disease specialist for followup evaluation. I do not think any cardiac-related problem has caused his leg numbness. There is no sign of stroke on neuro imaging with a CT scan and MRI  of the brain. The patient was sure today with respect to his cardiac status. He will followup as planned. Please see recent office note for discussion regarding his chronic cardiovascular problems.  Tonny Bollman 02/27/2014 10:58 AM

## 2014-02-27 NOTE — Telephone Encounter (Signed)
The pt came into the office to see Dr Excell Seltzerooper.

## 2014-02-27 NOTE — Patient Instructions (Signed)
Your physician recommends that you follow-up with Cardiology as planned.   Your physician recommends that you continue on your current medications as directed. Please refer to the Current Medication list given to you today.

## 2014-02-28 ENCOUNTER — Ambulatory Visit (INDEPENDENT_AMBULATORY_CARE_PROVIDER_SITE_OTHER): Payer: Medicare Other | Admitting: Neurology

## 2014-02-28 ENCOUNTER — Encounter: Payer: Self-pay | Admitting: Neurology

## 2014-02-28 VITALS — BP 130/76 | HR 66 | Temp 98.0°F | Resp 18 | Ht 72.0 in | Wt 267.8 lb

## 2014-02-28 DIAGNOSIS — G20 Parkinson's disease: Secondary | ICD-10-CM

## 2014-02-28 DIAGNOSIS — G629 Polyneuropathy, unspecified: Secondary | ICD-10-CM

## 2014-02-28 DIAGNOSIS — R200 Anesthesia of skin: Secondary | ICD-10-CM

## 2014-02-28 DIAGNOSIS — G20A1 Parkinson's disease without dyskinesia, without mention of fluctuations: Secondary | ICD-10-CM

## 2014-02-28 DIAGNOSIS — R209 Unspecified disturbances of skin sensation: Secondary | ICD-10-CM

## 2014-02-28 DIAGNOSIS — G609 Hereditary and idiopathic neuropathy, unspecified: Secondary | ICD-10-CM

## 2014-02-28 DIAGNOSIS — I679 Cerebrovascular disease, unspecified: Secondary | ICD-10-CM

## 2014-02-28 MED ORDER — GABAPENTIN 100 MG PO CAPS: 100.0000 mg | ORAL_CAPSULE | Freq: Three times a day (TID) | ORAL | Status: DC

## 2014-02-28 NOTE — Progress Notes (Signed)
NEUROLOGY CONSULTATION NOTE  [name] Graham MRN: [id number] DOB: 1928/09/12  Referring provider: Rolan Bucco, MD (ED) Primary care provider: Renae Fickle, MD  Reason for consult:  Transient left sided numbness, Parkinson's disease.  HISTORY OF PRESENT ILLNESS: [name] Graham is an 80 year old right-handed man with history of Parkinson's disease, type II diabetes mellitus, atrial fibrillation and DVT on warfarin and with IVC filter, severe aortic stenosis, chronic diastolic heart failure, hypertension and hyperlipidemia who presents for transient left lower numbness and Parkinson's disease.  Records and images reviewed.  For about 2 months, he began noticing numbness and tingling in his feet and a little bit in his hands.  Over the past week, he has had episodes of transient numbness and tingling.  It usually occurs when he is sitting and relaxing.  He suddenly feels a numb pins and needles sensation in his toes that spreads over the entire left foot and travels up the entire left leg up to just below the ribcage on his left side.  It is painful, particularly at the ribcage.  It lasts about 3 hours and resolves.  He says that if he gets up and walks, it resolves sooner, maybe 2 hours.  There is no associated weakness, shooting pain from the back down the leg, neck pain or bowel or bladder incontinence.  On 02/22/14, he presented to the ED, where CT of the head revealed no bleed or acute infarct and MRI of the brain without contrast revealed small vessel ischemic changes and remote lacunar infarcts in the basal ganglia, but no acute infarcts.  UA was negative and EKG was unrevealing.  The neurohospitalist thought that symptoms may be related to worsening Parkinson's.  He was evaluated by his cardiologist, Dr. Excell Seltzer, and everything appeared stable from a cardiac perspective.  These episodes have occurred about once a day.   He was diagnosed with Parkinson's disease about 4-5 years ago.  Symptoms  started with tremor in both hands.  Gradually he had significant gait instability and leg weakness.  He takes Sinemet CR 50-200mg  three times daily which is effective for him.  He is able to ambulate.  He still has a tremor but is able to use utensils.  He was previously followed by Dr. Marjory Lies at [facility].  Medications include:  Sinemet CR 50-200mg  three times daily, levothyroxine , warfarin, pravastatin 20mg , furosemide, lorazepam 1mg  twice daily as needed, metoprolol 50mg    PAST MEDICAL HISTORY: Past Medical History  Diagnosis Date  . Pulmonary embolus     bilateral  . Hypothyroid   . Hypertension   . Chronic atrial fibrillation     INR therpeutic on Coumadin  . Dementia     mild, with hallucinations status post psychiatric evaluation during admission  . Kidney cyst, acquired   . A-fib   . CAD (coronary artery disease)     non-obstructive by cath 2006, no stents, ejection fraction  . Leukocytosis   . Tremor   . Parkinson disease   . Severe aortic stenosis   . HYPERCHOLESTEROLEMIA  IIA 12/20/2008    Qualifier: Diagnosis of  By: [name], MD, Johny Sax   . Peripheral neuropathy   . Weakness of both legs 03/18/2012  . Macrocytic anemia 03/19/2012  . Urinary incontinence 03/28/2012  . Parkinson's disease dementia 01/20/2013  . Limited mobility   . Morbid obesity with BMI of 40.0-44.9, adult   . Chronic diastolic congestive heart failure     PAST SURGICAL HISTORY: Past Surgical History  Procedure Laterality Date  . Mole removal      MEDICATIONS: Current Outpatient Prescriptions on File Prior to Visit  Medication Sig Dispense Refill  . acetaminophen (ARTHRITIS PAIN RELIEF) 650 MG CR tablet Take 650 mg by mouth. Take 2 tabs AM and 1 tab PM       . allopurinol (ZYLOPRIM) 300 MG tablet Take 300 mg by mouth daily.        Marland Kitchen aspirin 81 MG chewable tablet Chew 81 mg by mouth daily.      . Cyanocobalamin (VITAMIN B-12 IJ) Inject as directed every 30 (thirty) days.      .  fluticasone (FLONASE) 50 MCG/ACT nasal spray Place 1 spray into both nostrils daily as needed for allergies.       . furosemide (LASIX) 80 MG tablet Take 1 tablet (80 mg total) by mouth daily.  30 tablet  1  . levothyroxine (SYNTHROID) 175 MCG tablet Take 175 mcg by mouth daily.        Marland Kitchen LORazepam (ATIVAN) 1 MG tablet Take 1 tablet (1 mg total) by mouth 2 (two) times daily as needed for anxiety.  30 tablet  0  . metoprolol (TOPROL XL) 50 MG 24 hr tablet Take 50 mg by mouth daily.        . Misc Natural Products (OSTEO BI-FLEX ADV DOUBLE ST PO) Take 2 tablets by mouth daily.       . Omega-3 Fatty Acids (FISH OIL) 1000 MG CAPS Take 1 capsule by mouth 2 (two) times daily.       . pantoprazole (PROTONIX) 40 MG tablet Take 40 mg by mouth daily.       . polyethylene glycol (MIRALAX / GLYCOLAX) packet Take 17 g by mouth every evening.      . potassium chloride SA (K-DUR,KLOR-CON) 20 MEQ tablet Take 20 mEq by mouth daily.      . pravastatin (PRAVACHOL) 20 MG tablet Take 20 mg by mouth every morning.       . senna (SENOKOT) 8.6 MG tablet Take 3 tablets by mouth at bedtime.      . sucralfate (CARAFATE) 1 G tablet Take 1 g by mouth 2 (two) times daily.        . tamsulosin (FLOMAX) 0.4 MG CAPS capsule Take 0.4 mg by mouth daily after supper.       . warfarin (COUMADIN) 2.5 MG tablet Take 2.5-5 mg by mouth daily at 6 PM. Takes 2.5mg  (1 tablet) daily except 5mg  (2 tablets) on Monday and Friday      . carbidopa-levodopa (SINEMET CR) 50-200 MG per tablet Take 1 tablet by mouth 3 (three) times daily.  90 tablet  0   No current facility-administered medications on file prior to visit.    ALLERGIES: Allergies  Allergen Reactions  . Morphine And Related Other (See Comments)    Goes Crazy  . Nifedipine Other (See Comments)    "makes me go crazy" procardia    FAMILY HISTORY: Family History  Problem Relation Age of Onset  . Heart failure Mother   . Microcephaly Father     SOCIAL HISTORY: History    Social History  . Marital Status: Married    Spouse Name: N/A    Number of Children: 6  . Years of Education: 12   Occupational History  . retired    Social History Main Topics  . Smoking status: Former Smoker -- 2.00 packs/day for 12 years    Types: Cigarettes  . Smokeless tobacco:  Never Used     Comment: Quit 40 years ago by report.  . Alcohol Use: No  . Drug Use: No  . Sexual Activity: No   Other Topics Concern  . Not on file   Social History Narrative   PCP is Renae Fickle, Cardiologist is Dr. Excell Seltzer. He receives Medicare.    Power of [name] is his son: Azar South - [phone].    Caretaker is [name]: [phone].      Originally from [place], completed high school. 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    He then lived with wife in [place] after her heart valve replacement.    Wife passed away on 09-10-13. He now lives in [place], [place] again.   He had a home health nurse caring for him. His daughter had been living with he and his wife since his wife had a valve replacement in 6/13.      Has not smoked in 40 years.     REVIEW OF SYSTEMS: Constitutional: No fevers, chills, or sweats, no generalized fatigue, change in appetite Eyes: No visual changes, double vision, eye pain Ear, nose and throat: No hearing loss, ear pain, nasal congestion, sore throat Cardiovascular: No chest pain, palpitations Respiratory:  No shortness of breath at rest or with exertion, wheezes GastrointestinaI: No nausea, vomiting, diarrhea, abdominal pain, fecal incontinence Genitourinary:  No dysuria, urinary retention or frequency Musculoskeletal:  No neck pain, back pain Integumentary: No rash, pruritus, skin lesions Neurological: as above Psychiatric: No depression, insomnia, anxiety Endocrine: No palpitations, fatigue, diaphoresis, mood swings, change in appetite, change in weight, increased thirst Hematologic/Lymphatic:   No anemia, purpura, petechiae. Allergic/Immunologic: no itchy/runny eyes, nasal congestion, recent allergic reactions, rashes  PHYSICAL EXAM: Filed Vitals:   02/28/14 1427  BP: 130/76  Pulse: 66  Temp: 98 F (36.7 C)  Resp: 18   General: No acute distress Head:  Normocephalic/atraumatic Neck: supple, no paraspinal tenderness, full range of motion Back: No paraspinal tenderness Heart: regular rate and rhythm Lungs: Clear to auscultation bilaterally. Vascular: No carotid bruits.  Significant peripheral vascular disease involving the lower legs. Neurological Exam: Mental status: alert and oriented to person, place, and time, recent and remote memory intact, fund of knowledge intact, attention and concentration intact, speech hypophonic but fluent and not dysarthric, language intact. Cranial nerves: CN I: not tested CN II: pupils equal, round and reactive to light, visual fields intact, fundi not visualized CN III, IV, VI:  Reduced upgaze, no nystagmus, no ptosis CN V: facial sensation intact CN VII: upper and lower face symmetric, hypomimia CN VIII: hearing intact CN IX, X: gag intact, uvula midline.  CN XI: sternocleidomastoid and trapezius muscles intact CN XII: tongue midline Bulk & Tone: normal, no fasciculations. Motor:5/5 throughout.  Mild cogwheel rigidity in the wrists.  Bilateral resting tremor, much more prominent on the right.  Reduced thumb-finger tapping speed and amplitude.  No tenderness to palpation of the legs. Sensation: mildly reduced pinprick sensation in the hands up to the distal forearm.  Significantly reduced pinprick sensation in the feet up to the ankle on the right and to the distal shin on the left.  Significantly reduced vibration sensation up to above the knees.  No sensory level noted in the thorax. Deep Tendon Reflexes: 2+ and symmetric in the lower extremities, 1+ and symmetric in the patellars, absent in the ankles.  Toes downgoing. Finger to nose  testing: no dysmetria Gait: normal  station and stride with normal arm swing but tremor noted in the right hand.  Turns around in 4 steps.  Retropulsion negative. Romberg negative.  IMPRESSION: 1.  Transient traveling left lower extremity paresthesias.  I have no explanation for this.  It is not a cerebrovascular event, based on its repetitive nature.  It does not fit the dermatomal pattern for a radiculopathy or neuropathy.  He exhibits no other abnormalities to suggest a cord lesion, which would also be unusual since the paresthesias travel and are transient.  It does not seem like a simple partial seizure. 2.  Parkinson's disease.  3.  Peripheral neuropathy.  This is apparently new.  Reviewing Dr. Richrd HumblesPenumalli's note from June 2014, his sensation was intact.  Mr. Randa LynnLamb says he only noticed the numbness over the past year. 4.  Cerebrovascular disease  PLAN: 1.  At this point, I would focus on what I do see, which is the peripheral neuropathy.  We will schedule NCV-EMG of the left upper and lower extremities.  We will check blood work for common causes of neuropathy, such as B12, TSH, ANA, Sed Rate, SPEP/IFE.  More tests possible pending EMG results. 2.  For episodic pain and paresthesias, will try gabapentin 100mg  three times daily. 3.  Continue Sinemet CR 50-200mg  three times daily 4.  Anticoagulation, statin, blood pressure control for secondary stroke prevention. 5.  Follow up after testing.  Thank you for allowing me to take part in the care of this patient.  Shon MilletAdam Jaffe, DO  CC:  Renae FickleJohn Gage, MD

## 2014-02-28 NOTE — Patient Instructions (Signed)
I have no explanation for the intermittent left sided numbness.  But you do appear to have neuropathy that was not present a year ago.  We will at least start with checking for neuropathy. 1.  We will schedule a nerve conduction study test of the left arm and leg to evaluate for neuropathy 2.  We will check lab work looking for common causes of neuropathy 3.  For pain, I will prescribe gabapentin 100mg .  You may take three times a day.  If you experience side effects, you can take once at bedtime for 7 days and then increase to once in the morning and once at bedtime for 7 days, and then increase to three times a day. 4.  Follow up after testing.

## 2014-03-01 LAB — TSH: TSH: 5.528 u[IU]/mL — ABNORMAL HIGH (ref 0.350–4.500)

## 2014-03-01 LAB — VITAMIN B12: VITAMIN B 12: 770 pg/mL (ref 211–911)

## 2014-03-01 LAB — ANTI-NUCLEAR AB-TITER (ANA TITER): ANA Titer 1: 1:40 {titer} — ABNORMAL HIGH

## 2014-03-01 LAB — SEDIMENTATION RATE: Sed Rate: 1 mm/hr (ref 0–16)

## 2014-03-01 LAB — ANA: Anti Nuclear Antibody(ANA): POSITIVE — AB

## 2014-03-02 LAB — SPEP & IFE WITH QIG
Albumin ELP: 55 % — ABNORMAL LOW (ref 55.8–66.1)
Alpha-1-Globulin: 7.9 % — ABNORMAL HIGH (ref 2.9–4.9)
Alpha-2-Globulin: 9.7 % (ref 7.1–11.8)
BETA GLOBULIN: 6.5 % (ref 4.7–7.2)
Beta 2: 4.2 % (ref 3.2–6.5)
Gamma Globulin: 16.7 % (ref 11.1–18.8)
IGA: 261 mg/dL (ref 68–379)
IGM, SERUM: 146 mg/dL (ref 41–251)
IgG (Immunoglobin G), Serum: 1290 mg/dL (ref 650–1600)
TOTAL PROTEIN, SERUM ELECTROPHOR: 7.4 g/dL (ref 6.0–8.3)

## 2014-03-05 ENCOUNTER — Telehealth: Payer: Self-pay | Admitting: *Deleted

## 2014-03-05 NOTE — Telephone Encounter (Signed)
Message copied by Fredirick MaudlinVAN DER GLAS, Tassie Pollett E on Mon Mar 05, 2014  8:23 AM ------      Message from: JAFFE, ADAM R      Created: Fri Mar 02, 2014  4:39 PM       Labs were unremarkable.  ANA was weakly positive but not clinically significant.      ----- Message -----         From: [facility] in [facility] Interface         Sent: 03/02/2014   1:39 PM           To: Cira ServantAdam Robert Jaffe, DO                   ------

## 2014-03-05 NOTE — Telephone Encounter (Signed)
Patient is aware of normal labs  

## 2014-03-06 ENCOUNTER — Encounter (HOSPITAL_COMMUNITY): Payer: Self-pay | Admitting: Emergency Medicine

## 2014-03-06 ENCOUNTER — Emergency Department (HOSPITAL_COMMUNITY): Payer: Medicare Other

## 2014-03-06 ENCOUNTER — Observation Stay (HOSPITAL_COMMUNITY)
Admission: EM | Admit: 2014-03-06 | Discharge: 2014-03-08 | Disposition: A | Discharge status: Home / Self Care | Payer: Medicare Other | Attending: Internal Medicine | Admitting: Internal Medicine

## 2014-03-06 DIAGNOSIS — E7800 Pure hypercholesterolemia, unspecified: Secondary | ICD-10-CM | POA: Diagnosis not present

## 2014-03-06 DIAGNOSIS — I509 Heart failure, unspecified: Secondary | ICD-10-CM | POA: Insufficient documentation

## 2014-03-06 DIAGNOSIS — G3183 Dementia with Lewy bodies: Secondary | ICD-10-CM

## 2014-03-06 DIAGNOSIS — I4891 Unspecified atrial fibrillation: Secondary | ICD-10-CM | POA: Insufficient documentation

## 2014-03-06 DIAGNOSIS — M1710 Unilateral primary osteoarthritis, unspecified knee: Secondary | ICD-10-CM | POA: Insufficient documentation

## 2014-03-06 DIAGNOSIS — IMO0002 Reserved for concepts with insufficient information to code with codable children: Secondary | ICD-10-CM | POA: Diagnosis not present

## 2014-03-06 DIAGNOSIS — R42 Dizziness and giddiness: Secondary | ICD-10-CM | POA: Diagnosis present

## 2014-03-06 DIAGNOSIS — E039 Hypothyroidism, unspecified: Secondary | ICD-10-CM | POA: Insufficient documentation

## 2014-03-06 DIAGNOSIS — M129 Arthropathy, unspecified: Secondary | ICD-10-CM | POA: Insufficient documentation

## 2014-03-06 DIAGNOSIS — Z87891 Personal history of nicotine dependence: Secondary | ICD-10-CM | POA: Insufficient documentation

## 2014-03-06 DIAGNOSIS — I2510 Atherosclerotic heart disease of native coronary artery without angina pectoris: Secondary | ICD-10-CM | POA: Diagnosis not present

## 2014-03-06 DIAGNOSIS — Z6841 Body Mass Index (BMI) 40.0 and over, adult: Secondary | ICD-10-CM | POA: Insufficient documentation

## 2014-03-06 DIAGNOSIS — I517 Cardiomegaly: Secondary | ICD-10-CM | POA: Insufficient documentation

## 2014-03-06 DIAGNOSIS — I482 Chronic atrial fibrillation, unspecified: Secondary | ICD-10-CM

## 2014-03-06 DIAGNOSIS — D539 Nutritional anemia, unspecified: Secondary | ICD-10-CM

## 2014-03-06 DIAGNOSIS — I10 Essential (primary) hypertension: Secondary | ICD-10-CM | POA: Insufficient documentation

## 2014-03-06 DIAGNOSIS — I5033 Acute on chronic diastolic (congestive) heart failure: Secondary | ICD-10-CM | POA: Diagnosis not present

## 2014-03-06 DIAGNOSIS — F0280 Dementia in other diseases classified elsewhere without behavioral disturbance: Secondary | ICD-10-CM | POA: Insufficient documentation

## 2014-03-06 DIAGNOSIS — R200 Anesthesia of skin: Secondary | ICD-10-CM

## 2014-03-06 DIAGNOSIS — G629 Polyneuropathy, unspecified: Secondary | ICD-10-CM

## 2014-03-06 DIAGNOSIS — I359 Nonrheumatic aortic valve disorder, unspecified: Secondary | ICD-10-CM | POA: Diagnosis not present

## 2014-03-06 DIAGNOSIS — I5041 Acute combined systolic (congestive) and diastolic (congestive) heart failure: Secondary | ICD-10-CM

## 2014-03-06 DIAGNOSIS — D7589 Other specified diseases of blood and blood-forming organs: Secondary | ICD-10-CM | POA: Diagnosis not present

## 2014-03-06 DIAGNOSIS — F411 Generalized anxiety disorder: Secondary | ICD-10-CM | POA: Diagnosis not present

## 2014-03-06 DIAGNOSIS — Z7982 Long term (current) use of aspirin: Secondary | ICD-10-CM | POA: Insufficient documentation

## 2014-03-06 DIAGNOSIS — G609 Hereditary and idiopathic neuropathy, unspecified: Secondary | ICD-10-CM | POA: Diagnosis not present

## 2014-03-06 DIAGNOSIS — Z7901 Long term (current) use of anticoagulants: Secondary | ICD-10-CM | POA: Insufficient documentation

## 2014-03-06 DIAGNOSIS — N400 Enlarged prostate without lower urinary tract symptoms: Secondary | ICD-10-CM | POA: Diagnosis not present

## 2014-03-06 DIAGNOSIS — Z86711 Personal history of pulmonary embolism: Secondary | ICD-10-CM | POA: Diagnosis not present

## 2014-03-06 DIAGNOSIS — G20 Parkinson's disease: Secondary | ICD-10-CM

## 2014-03-06 DIAGNOSIS — I350 Nonrheumatic aortic (valve) stenosis: Secondary | ICD-10-CM | POA: Diagnosis present

## 2014-03-06 LAB — CBC
HCT: 40.1 % (ref 39.0–52.0)
Hemoglobin: 13.5 g/dL (ref 13.0–17.0)
MCH: 35.2 pg — AB (ref 26.0–34.0)
MCHC: 33.7 g/dL (ref 30.0–36.0)
MCV: 104.7 fL — ABNORMAL HIGH (ref 78.0–100.0)
PLATELETS: 128 10*3/uL — AB (ref 150–400)
RBC: 3.83 MIL/uL — ABNORMAL LOW (ref 4.22–5.81)
RDW: 15 % (ref 11.5–15.5)
WBC: 4.2 10*3/uL (ref 4.0–10.5)

## 2014-03-06 LAB — COMPREHENSIVE METABOLIC PANEL
ALBUMIN: 3.5 g/dL (ref 3.5–5.2)
ALT: 7 U/L (ref 0–53)
ANION GAP: 11 (ref 5–15)
AST: 23 U/L (ref 0–37)
Alkaline Phosphatase: 69 U/L (ref 39–117)
BUN: 18 mg/dL (ref 6–23)
CO2: 28 mEq/L (ref 19–32)
Calcium: 8.8 mg/dL (ref 8.4–10.5)
Chloride: 102 mEq/L (ref 96–112)
Creatinine, Ser: 1.11 mg/dL (ref 0.50–1.35)
GFR calc non Af Amer: 60 mL/min — ABNORMAL LOW (ref >90)
GFR, EST AFRICAN AMERICAN: 69 mL/min — AB (ref >90)
GLUCOSE: 98 mg/dL (ref 70–99)
Potassium: 3.9 mEq/L (ref 3.7–5.3)
Sodium: 141 mEq/L (ref 137–147)
TOTAL PROTEIN: 6.5 g/dL (ref 6.0–8.3)
Total Bilirubin: 0.6 mg/dL (ref 0.3–1.2)

## 2014-03-06 LAB — PROTIME-INR
INR: 2.64 — ABNORMAL HIGH (ref 0.00–1.49)
PROTHROMBIN TIME: 28.2 s — AB (ref 11.6–15.2)

## 2014-03-06 LAB — VITAMIN B12: Vitamin B-12: 914 pg/mL — ABNORMAL HIGH (ref 211–911)

## 2014-03-06 LAB — PRO B NATRIURETIC PEPTIDE: Pro B Natriuretic peptide (BNP): 3135 pg/mL — ABNORMAL HIGH (ref 0–450)

## 2014-03-06 LAB — I-STAT TROPONIN, ED: TROPONIN I, POC: 0.01 ng/mL (ref 0.00–0.08)

## 2014-03-06 LAB — TROPONIN I: Troponin I: <0.3 ng/mL (ref <0.30)

## 2014-03-06 LAB — MRSA PCR SCREENING: MRSA BY PCR: NEGATIVE

## 2014-03-06 MED ORDER — ALLOPURINOL 300 MG PO TABS
300.0000 mg | ORAL_TABLET | Freq: Every day | ORAL | Status: DC
  Administered 2014-03-07 – 2014-03-08 (×2): 300 mg via ORAL
  Filled 2014-03-06 (×2): qty 1

## 2014-03-06 MED ORDER — FLUTICASONE PROPIONATE 50 MCG/ACT NA SUSP
1.0000 | Freq: Every day | NASAL | Status: DC | PRN
  Filled 2014-03-06: qty 16

## 2014-03-06 MED ORDER — LORAZEPAM 1 MG PO TABS: 1.0000 mg | ORAL_TABLET | Freq: Two times a day (BID) | ORAL | Status: DC | PRN

## 2014-03-06 MED ORDER — SIMVASTATIN 10 MG PO TABS
10.0000 mg | ORAL_TABLET | Freq: Every day | ORAL | Status: DC
  Administered 2014-03-06 – 2014-03-08 (×3): 10 mg via ORAL
  Filled 2014-03-06 (×3): qty 1

## 2014-03-06 MED ORDER — ASPIRIN 81 MG PO CHEW
81.0000 mg | CHEWABLE_TABLET | Freq: Every day | ORAL | Status: DC
  Administered 2014-03-07 – 2014-03-08 (×2): 81 mg via ORAL
  Filled 2014-03-06 (×2): qty 1

## 2014-03-06 MED ORDER — WARFARIN - PHARMACIST DOSING INPATIENT: Freq: Every day | Status: DC

## 2014-03-06 MED ORDER — SUCRALFATE 1 G PO TABS
1.0000 g | ORAL_TABLET | Freq: Two times a day (BID) | ORAL | Status: DC
  Administered 2014-03-06 – 2014-03-08 (×4): 1 g via ORAL
  Filled 2014-03-06 (×5): qty 1

## 2014-03-06 MED ORDER — SENNA 8.6 MG PO TABS: 3.0000 | ORAL_TABLET | Freq: Every day | ORAL | Status: DC

## 2014-03-06 MED ORDER — FUROSEMIDE 80 MG PO TABS
80.0000 mg | ORAL_TABLET | Freq: Every day | ORAL | Status: DC
  Administered 2014-03-07 – 2014-03-08 (×2): 80 mg via ORAL
  Filled 2014-03-06 (×2): qty 1

## 2014-03-06 MED ORDER — ACETAMINOPHEN 325 MG PO TABS: 650.0000 mg | ORAL_TABLET | Freq: Four times a day (QID) | ORAL | Status: DC | PRN

## 2014-03-06 MED ORDER — GABAPENTIN 100 MG PO CAPS
100.0000 mg | ORAL_CAPSULE | Freq: Three times a day (TID) | ORAL | Status: DC
  Filled 2014-03-06 (×2): qty 1

## 2014-03-06 MED ORDER — WARFARIN SODIUM 5 MG PO TABS
5.0000 mg | ORAL_TABLET | ORAL | Status: DC
  Administered 2014-03-06 – 2014-03-07 (×2): 5 mg via ORAL
  Filled 2014-03-06 (×3): qty 1

## 2014-03-06 MED ORDER — POLYETHYLENE GLYCOL 3350 17 G PO PACK
17.0000 g | PACK | Freq: Every evening | ORAL | Status: DC
  Administered 2014-03-07 – 2014-03-08 (×2): 17 g via ORAL
  Filled 2014-03-06 (×3): qty 1

## 2014-03-06 MED ORDER — METOPROLOL SUCCINATE ER 50 MG PO TB24
50.0000 mg | ORAL_TABLET | Freq: Every day | ORAL | Status: DC
  Administered 2014-03-07 – 2014-03-08 (×2): 50 mg via ORAL
  Filled 2014-03-06 (×2): qty 1

## 2014-03-06 MED ORDER — WARFARIN SODIUM 2.5 MG PO TABS: 2.5000 mg | ORAL_TABLET | ORAL | Status: DC

## 2014-03-06 MED ORDER — ACETAMINOPHEN ER 650 MG PO TBCR: 650.0000 mg | EXTENDED_RELEASE_TABLET | ORAL | Status: DC | PRN

## 2014-03-06 MED ORDER — FUROSEMIDE 10 MG/ML IJ SOLN
40.0000 mg | Freq: Once | INTRAMUSCULAR | Status: AC
  Administered 2014-03-06: 40 mg via INTRAVENOUS
  Filled 2014-03-06: qty 4

## 2014-03-06 MED ORDER — TAMSULOSIN HCL 0.4 MG PO CAPS
0.4000 mg | ORAL_CAPSULE | Freq: Every day | ORAL | Status: DC
  Administered 2014-03-06 – 2014-03-08 (×3): 0.4 mg via ORAL
  Filled 2014-03-06 (×3): qty 1

## 2014-03-06 MED ORDER — SENNOSIDES 8.6 MG PO TABS: 3.0000 | ORAL_TABLET | Freq: Every day | ORAL | Status: DC

## 2014-03-06 MED ORDER — CARBIDOPA-LEVODOPA ER 50-200 MG PO TBCR
1.0000 | EXTENDED_RELEASE_TABLET | Freq: Three times a day (TID) | ORAL | Status: DC
  Administered 2014-03-06 – 2014-03-08 (×7): 1 via ORAL
  Filled 2014-03-06 (×8): qty 1

## 2014-03-06 MED ORDER — SODIUM CHLORIDE 0.9 % IJ SOLN
3.0000 mL | Freq: Two times a day (BID) | INTRAMUSCULAR | Status: DC
  Administered 2014-03-06 – 2014-03-08 (×4): 3 mL via INTRAVENOUS

## 2014-03-06 MED ORDER — PANTOPRAZOLE SODIUM 40 MG PO TBEC
40.0000 mg | DELAYED_RELEASE_TABLET | Freq: Every day | ORAL | Status: DC
  Administered 2014-03-06 – 2014-03-08 (×3): 40 mg via ORAL
  Filled 2014-03-06 (×2): qty 1

## 2014-03-06 MED ORDER — LEVOTHYROXINE SODIUM 175 MCG PO TABS
175.0000 ug | ORAL_TABLET | Freq: Every day | ORAL | Status: DC
  Administered 2014-03-07 – 2014-03-08 (×2): 175 ug via ORAL
  Filled 2014-03-06 (×3): qty 1

## 2014-03-06 NOTE — H&P (Signed)
  I have seen and examined the patient myself, and I have reviewed the note by Lillia CarmelNida Waheed, MS 4 and was present during the interview and physical exam.  Please see my separate H&P for additional findings, assessment, and plan.   Signed: Ky BarbanSolianny D Coben Godshall, MD 03/06/2014, 8:12 PM

## 2014-03-06 NOTE — ED Notes (Signed)
Dr. Gwendolyn GrantWalden at bedside updating pt on plan of care.

## 2014-03-06 NOTE — ED Notes (Signed)
Patient transported to X-ray 

## 2014-03-06 NOTE — H&P (Signed)
Date: 03/06/2014               Patient Name:  [name] Graham MRN: [id number]  DOB: 1929-01-09 Age / Sex: 80 y.o., male   PCP: Thelma Comp, MD         Medical Service: Internal Medicine Teaching Service         Attending Physician: Dr. Judyann Munson, MD    First Contact: Lillia Carmel, MS4 Pager: [phone]  Second Contact: Dr. Garald Braver Pager: [phone]       After Hours (After 5p/  First Contact Pager: [phone]  weekends / holidays): Second Contact Pager: [phone]   Chief Complaint: Dizziness  History of Present Illness:  [name] Graham is an 80 year old man with PMH of severe aortic stenosis, chronic heart failure, atrial fibrillation on warfarin, Parkinson's disease with mild dementia,  DM II, peripheral neuropathy, HTN, HLD, hypothyroidism, and IVC filter due to hx of PE who presented to the ED for dizziness. He states that this morning he walked about 150 feet and started feeling dizzy with blurry vision that lasted for about 2 hours but subsided by the time he arrived in the ED. He reports that for the past two weeks he has been feeling dizzy with occasional lightheadedness in the morning. He had started a new medication for "arthritis" 2 weeks ago that caused severe dizziness but he states that he stopped this medication, he does not know the name of this medication. He was started on gabapentin by his Neurologist last week but reports hat his neuropathy symptoms have not improved.  He denies shortness of breath. He does not know his dry weight and does not weight himself daily. For the past week he reports normal intake per mouth and actually increased water intake yesterday with extra 2 cups as he had been instructed by his Cardiologist. 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 His diet has been high in sodium, he explains that he usually eats a ham biscuit almost every morning.    In the ED, he received furosemide 40mg   IV with UOP of ~1L. His pro-BNP was elevated to 3125, which is high for him as his baseline is around 2000. Troponin x1 negative. EKG with rate controlled A.fib but no changes. CXR with mild pulmonary vascular prominence. He had O2 saturation stable at 99% on room air. His weight is 279lbs here and he weighed 287lbs during his last Cardiology visit on 7/28 when he was found to be euvolemic.    Medications Prior to Admission  Medication Sig Dispense Refill  . acetaminophen (ARTHRITIS PAIN RELIEF) 650 MG CR tablet Take 650 mg by mouth. Take 2 tabs AM and 1 tab PM       . allopurinol (ZYLOPRIM) 300 MG tablet Take 300 mg by mouth daily.        Marland Kitchen aspirin 81 MG chewable tablet Chew 81 mg by mouth daily.      . carbidopa-levodopa (SINEMET CR) 50-200 MG per tablet Take 1 tablet by mouth 3 (three) times daily.  90 tablet  0  . fluticasone (FLONASE) 50 MCG/ACT nasal spray Place 1 spray into both nostrils daily as needed for allergies.       . furosemide (LASIX) 80 MG tablet Take 1 tablet (80 mg total) by mouth daily.  30 tablet  1  . gabapentin (NEURONTIN) 100 MG capsule Take 1 capsule (100 mg total) by  mouth 3 (three) times daily.  90 capsule  0  . levothyroxine (SYNTHROID) 175 MCG tablet Take 175 mcg by mouth daily.        Marland Kitchen. LORazepam (ATIVAN) 1 MG tablet Take 1 tablet (1 mg total) by mouth 2 (two) times daily as needed for anxiety.  30 tablet  0  . metoprolol (TOPROL XL) 50 MG 24 hr tablet Take 50 mg by mouth daily.        . Misc Natural Products (OSTEO BI-FLEX ADV DOUBLE ST PO) Take 2 tablets by mouth daily.       . Omega-3 Fatty Acids (FISH OIL) 1000 MG CAPS Take 1 capsule by mouth 2 (two) times daily.       . pantoprazole (PROTONIX) 40 MG tablet Take 40 mg by mouth daily.       . polyethylene glycol (MIRALAX / GLYCOLAX) packet Take 17 g by mouth every evening.      . potassium chloride SA (K-DUR,KLOR-CON) 20 MEQ tablet Take 20 mEq by mouth daily.      . pravastatin (PRAVACHOL) 20 MG tablet Take 20 mg  by mouth daily.       Marland Kitchen. senna (SENOKOT) 8.6 MG tablet Take 3 tablets by mouth at bedtime.      . sucralfate (CARAFATE) 1 G tablet Take 1 g by mouth 2 (two) times daily.        . tamsulosin (FLOMAX) 0.4 MG CAPS capsule Take 0.4 mg by mouth daily after supper.       . warfarin (COUMADIN) 2.5 MG tablet Take 2.5-5 mg by mouth daily at 6 PM. Takes 2.5mg  (1 tablet) daily except 5mg  (2 tablets) on Monday and Friday       Meds: Current Facility-Administered Medications  Medication Dose Route Frequency Provider Last Rate Last Dose  . acetaminophen (TYLENOL) tablet 650 mg  650 mg Oral Q6H PRN Judyann Munsonynthia Snider, MD      . Melene Muller[START ON 03/07/2014] allopurinol (ZYLOPRIM) tablet 300 mg  300 mg Oral Daily Ky BarbanSolianny D Garland Smouse, MD      . Melene Muller[START ON 03/07/2014] aspirin chewable tablet 81 mg  81 mg Oral Daily Ky BarbanSolianny D Jaxston Chohan, MD      . carbidopa-levodopa (SINEMET CR) 50-200 MG per tablet controlled release 1 tablet  1 tablet Oral TID Ky BarbanSolianny D Adelbert Gaspard, MD   1 tablet at 03/06/14 1801  . fluticasone (FLONASE) 50 MCG/ACT nasal spray 1 spray  1 spray Each Nare Daily PRN Ky BarbanSolianny D Rylen Hou, MD      . Melene Muller[START ON 03/07/2014] furosemide (LASIX) tablet 80 mg  80 mg Oral Daily Ky BarbanSolianny D Loriann Bosserman, MD      . gabapentin (NEURONTIN) capsule 100 mg  100 mg Oral TID Ky BarbanSolianny D Rosendo Couser, MD      . Melene Muller[START ON 03/07/2014] levothyroxine (SYNTHROID, LEVOTHROID) tablet 175 mcg  175 mcg Oral QAC breakfast Ky BarbanSolianny D Carrick Rijos, MD      . LORazepam (ATIVAN) tablet 1 mg  1 mg Oral BID PRN Ky BarbanSolianny D Juwaun Inskeep, MD      . Melene Muller[START ON 03/07/2014] metoprolol succinate (TOPROL-XL) 24 hr tablet 50 mg  50 mg Oral Daily Ky BarbanSolianny D Javoris Star, MD      . pantoprazole (PROTONIX) EC tablet 40 mg  40 mg Oral Daily Ky BarbanSolianny D Manual Navarra, MD   40 mg at 03/06/14 1801  . polyethylene glycol (MIRALAX / GLYCOLAX) packet 17 g  17 g Oral QPM Ky BarbanSolianny D Kacie Huxtable, MD      . senna (SENOKOT) tablet 25.8 mg  3 tablet Oral QHS Judyann Munson, MD      . simvastatin (ZOCOR) tablet 10  mg  10 mg Oral q1800 Ky Barban, MD   10 mg at 03/06/14 1801  . sodium chloride 0.9 % injection 3 mL  3 mL Intravenous Q12H Ky Barban, MD      . sucralfate (CARAFATE) tablet 1 g  1 g Oral BID Ky Barban, MD      . tamsulosin (FLOMAX) capsule 0.4 mg  0.4 mg Oral QPC supper Ky Barban, MD   0.4 mg at 03/06/14 1801  . warfarin (COUMADIN) tablet 5 mg  5 mg Oral Once per day on Sun Tue Wed Thu Sat Herby Abraham, RPH   5 mg at 03/06/14 1802   And  . [START ON 03/09/2014] warfarin (COUMADIN) tablet 2.5 mg  2.5 mg Oral Once per day on Mon Fri Herby Abraham, [facility]      . Warfarin - Pharmacist Dosing Inpatient   Does not apply q1800 Herby Abraham, [facility]        Allergies: Allergies as of 03/06/2014 - Review Complete 03/06/2014  Allergen Reaction Noted  . Morphine and related Other (See Comments) 01/22/2012  . Nifedipine Other (See Comments) 03/07/2009   Past Medical History  Diagnosis Date  . Pulmonary embolus     bilateral  . Hypothyroid   . Hypertension   . Chronic atrial fibrillation     INR therpeutic on Coumadin  . Dementia     mild, with hallucinations status post psychiatric evaluation during admission  . Kidney cyst, acquired   . A-fib   . CAD (coronary artery disease)     non-obstructive by cath 2006, no stents, ejection fraction  . Leukocytosis   . Tremor   . Parkinson disease   . Severe aortic stenosis   . HYPERCHOLESTEROLEMIA  IIA 12/20/2008    Qualifier: Diagnosis of  By: [name], MD, Johny Sax   . Peripheral neuropathy   . Weakness of both legs 03/18/2012  . Macrocytic anemia 03/19/2012  . Urinary incontinence 03/28/2012  . Parkinson's disease dementia 01/20/2013  . Limited mobility   . Morbid obesity with BMI of 40.0-44.9, adult   . Chronic diastolic congestive heart failure    Past Surgical History  Procedure Laterality Date  . Mole removal     Family History  Problem Relation Age of Onset  . Heart failure Mother   .  Microcephaly Father    History   Social History  . Marital Status: Married    Spouse Name: N/A    Number of Children: 6  . Years of Education: 12   Occupational History  . retired    Social History Main Topics  . Smoking status: Former Smoker -- 2.00 packs/day for 12 years    Types: Cigarettes  . Smokeless tobacco: Never Used     Comment: Quit 40 years ago by report.  . Alcohol Use: No  . Drug Use: No  . Sexual Activity: No   Other Topics Concern  . Not on file   Social History Narrative   PCP is Renae Fickle, Cardiologist is Dr. Excell Seltzer. He receives Medicare.    Power of [name] is his son: [name] Graham - [phone].    Caretaker is [name] Graham: [phone].      Originally from [place], completed high school. 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    He  then lived with wife in [place] after her heart valve replacement.    Wife passed away on 18-Aug-2013. He now lives in [place], [place] again.   He had a home health nurse caring for him. His daughter had been living with he and his wife since his wife had a valve replacement in 6/13.      Has not smoked in 40 years.     Review of Systems: Review of Systems  Constitutional: Negative for fever, chills, weight loss, malaise/fatigue and diaphoresis.  Eyes: Positive for blurred vision. Negative for pain.  Respiratory: Negative for cough.   Cardiovascular: Negative for chest pain, palpitations and leg swelling.  Gastrointestinal: Negative for nausea, vomiting, abdominal pain and diarrhea.  Genitourinary: Negative for dysuria, frequency, hematuria and flank pain.       Difficulty emptying his bladder  Musculoskeletal: Negative for myalgias.  Skin: Negative for rash.  Neurological: Positive for dizziness. Negative for weakness and headaches.       Morning lightheadedness   Psychiatric/Behavioral: Negative for depression and suicidal ideas.    Physical Exam: Blood  pressure 158/81, pulse 75, temperature 98.6 F (37 C), temperature source Oral, resp. rate 16, height 6' (1.829 m), weight 279 lb 11.2 oz (126.871 kg), SpO2 99.00%. Physical Exam  Constitutional: He is oriented to person, place, and time. He appears well-nourished. No distress.  Cardiovascular: Normal rate.   Irregularly irregular rhythm   Respiratory: Effort normal. No respiratory distress. He has no wheezes. He has no rales.  GI: Soft. Bowel sounds are normal. He exhibits no distension. There is no tenderness. There is no rebound.  Musculoskeletal:  Trace pitting edema bilaterally up to his knees.   Neurological: He is alert and oriented to person, place, and time. No cranial nerve deficit.  Skin: Skin is warm and dry. No rash noted. He is not diaphoretic. No erythema.  Psychiatric: He has a normal mood and affect. His behavior is normal.     Lab results: Basic Metabolic Panel:  Recent Labs  96/04/54 1137  NA 141  K 3.9  CL 102  CO2 28  GLUCOSE 98  BUN 18  CREATININE 1.11  CALCIUM 8.8   Liver Function Tests:  Recent Labs  03/06/14 1137  AST 23  ALT 7  ALKPHOS 69  BILITOT 0.6  PROT 6.5  ALBUMIN 3.5   CBC:  Recent Labs  03/06/14 1137  WBC 4.2  HGB 13.5  HCT 40.1  MCV 104.7*  PLT 128*   BNP:  Recent Labs  03/06/14 1137  PROBNP 3135.0*   Coagulation:  Recent Labs  03/06/14 1137  LABPROT 28.2*  INR 2.64*   Urine Drug Screen: Drugs of Abuse     Component Value Date/Time   LABOPIA NONE DETECTED 02/24/2014 1636   COCAINSCRNUR NONE DETECTED 02/24/2014 1636   LABBENZ NONE DETECTED 02/24/2014 1636   AMPHETMU NONE DETECTED 02/24/2014 1636   THCU NONE DETECTED 02/24/2014 1636   LABBARB NONE DETECTED 02/24/2014 1636    Imaging results:  Dg Chest 2 View  03/06/2014   CLINICAL DATA:  Dizziness and weakness.  EXAM: CHEST  2 VIEW  COMPARISON:  Chest x-ray 03/18/2012.  FINDINGS: Mediastinum and hilar structures normal. Severe cardiomegaly. Mild pulmonary  vascular prominence. No focal pulmonary infiltrate. No pleural effusion or pneumothorax. No acute bony abnormality.  IMPRESSION: 1. Severe cardiomegaly with mild pulmonary vascular prominence. 2. No focal pulmonary infiltrate.   Electronically Signed   By: Maisie Fus  Register   On: 03/06/2014 12:40  Other results: ED ECG REPORT   Date: 03/06/2014  EKG Time: 11:18  Rate: 75  Rhythm: atrial flutter,    Axis: LAD  Intervals:left anterior fascicular block  ST&T Change: q wave in V1-V3  Narrative Interpretation: No changes since previous tracing      Assessment & Plan by Problem: 80 yo male with multiple chronic diseases, including severe aortic stenosis, atrial fibrillation, and HTN, who presented to the ED for likely acute on chronic diastolic heart failure due to volume overload.  Dizziness: Unclear etiology. Medication induced (gabapentin, lorazepam, and flomax) are likely to cause his morning dizziness. Orthostatic hypotension possible but unlikely as he was slightly volume overloaded on initial exam. He presented to the ED with similar symptoms of 7/25 and had CT and MRI brain negative for acute intracranial abnormalities at that time. He follow up with outpatient Neurology (Dr. Everlena Cooper) with recommendations for NCV-EMG of the left upper and lower extremities, and testing for B12, TSH, ANA, Sed Rate, SPEP/IFE. Cardiology etiology, including arrhythmia and ACS is possible given his PMH of severe AS and A. Fib.   -Admit to telemetry -cycle troponin x3 -repeat EKG in AM -continue warfarin per pharmacy -Discontinue gabapentin -Check folate -PT eval  Acute on Chronic diastolic heart failure: Mild. He was mildly overloaded in the ED but diuresed over 1L after receiving 40mg  IV lasix. He is on Lasix 80mg  daily at home and reports compliance with this medication. He reports dietary indiscretion with ham biscuit every morning. On physical exam he appears close to euvolemic after receiving IV Lasix.    -Strict I&Os -Resume home Lasix 80mg  daily -BMET in am   HTN: Mildly elevated on presentation at 151/81  -Continue home Metoprolol XL 50 mg PO, daily   CAD: Continue home Aspirin 81 mg PO daily, Simvastatin 10mg  PO daily.   Chronic Atrial Fibrillation: His INR is therapeutic INR is 2.64.  -warfarin per pharmacy   Hypothyroidism: Last TSH of 5.528, slightly elevated on 7/29. Continue home synthroid at daily, consider increasing it to daily.   Macrocytosis: MCV 104.7 but normal Hg 13.5. B12 normal at 770 on 7/29. -f/u folate -he reports taking B12 supplementation but is not listed in his home meds  Parkinson's Disease: with mild dementia but A&O x3. Continue home meds  -carbidopa-levodopa 50-200 TID   Osteoarthritis of knees: Continue home Acetaminophen 650mg  PO, PRN pain   Anxiety: Continue home Lorazepam 1mg  PO BID prn   BPH: Continue tamsulosin 0.4 mg cap   DVT prophylaxis: on warfarin, therapeutic  FEN/GI:  NSL Low sodium diet pantoprazole  Miralax    Dispo: Disposition is deferred at this time, awaiting improvement of current medical problems. Anticipated discharge in approximately 1-2 day(s).   The patient does have a current PCP (Thelma Comp, MD) and does not need an [facility] hospital follow-up appointment after discharge.  The patient does have transportation limitations that hinder transportation to clinic appointments.  Signed: Ky Barban, MD 03/06/2014, 6:38 PM

## 2014-03-06 NOTE — ED Notes (Addendum)
Per EMS, pt comes from home with c/o generalized weakness and feelings of numbness in head on Sunday and tingling on left side. No numbness or tingling in route. Pt A&Ox4, NAD noted. Pt c/o feeling weak, denies chest pain or n/v/d. Pt accompanied by sister. Pt has h/o automatic neuropathy, parkisons, and TIAs. Pt has h/o afib on monitor. VSS: BP 122/80,P 70, afebrile, CBG 117.

## 2014-03-06 NOTE — ED Notes (Addendum)
Attempted report 

## 2014-03-06 NOTE — Progress Notes (Signed)
ANTICOAGULATION CONSULT NOTE - Initial Consult  Pharmacy Consult for coumadin Indication: atrial fibrillation  Allergies  Allergen Reactions  . Morphine And Related Other (See Comments)    Goes Crazy  . Nifedipine Other (See Comments)    "makes me go crazy" procardia    Patient Measurements: Height: 6' (182.9 cm) Weight: 279 lb 11.2 oz (126.871 kg) IBW/kg (Calculated) : 77.6   Vital Signs: Temp: 98.6 F (37 C) (08/04 1430) Temp src: Oral (08/04 1430) BP: 158/81 mmHg (08/04 1430) Pulse Rate: 75 (08/04 1430)  Labs:  Recent Labs  03/06/14 1137  HGB 13.5  HCT 40.1  PLT 128*  LABPROT 28.2*  INR 2.64*  CREATININE 1.11    Estimated Creatinine Clearance: 70.6 ml/min (by C-G formula based on Cr of 1.11).   Medical History: Past Medical History  Diagnosis Date  . Pulmonary embolus     bilateral  . Hypothyroid   . Hypertension   . Chronic atrial fibrillation     INR therpeutic on Coumadin  . Dementia     mild, with hallucinations status post psychiatric evaluation during admission  . Kidney cyst, acquired   . A-fib   . CAD (coronary artery disease)     non-obstructive by cath 2006, no stents, ejection fraction  . Leukocytosis   . Tremor   . Parkinson disease   . Severe aortic stenosis   . HYPERCHOLESTEROLEMIA  IIA 12/20/2008    Qualifier: Diagnosis of  By: [name], MD, Johny Sax   . Peripheral neuropathy   . Weakness of both legs 03/18/2012  . Macrocytic anemia 03/19/2012  . Urinary incontinence 03/28/2012  . Parkinson's disease dementia 01/20/2013  . Limited mobility   . Morbid obesity with BMI of 40.0-44.9, adult   . Chronic diastolic congestive heart failure     Medications:  Prescriptions prior to admission  Medication Sig Dispense Refill  . acetaminophen (ARTHRITIS PAIN RELIEF) 650 MG CR tablet Take 650 mg by mouth. Take 2 tabs AM and 1 tab PM       . allopurinol (ZYLOPRIM) 300 MG tablet Take 300 mg by mouth daily.        Marland Kitchen aspirin 81 MG  chewable tablet Chew 81 mg by mouth daily.      . carbidopa-levodopa (SINEMET CR) 50-200 MG per tablet Take 1 tablet by mouth 3 (three) times daily.  90 tablet  0  . fluticasone (FLONASE) 50 MCG/ACT nasal spray Place 1 spray into both nostrils daily as needed for allergies.       . furosemide (LASIX) 80 MG tablet Take 1 tablet (80 mg total) by mouth daily.  30 tablet  1  . gabapentin (NEURONTIN) 100 MG capsule Take 1 capsule (100 mg total) by mouth 3 (three) times daily.  90 capsule  0  . levothyroxine (SYNTHROID) 175 MCG tablet Take 175 mcg by mouth daily.        Marland Kitchen LORazepam (ATIVAN) 1 MG tablet Take 1 tablet (1 mg total) by mouth 2 (two) times daily as needed for anxiety.  30 tablet  0  . metoprolol (TOPROL XL) 50 MG 24 hr tablet Take 50 mg by mouth daily.        . Misc Natural Products (OSTEO BI-FLEX ADV DOUBLE ST PO) Take 2 tablets by mouth daily.       . Omega-3 Fatty Acids (FISH OIL) 1000 MG CAPS Take 1 capsule by mouth 2 (two) times daily.       . pantoprazole (PROTONIX) 40 MG  tablet Take 40 mg by mouth daily.       . polyethylene glycol (MIRALAX / GLYCOLAX) packet Take 17 g by mouth every evening.      . potassium chloride SA (K-DUR,KLOR-CON) 20 MEQ tablet Take 20 mEq by mouth daily.      . pravastatin (PRAVACHOL) 20 MG tablet Take 20 mg by mouth daily.       Marland Kitchen. senna (SENOKOT) 8.6 MG tablet Take 3 tablets by mouth at bedtime.      . sucralfate (CARAFATE) 1 G tablet Take 1 g by mouth 2 (two) times daily.        . tamsulosin (FLOMAX) 0.4 MG CAPS capsule Take 0.4 mg by mouth daily after supper.       . warfarin (COUMADIN) 2.5 MG tablet Take 2.5-5 mg by mouth daily at 6 PM. Takes 2.5mg  (1 tablet) daily except 5mg  (2 tablets) on Monday and Friday        Assessment: 80 yo M admitted with CC of dizziness.  On coumadin PTA for Afib and also has hx of B PE.  Presents with "numbness in head" and tingling on left side. MRI 7/25 shows remote lacunar infarcts. INR 2.64. hgb 13.5, plts 128- last were  139 in July. 190s in 2014. Home dose 2.5 qday x 5 mg M,F.  Goal of Therapy:  INR 2-3 Monitor platelets by anticoagulation protocol: Yes   Plan:  Continue home dose of 2.5 mg daily x 5 mg M,F Daily INR for now Herby AbrahamMichelle T. Marselino Slayton, Pharm.D. [phone] 03/06/2014 4:29 PM

## 2014-03-06 NOTE — ED Provider Notes (Signed)
CSN: [id number]     Arrival date & time 03/06/14  1111 History   First MD Initiated Contact with Patient 03/06/14 1115     Chief Complaint  Patient presents with  . Generalized weakness      (Consider location/radiation/quality/duration/timing/severity/associated sxs/prior Treatment) Patient is a 80 y.o. male presenting with dizziness. The history is provided by the patient.  Dizziness Quality:  Lightheadedness and room spinning Severity:  Moderate Onset quality:  Sudden Duration:  3 hours Timing:  Constant Progression:  Resolved Chronicity:  New Context: physical activity   Relieved by:  Being still Worsened by:  Nothing tried Associated symptoms: no shortness of breath and no vomiting     Past Medical History  Diagnosis Date  . Pulmonary embolus     bilateral  . Hypothyroid   . Hypertension   . Chronic atrial fibrillation     INR therpeutic on Coumadin  . Dementia     mild, with hallucinations status post psychiatric evaluation during admission  . Kidney cyst, acquired   . A-fib   . CAD (coronary artery disease)     non-obstructive by cath 2006, no stents, ejection fraction  . Leukocytosis   . Tremor   . Parkinson disease   . Severe aortic stenosis   . HYPERCHOLESTEROLEMIA  IIA 12/20/2008    Qualifier: Diagnosis of  By: [name], MD, Johny Sax   . Peripheral neuropathy   . Weakness of both legs 03/18/2012  . Macrocytic anemia 03/19/2012  . Urinary incontinence 03/28/2012  . Parkinson's disease dementia 01/20/2013  . Limited mobility   . Morbid obesity with BMI of 40.0-44.9, adult   . Chronic diastolic congestive heart failure    Past Surgical History  Procedure Laterality Date  . Mole removal     Family History  Problem Relation Age of Onset  . Heart failure Mother   . Microcephaly Father    History  Substance Use Topics  . Smoking status: Former Smoker -- 2.00 packs/day for 12 years    Types: Cigarettes  . Smokeless tobacco: Never Used      Comment: Quit 40 years ago by report.  . Alcohol Use: No    Review of Systems  Constitutional: Negative for fever.  Respiratory: Negative for cough and shortness of breath.   Gastrointestinal: Negative for vomiting and abdominal pain.  Neurological: Positive for dizziness.  All other systems reviewed and are negative.     Allergies  Morphine and related and Nifedipine  Home Medications   Prior to Admission medications   Medication Sig Start Date End Date Taking? Authorizing Provider  acetaminophen (ARTHRITIS PAIN RELIEF) 650 MG CR tablet Take 650 mg by mouth. Take 2 tabs AM and 1 tab PM    Yes Historical Provider, MD  allopurinol (ZYLOPRIM) 300 MG tablet Take 300 mg by mouth daily.     Yes Historical Provider, MD  aspirin 81 MG chewable tablet Chew 81 mg by mouth daily.   Yes Historical Provider, MD  carbidopa-levodopa (SINEMET CR) 50-200 MG per tablet Take 1 tablet by mouth 3 (three) times daily. 03/29/12 03/06/14 Yes Leodis Sias, MD  fluticasone (FLONASE) 50 MCG/ACT nasal spray Place 1 spray into both nostrils daily as needed for allergies.  01/25/14  Yes Historical Provider, MD  furosemide (LASIX) 80 MG tablet Take 1 tablet (80 mg total) by mouth daily. 04/12/12  Yes Herby Abraham, MD  gabapentin (NEURONTIN) 100 MG capsule Take 1 capsule (100 mg total) by mouth 3 (three) times daily.  02/28/14  Yes Adam Gus Rankin, DO  levothyroxine (SYNTHROID) 175 MCG tablet Take 175 mcg by mouth daily.     Yes Historical Provider, MD  LORazepam (ATIVAN) 1 MG tablet Take 1 tablet (1 mg total) by mouth 2 (two) times daily as needed for anxiety. 03/29/12  Yes Leodis Sias, MD  metoprolol (TOPROL XL) 50 MG 24 hr tablet Take 50 mg by mouth daily.     Yes Historical Provider, MD  Misc Natural Products (OSTEO BI-FLEX ADV DOUBLE ST PO) Take 2 tablets by mouth daily.    Yes Historical Provider, MD  Omega-3 Fatty Acids (FISH OIL) 1000 MG CAPS Take 1 capsule by mouth 2 (two) times daily.    Yes  Historical Provider, MD  pantoprazole (PROTONIX) 40 MG tablet Take 40 mg by mouth daily.  01/07/13  Yes Historical Provider, MD  polyethylene glycol (MIRALAX / GLYCOLAX) packet Take 17 g by mouth every evening.   Yes Historical Provider, MD  potassium chloride SA (K-DUR,KLOR-CON) 20 MEQ tablet Take 20 mEq by mouth daily.   Yes Historical Provider, MD  pravastatin (PRAVACHOL) 20 MG tablet Take 20 mg by mouth daily.  09/22/13  Yes Historical Provider, MD  senna (SENOKOT) 8.6 MG tablet Take 3 tablets by mouth at bedtime.   Yes Historical Provider, MD  sucralfate (CARAFATE) 1 G tablet Take 1 g by mouth 2 (two) times daily.     Yes Historical Provider, MD  tamsulosin (FLOMAX) 0.4 MG CAPS capsule Take 0.4 mg by mouth daily after supper.  09/22/13  Yes Historical Provider, MD  warfarin (COUMADIN) 2.5 MG tablet Take 2.5-5 mg by mouth daily at 6 PM. Takes 2.5mg  (1 tablet) daily except 5mg  (2 tablets) on Monday and Friday 03/29/12  Yes Leodis Sias, MD   BP 129/73  Pulse 76  Temp(Src) 98.6 F (37 C) (Oral)  Resp 17  SpO2 97% Physical Exam  Nursing note and vitals reviewed. Constitutional: He is oriented to person, place, and time. He appears well-developed and well-nourished. No distress.  HENT:  Head: Normocephalic and atraumatic.  Mouth/Throat: Oropharynx is clear and moist. No oropharyngeal exudate.  Eyes: EOM are normal. Pupils are equal, round, and reactive to light.  Neck: Normal range of motion. Neck supple.  Cardiovascular: Normal rate and regular rhythm.  Exam reveals no friction rub.   No murmur heard. Pulmonary/Chest: Effort normal and breath sounds normal. No respiratory distress. He has no wheezes. He has no rales.  Abdominal: He exhibits no distension. There is no tenderness. There is no rebound.  Musculoskeletal: Normal range of motion. He exhibits no edema.  Neurological: He is alert and oriented to person, place, and time.  Skin: He is not diaphoretic.    ED Course   Procedures (including critical care time) Labs Review Labs Reviewed  CBC - Abnormal; Notable for the following:    RBC 3.83 (*)    MCV 104.7 (*)    MCH 35.2 (*)    Platelets 128 (*)    All other components within normal limits  PROTIME-INR - Abnormal; Notable for the following:    Prothrombin Time 28.2 (*)    INR 2.64 (*)    All other components within normal limits  COMPREHENSIVE METABOLIC PANEL - Abnormal; Notable for the following:    GFR calc non Af Amer 60 (*)    GFR calc Af Amer 69 (*)    All other components within normal limits  PRO B NATRIURETIC PEPTIDE - Abnormal; Notable for the following:  Pro B Natriuretic peptide (BNP) 3135.0 (*)    All other components within normal limits  I-STAT TROPOININ, ED    Imaging Review Dg Chest 2 View  03/06/2014   CLINICAL DATA:  Dizziness and weakness.  EXAM: CHEST  2 VIEW  COMPARISON:  Chest x-ray 03/18/2012.  FINDINGS: Mediastinum and hilar structures normal. Severe cardiomegaly. Mild pulmonary vascular prominence. No focal pulmonary infiltrate. No pleural effusion or pneumothorax. No acute bony abnormality.  IMPRESSION: 1. Severe cardiomegaly with mild pulmonary vascular prominence. 2. No focal pulmonary infiltrate.   Electronically Signed   By: Maisie Fushomas  Register   On: 03/06/2014 12:40     EKG Interpretation   Date/Time:  Tuesday March 06 2014 11:18:51 EDT Ventricular Rate:  75 PR Interval:    QRS Duration: 118 QT Interval:  398 QTC Calculation: 444 R Axis:   -74 Text Interpretation:  Atrial fibrillation LAD, consider left anterior  fascicular block Probable anteroseptal infarct, old T wave inversion from  prior resolved Confirmed by [facility]  MD, Torrion Witter ([id number]) on 03/06/2014 11:27:39  AM      MDM   Final diagnoses:  Acute combined systolic and diastolic congestive heart failure  Dizziness    80 year old male with history of severe aortic stenosis that is nonoperable presents with dizziness. General he is walking his  normal 800 feet in the morning. Lasted about 2-3 hours. No chest pain, and shortness of breath, nausea, vomiting. It is resolved at this time. Here vitals are stable. Lungs are clear. Neurologically he is intact. Labs show elevated BNP, 3135. Patient's not had a BNP Desai in our system. Admitted by internal medicine for diuresis.    Dagmar HaitWilliam Nyaire Denbleyker, MD 03/06/14 [phone]

## 2014-03-06 NOTE — H&P (Signed)
Date: 03/06/2014               Patient Name:  [name] Graham MRN: [id number]  DOB: 13-Jan-1929 Age / Sex: 80 y.o., male   PCP: Thelma Comp, MD              Medical Service: Internal Medicine Teaching Service              Attending Physician: Dr. Judyann Munson, MD    First Contact: Lillia Carmel, MS4 Pager: [phone]  Second Contact: Dr. Garald Braver Pager: [phone]  Third Contact Dr.  Dineen Kid: [phone]       After Hours (After 5p/  First Contact Pager: [phone]  weekends / holidays): Second Contact Pager: [phone]   Chief Complaint: Dizziness   History of Present Illness:  [name] is an 80 y.o. male with a history of severe aortic stenosis, chronic diastolic heart failure, atrial fibrillation, Parkinson's disease, DM II, HTN, HLD, hypothyroidism, and IVC filter due to hx of PE on warfarin who presented to the ED for dizziness. Patient states he walked about 150 feet this morning, started to feel dizzy, and returned home. He usually walks 600-800 feet daily. He reported having blurry vision during this episode and felt unsteady and lightheaded. This episode lasted for about 2 hours. He usually drinks 3 cups of fluids daily, and he states his cardiologist recommended he drink 3 more cups a day at his visit last week. Yesterday, he increased his fluid intake by about 2 cups. He does not weigh himself daily at home, but last week he weigh 287 Ib at his cardiologist's office and he weighs 280 Ib today. He further reports orthopnea, which he notices every morning and has had for years. He visited the neurologist last week for worsening peripheral neuropathy and was started on gabapentin. He denies chest pain, palpitations, PND, and cough. Denies recent fevers, chills, and changes in bowel movements.   In the ED, he received furosemide 40mg  IV. BNP was 3125, which is high for him as his baseline is around 2000. Troponin x1 came back negative.  Meds: Current Facility-Administered Medications    Medication Dose Route Frequency Provider Last Rate Last Dose  . [START ON 03/07/2014] allopurinol (ZYLOPRIM) tablet 300 mg  300 mg Oral Daily Ky Barban, MD      . Melene Muller ON 03/07/2014] aspirin chewable tablet 81 mg  81 mg Oral Daily Ky Barban, MD      . carbidopa-levodopa (SINEMET CR) 50-200 MG per tablet controlled release 1 tablet  1 tablet Oral TID Ky Barban, MD      . fluticasone (FLONASE) 50 MCG/ACT nasal spray 1 spray  1 spray Each Nare Daily PRN Ky Barban, MD      . Melene Muller ON 03/07/2014] furosemide (LASIX) tablet 80 mg  80 mg Oral Daily Ky Barban, MD      . gabapentin (NEURONTIN) capsule 100 mg  100 mg Oral TID Ky Barban, MD      . levothyroxine (SYNTHROID, LEVOTHROID) tablet 175 mcg  175 mcg Oral Daily Ky Barban, MD      . LORazepam (ATIVAN) tablet 1 mg  1 mg Oral BID PRN Ky Barban, MD      . Melene Muller ON 03/07/2014] metoprolol succinate (TOPROL-XL) 24 hr tablet 50 mg  50 mg Oral Daily Ky Barban, MD      . pantoprazole (PROTONIX) EC tablet 40 mg  40 mg Oral Daily Wyn Quaker D  Garald Braver, MD      . polyethylene glycol (MIRALAX / GLYCOLAX) packet 17 g  17 g Oral QPM Ky Barban, MD      . senna (SENOKOT) tablet 25.8 mg  3 tablet Oral QHS Judyann Munson, MD      . simvastatin (ZOCOR) tablet 10 mg  10 mg Oral q1800 Ky Barban, MD      . sodium chloride 0.9 % injection 3 mL  3 mL Intravenous Q12H Ky Barban, MD      . sucralfate (CARAFATE) tablet 1 g  1 g Oral BID Ky Barban, MD      . tamsulosin (FLOMAX) capsule 0.4 mg  0.4 mg Oral QPC supper Ky Barban, MD        Allergies: Allergies as of 03/06/2014 - Review Complete 03/06/2014  Allergen Reaction Noted  . Morphine and related Other (See Comments) 01/22/2012  . Nifedipine Other (See Comments) 03/07/2009   Past Medical History  Diagnosis Date  . Pulmonary embolus     bilateral  . Hypothyroid   . Hypertension   .  Chronic atrial fibrillation     INR therpeutic on Coumadin  . Dementia     mild, with hallucinations status post psychiatric evaluation during admission  . Kidney cyst, acquired   . A-fib   . CAD (coronary artery disease)     non-obstructive by cath 2006, no stents, ejection fraction  . Leukocytosis   . Tremor   . Parkinson disease   . Severe aortic stenosis   . HYPERCHOLESTEROLEMIA  IIA 12/20/2008    Qualifier: Diagnosis of  By: [name], MD, Johny Sax   . Peripheral neuropathy   . Weakness of both legs 03/18/2012  . Macrocytic anemia 03/19/2012  . Urinary incontinence 03/28/2012  . Parkinson's disease dementia 01/20/2013  . Limited mobility   . Morbid obesity with BMI of 40.0-44.9, adult   . Chronic diastolic congestive heart failure    Past Surgical History  Procedure Laterality Date  . Mole removal     Family History  Problem Relation Age of Onset  . Heart failure Mother   . Microcephaly Father    History   Social History  . Marital Status: Widow    Spouse Name: N/A    Number of Children: 6  . Years of Education: 12   Occupational History  . Retired    Social History Main Topics  . Smoking status: Former Smoker -- 2.00 packs/day for 11 years    Types: Cigarettes  . Smokeless tobacco: Never Used     Comment: Quit when he was 18 by report.  . Alcohol Use: No  . Drug Use: No  . Sexual Activity: No   Other Topics Concern  . Not on file   Social History Narrative   PCP is Renae Fickle, Cardiologist is Dr. Excell Seltzer. He receives Medicare.    Power of [name] is his son: [name] Graham - [phone].    Caretaker is [name]: [phone].      Originally from [place], completed high school. 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    He then lived with wife in [place] after her heart valve replacement.    Wife passed away on 2013/09/02. He now lives in [place], [place] again.   He had a home health nurse  caring for him. His daughter had been living with he and his wife since his wife had  a valve replacement in 6/13.      Has not smoked in 40 years.     Review of Systems: R temporal headache positive, negative for recent fevers, chills, chest pain, palpitations, shortness of breath, diarrhea, and constipation. Denies dysuria, polyuria.  Physical Exam: Blood pressure 158/81, pulse 75, temperature 98.6 F (37 C), temperature source Oral, resp. rate 16, height 6' (1.829 m), weight 126.871 kg (279 lb 11.2 oz), SpO2 99.00%.  Constitutional: Oriented to person, place, and time. Lying in bed in no acute distress.  HEENT:  Head: Normocephalic, atraumatic, non-tender to palpation Eyes: Conjunctivae are normal. Pupils are equal, round, and reactive to light.  Cardiovascular: Irregular rate, 2/6 harsh systolic murmur. No friction rub.   Pulmonary/Chest:  Clear to auscultation bilaterally. No respiratory distress. No wheezes or rales.  Abdominal: Soft. Bowel sounds present. No abdominal tenderness. Neurological: Alert and oriented to person, place, and time. CNII-XII grossly intact. Resting hand tremor.  Skin: Skin is warm and dry. Not diaphoretic.  Extremities: 5/5 upper and lower extremity strength, trace bilateral edema  Lab results:  Basic Metabolic Panel:  Recent Labs  16/10/96 1137  NA 141  K 3.9  CL 102  CO2 28  GLUCOSE 98  BUN 18  CREATININE 1.11  CALCIUM 8.8   Liver Function Tests:  Recent Labs  03/06/14 1137  AST 23  ALT 7  ALKPHOS 69  BILITOT 0.6  PROT 6.5  ALBUMIN 3.5    CBC:  Recent Labs  03/06/14 1137  WBC 4.2  HGB 13.5  HCT 40.1  MCV 104.7*  PLT 128*   Cardiac Enzymes: No results found for this basename: CKTOTAL, CKMB, CKMBINDEX, TROPONINI,  in the last 72 hours BNP:  Recent Labs  03/06/14 1137  PROBNP 3135.0*   Troponin: 0.01  Coagulation:  Recent Labs  03/06/14 1137  LABPROT 28.2*  INR 2.64*   Misc. Labs:   Imaging results:  Dg  Chest 2 View  03/06/2014   CLINICAL DATA:  Dizziness and weakness.  EXAM: CHEST  2 VIEW  COMPARISON:  Chest x-ray 03/18/2012.  FINDINGS: Mediastinum and hilar structures normal. Severe cardiomegaly. Mild pulmonary vascular prominence. No focal pulmonary infiltrate. No pleural effusion or pneumothorax. No acute bony abnormality.  IMPRESSION: 1. Severe cardiomegaly with mild pulmonary vascular prominence. 2. No focal pulmonary infiltrate.   Electronically Signed   By: Maisie Fus  Register   On: 03/06/2014 12:40    Other results: EKG:  Atrial fibrillation LAD, consider left anterior fascicular block Probable anteroseptal infarct, old T wave inversion from prior resolved   Assessment & Plan by Problem: Principal Problem:   Acute on chronic diastolic heart failure Active Problems:   HYPERCHOLESTEROLEMIA  IIA   HYPERTENSION, BENIGN   Aortic stenosis, severe   Atrial fibrillation   Parkinson's disease dementia  [name] Graham is an 80 yo male with multiple chronic diseases, including severe aortic stenosis, atrial fibrillation, and HTN, who presented to the ED for likely acute on chronic diastolic heart failure due to volume overload.  Cardiomegaly (due to severe aortic stenosis): Volume Overloaded- fluid status has improved since ED presentation, will continue to monitor and diurese as needed Furosemide 80 mg PO, daily BMP, CBC  HTN: monitor, 151/81 on admission Metoprolol succinate 50 mg PO, daily  CAD: Aspirin 81 mg PO daily Simvastatin 10mg  PO daily F/u troponin, 1st troponin negative  Chronic Atrial Fibrillation: Therapeutic INR is 2.64  Warfarin 2.5 mg on Sun/T/W/Th/Sat, 5mg  on M/F  Macrocytic Anemia: MCV 104.7 -f/u folate,  Vitamin B12 -takes Vit B12  Parkinson's Disease: well-managed, continue home meds -carbidopa-levodopa 50-200 TID  Arthritis: Acetaminophen 650mg  PO, PRN pain  Anxiety: Lorazepam 1mg  PO BID prn  BPH: tamsulosin 0.4 mg  cap  FEN/GI: Sucralfate pantoprazole Miralax Senna  This is a Psychologist, occupationalMedical Student Note.  The care of the patient was discussed with Dr. Garald BraverKennerly and the assessment and plan was formulated with their assistance.  Please see their note for official documentation of the patient encounter.   Signed: Lillia CarmelNida Hanh Kertesz, Med Student 03/06/2014, 3:08 PM

## 2014-03-06 NOTE — ED Notes (Signed)
Dr. Walden at bedside 

## 2014-03-07 DIAGNOSIS — G20 Parkinson's disease: Secondary | ICD-10-CM

## 2014-03-07 DIAGNOSIS — I10 Essential (primary) hypertension: Secondary | ICD-10-CM

## 2014-03-07 DIAGNOSIS — D7589 Other specified diseases of blood and blood-forming organs: Secondary | ICD-10-CM

## 2014-03-07 DIAGNOSIS — M1710 Unilateral primary osteoarthritis, unspecified knee: Secondary | ICD-10-CM

## 2014-03-07 DIAGNOSIS — E039 Hypothyroidism, unspecified: Secondary | ICD-10-CM

## 2014-03-07 DIAGNOSIS — I2510 Atherosclerotic heart disease of native coronary artery without angina pectoris: Secondary | ICD-10-CM

## 2014-03-07 DIAGNOSIS — R42 Dizziness and giddiness: Secondary | ICD-10-CM | POA: Diagnosis present

## 2014-03-07 DIAGNOSIS — I4891 Unspecified atrial fibrillation: Secondary | ICD-10-CM

## 2014-03-07 DIAGNOSIS — IMO0002 Reserved for concepts with insufficient information to code with codable children: Secondary | ICD-10-CM

## 2014-03-07 DIAGNOSIS — N400 Enlarged prostate without lower urinary tract symptoms: Secondary | ICD-10-CM

## 2014-03-07 DIAGNOSIS — I509 Heart failure, unspecified: Secondary | ICD-10-CM

## 2014-03-07 DIAGNOSIS — I5033 Acute on chronic diastolic (congestive) heart failure: Principal | ICD-10-CM

## 2014-03-07 DIAGNOSIS — F411 Generalized anxiety disorder: Secondary | ICD-10-CM

## 2014-03-07 DIAGNOSIS — G20A1 Parkinson's disease without dyskinesia, without mention of fluctuations: Secondary | ICD-10-CM

## 2014-03-07 LAB — CBC
HCT: 40.3 % (ref 39.0–52.0)
Hemoglobin: 13.3 g/dL (ref 13.0–17.0)
MCH: 33.8 pg (ref 26.0–34.0)
MCHC: 33 g/dL (ref 30.0–36.0)
MCV: 102.3 fL — AB (ref 78.0–100.0)
Platelets: 136 10*3/uL — ABNORMAL LOW (ref 150–400)
RBC: 3.94 MIL/uL — AB (ref 4.22–5.81)
RDW: 14.7 % (ref 11.5–15.5)
WBC: 5.2 10*3/uL (ref 4.0–10.5)

## 2014-03-07 LAB — FOLATE RBC: RBC Folate: >1000 ng/mL — ABNORMAL HIGH (ref >280)

## 2014-03-07 LAB — BASIC METABOLIC PANEL
Anion gap: 13 (ref 5–15)
BUN: 20 mg/dL (ref 6–23)
CALCIUM: 8.5 mg/dL (ref 8.4–10.5)
CO2: 27 mEq/L (ref 19–32)
Chloride: 100 mEq/L (ref 96–112)
Creatinine, Ser: 0.94 mg/dL (ref 0.50–1.35)
GFR calc Af Amer: 88 mL/min — ABNORMAL LOW (ref >90)
GFR calc non Af Amer: 76 mL/min — ABNORMAL LOW (ref >90)
GLUCOSE: 100 mg/dL — AB (ref 70–99)
Potassium: 3.5 mEq/L — ABNORMAL LOW (ref 3.7–5.3)
SODIUM: 140 meq/L (ref 137–147)

## 2014-03-07 LAB — PROTIME-INR
INR: 2.76 — ABNORMAL HIGH (ref 0.00–1.49)
Prothrombin Time: 29.2 seconds — ABNORMAL HIGH (ref 11.6–15.2)

## 2014-03-07 LAB — TROPONIN I: Troponin I: <0.3 ng/mL (ref <0.30)

## 2014-03-07 MED ORDER — POTASSIUM CHLORIDE CRYS ER 20 MEQ PO TBCR
40.0000 meq | EXTENDED_RELEASE_TABLET | Freq: Once | ORAL | Status: AC
  Administered 2014-03-07: 40 meq via ORAL

## 2014-03-07 NOTE — Progress Notes (Addendum)
I have seen the patient and reviewed the daily progress note by Lillia CarmelNida Waheed MS 4 and discussed the care of the patient with them.  See below for documentation of my findings, assessment, and plans.  Subjective: States that his dizziness is improving, almost gone. He had one episode of left lower leg numbness but had been sitting in chair for a while with no Neurologic changes on exam. He worked with PT today with no gait abnormality.   Objective: Vital signs in last 24 hours: Filed Vitals:   03/06/14 1430 03/06/14 2109 03/07/14 0602 03/07/14 1334  BP: 158/81 116/60 112/64 104/69  Pulse: 75 82 85 78  Temp: 98.6 F (37 C) 98.1 F (36.7 C) 98.2 F (36.8 C) 98.7 F (37.1 C)  TempSrc: Oral Oral Oral Oral  Resp: 16 18 20 20   Height:      Weight:   277 lb 5.4 oz (125.8 kg)   SpO2: 99% 97% 95% 94%   Weight change:   Intake/Output Summary (Last 24 hours) at 03/07/14 1537 Last data filed at 03/07/14 1333  Gross per 24 hour  Intake    960 ml  Output   2501 ml  Net  -1541 ml   Vitals reviewed. General: Sitting in chair, in NAD HEENT: PERRL, EOMI, no scleral icterus Cardiac: Irregularly irregular, no rubs, harsh systolic murmur, no gallop Pulm: clear to auscultation bilaterally, no wheezes, rales, or rhonchi Abd: soft, nontender, nondistended, BS present Ext: warm and well perfused, trace pitting edema Neuro: alert and oriented X3, speech fluent with no aphasia, cranial nerves II-XII intact, no tongue fasciculations, visual fields normal bilaterally, follows 3-step commands, strength and sensation to light touch equal in bilateral upper and lower extremities. Reflexes 2+ for brachioradialis, patellar and 1+ for Achilles bilaterally. Finger-to-nose test and heel-to-shin test normal. Has bilateral tremor at rest (chronic). No pronator drift.   Lab Results: Reviewed and documented in Electronic Record Micro Results: Reviewed and documented in Electronic  Record Studies/Results: Reviewed and documented in Electronic Record Medications: I have reviewed the patient's current medications. Scheduled Meds: . allopurinol  300 mg Oral Daily  . aspirin  81 mg Oral Daily  . carbidopa-levodopa  1 tablet Oral TID  . furosemide  80 mg Oral Daily  . levothyroxine  175 mcg Oral QAC breakfast  . metoprolol succinate  50 mg Oral Daily  . pantoprazole  40 mg Oral Daily  . polyethylene glycol  17 g Oral QPM  . simvastatin  10 mg Oral q1800  . sodium chloride  3 mL Intravenous Q12H  . sucralfate  1 g Oral BID  . tamsulosin  0.4 mg Oral QPC supper  . warfarin  5 mg Oral Once per day on Sun Tue Wed Thu Sat   And  . [START ON 03/09/2014] warfarin  2.5 mg Oral Once per day on Mon Fri  . Warfarin - Pharmacist Dosing Inpatient   Does not apply q1800   Continuous Infusions:  PRN Meds:.acetaminophen, fluticasone, LORazepam Assessment/Plan: 80 yo male with multiple chronic diseases, including severe aortic stenosis, atrial fibrillation, and HTN, who presented to the ED for likely acute on chronic diastolic heart failure due to volume overload who is now euvolemic with resolved dizziness.   Dizziness: Improving. Etiology unclear. Could have been due to medications (gabapentin, lorazepam, and flomax), or postural hypotension/autonomic dysfunction due to Parkinson's disease. He follows up with outpatient Neurology (Dr. Everlena CooperJaffe) with recommendations for NCV-EMG of the left upper and lower extremities if left sided  numbness reocurred. Outpatient testing for B12, TSH, ANA, Sed Rate, SPEP/IFE have been unremarkable (ANA positive but titer only 1:40. Cardiac etiology, including arrhythmia and ACS considered but MI has been ruled out with troponin x3 negative, no EKG changes, no arrhythmias on telemetry. He reports that his dizziness has improved overnight after we discontinued his gabapentin. He had one episode of left leg numbness with no Neurologic changes, most consistent with  transient/positional peripheral neuropathy.  -Physical Therapy assessed patient: reported that pt did not experience dizziness or numbness. Pt ambulating appropriately and no acute or f/u PT needs.  -Continue warfarin per Pharmacy -Continue telemetry  Numbness: Of left LE. Likely intermittent/positional peripheral neuropathy. He has seen Neurology as outpatient for this problem with normal tests per above and may order NCV-EMG as outpatient. He denies claudication but has diminished DP and TP pulses bilaterally and no hair in bilateral feet with chronic venous statis changes. -Ordered ABIs  Acute on Chronic diastolic heart failure: Resolved. He was mildly overloaded in the ED but has diuresed over 3L since admission. He is on Lasix 80mg  daily at home and reports compliance with this medication. He reports a high sodium diet at home (ham biscuit every morning). Does not take daily weights at home. On physical exam he appears euvolemic.  -Strict I&Os  -Continue home Lasix 80mg  daily  -Will need Heart Failure RN education (inpatient and/or outpatient)  HTN: Mildly elevated on admission at 151/81, but this has resolved with current BP 104/64  -Continue home Metoprolol XL 50 mg PO, daily   CAD: Continue home Aspirin 81 mg PO daily, Simvastatin 10mg  PO daily.   Chronic Atrial Fibrillation: His INR is therapeutic INR is 2.64.  -warfarin per pharmacy  -metoprolol XL 50mg  daily  Hypothyroidism: Last TSH of 5.528, slightly elevated on 7/29. Continue home synthroid at daily.   Macrocytosis: MCV 102.3 but normal Hg 13.3.  -Vit B12 mildly elevated at 914, folate level elevated at >1000  -Patient reports taking Vit B12 at home   Parkinson's Disease: with mild dementia but A&O x3. Likely contributing to his dizziness/numbness. Continue home meds.  -carbidopa-levodopa 50-200 TID   Osteoarthritis of knees: Continue home Acetaminophen 650mg  PO, PRN pain   Anxiety: Continue home Lorazepam 1mg  PO  BID prn   BPH: Continue tamsulosin 0.4 mg cap   DVT prophylaxis: on warfarin, therapeutic   FEN/GI:  Low sodium diet  pantoprazole  Miralax    Dispo: Disposition is deferred at this time, awaiting improvement of current medical problems.  Anticipated discharge in approximately 1-2 day(s).   The patient does have a current PCP (Thelma Comp, MD) and does not need an [facility] hospital follow-up appointment after discharge.  The patient does not have transportation limitations that hinder transportation to clinic appointments.  .Services Needed at time of discharge: Y = Yes, Blank = No PT:   OT:   RN:   Equipment:   Other:     LOS: 1 day   Ky Barban, MD 03/07/2014, 3:37 PM

## 2014-03-07 NOTE — Progress Notes (Signed)
UR completed 

## 2014-03-07 NOTE — Evaluation (Signed)
Physical Therapy Evaluation Patient Details Name: [name] Graham MRN: [id number] DOB: 08/27/1928 Today's Date: 03/07/2014   History of Present Illness  [name] Graham is an 80 y.o. male with a history of severe aortic stenosis, chronic diastolic heart failure, atrial fibrillation, Parkinson's disease, DM II, HTN, HLD, hypothyroidism, and IVC filter due to hx of PE on warfarin who presented to the ED for dizziness. Patient states he walked about 150 feet this morning, started to feel dizzy, and returned home. He usually walks 600-800 feet daily. He reported having blurry vision during this episode and felt unsteady and lightheaded. This episode lasted for about 2 hours. He usually drinks 3 cups of fluids daily, and he states his cardiologist recommended he drink 3 more cups a day at his visit last week. Yesterday, he increased his fluid intake by about 2 cups. He does not weigh himself daily at home, but last week he weigh 287 Ib at his cardiologist's office and he weighs 280 Ib today. He further reports orthopnea, which he notices every morning and has had for years. He visited the neurologist last week for worsening peripheral neuropathy and was started on gabapentin. Pt came in with volume overload.    Clinical Impression  Pt not experiencing problems that brought him in, dizziness, numbness, at this time. Ambulated 400' with SPC, mod I level. Encouraged to keep up his activity level while he is here. Currently no acute or f/u PT needs. PT signing off.    Follow Up Recommendations No PT follow up    Equipment Recommendations  None recommended by PT    Recommendations for Other Services       Precautions / Restrictions Precautions Precautions: None Restrictions Weight Bearing Restrictions: No      Mobility  Bed Mobility Overal bed mobility: Modified Independent                Transfers Overall transfer level: Modified independent Equipment used: None                 Ambulation/Gait Ambulation/Gait assistance: Modified independent (Device/Increase time) Ambulation Distance (Feet): 400 Feet Assistive device: Straight cane Gait Pattern/deviations: Step-through pattern;Wide base of support Gait velocity: WFL   General Gait Details: pt ambulates with SPC and surprising quickness given his Parkinsons. He demonstrates a widened stance but no fwd trunk flexion or stuttering steps. No LOB or DOE. VSS  Stairs            Wheelchair Mobility    Modified Rankin (Stroke Patients Only)       Balance Overall balance assessment: Needs assistance Sitting-balance support: No upper extremity supported;Feet supported Sitting balance-Leahy Scale: Normal     Standing balance support: No upper extremity supported;During functional activity Standing balance-Leahy Scale: Good Standing balance comment: pt with underlying mild balance impairment with Parkinsons and peripheral neuropathy but no dizziness on eval, no LOB, no recent falls. Cane adequate for safety with ambulation at this point.                             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     Hand Dominance        Extremity/Trunk Assessment   Upper Extremity Assessment: Overall WFL for tasks assessed           Lower Extremity Assessment: Overall WFL for tasks assessed      Cervical / Trunk Assessment: Normal  Communication   Communication: No difficulties  Cognition Arousal/Alertness:  Awake/alert Behavior During Therapy: WFL for tasks assessed/performed Overall Cognitive Status: History of cognitive impairments - at baseline Area of Impairment: Memory     Memory: Decreased short-term memory         General Comments: pt repeated self 2-3 x throughout session and somewhat internally distracted by the fear that the dizziness that he experienced will get inside his brain and he "won't even remember who the president is".  Also very concerned about numbness that he experienced in his tongue 2 days before admission.    General Comments General comments (skin integrity, edema, etc.): pt currently with no dizziness and no numbness tongue or face    Exercises Other Exercises Other Exercises: encouraged pt to perform ambulation in hall 3-5 more times today Other Exercises: pt completes LE exercises each night before bed, encouraged him to do them here as well      Assessment/Plan    PT Assessment Patent does not need any further PT services  PT Diagnosis     PT Problem List    PT Treatment Interventions     PT Goals (Current goals can be found in the Care Plan section) Acute Rehab PT Goals Patient Stated Goal: return to home and walking with no dizziness PT Goal Formulation: No goals set, d/c therapy    Frequency     Barriers to discharge        Co-evaluation               End of Session Equipment Utilized During Treatment: Gait belt Activity Tolerance: Patient tolerated treatment well Patient left: in chair;with call bell/phone within reach Nurse Communication: Mobility status    Functional Assessment Tool Used: clinical judgement Functional Limitation: Mobility: Walking and moving around Mobility: Walking and Moving Around Current Status [phone]): 0 percent impaired, limited or restricted Mobility: Walking and Moving Around Goal Status [phone]): 0 percent impaired, limited or restricted Mobility: Walking and Moving Around Discharge Status [phone]):  0 percent impaired, limited or restricted    Time: 0852-0915 PT Time Calculation (min): 23 min   Charges:   PT Evaluation $Initial PT Evaluation Tier I: 1 Procedure PT Treatments $Gait Training: 8-22 mins   PT G Codes:   Functional Assessment Tool Used: clinical judgement Functional Limitation: Mobility: Walking and moving around  [place], PT  Acute Rehab Services  [phone]   Pecola Leisure, [place] 03/07/2014, 11:06 AM

## 2014-03-07 NOTE — Progress Notes (Signed)
ANTICOAGULATION CONSULT NOTE - Follow up Consult  Pharmacy Consult for coumadin Indication: atrial fibrillation  Allergies  Allergen Reactions  . Morphine And Related Other (See Comments)    Goes Crazy  . Nifedipine Other (See Comments)    "makes me go crazy" procardia    Patient Measurements: Height: 6' (182.9 cm) Weight: 277 lb 5.4 oz (125.8 kg) IBW/kg (Calculated) : 77.6   Vital Signs: Temp: 98.2 F (36.8 C) (08/05 0602) Temp src: Oral (08/05 0602) BP: 112/64 mmHg (08/05 0602) Pulse Rate: 85 (08/05 0602)  Labs:  Recent Labs  03/06/14 1137 03/06/14 1820 03/07/14  HGB 13.5  --  13.3  HCT 40.1  --  40.3  PLT 128*  --  136*  LABPROT 28.2*  --  29.2*  INR 2.64*  --  2.76*  CREATININE 1.11  --  0.94  TROPONINI  --  <0.30 <0.30    Estimated Creatinine Clearance: 83 ml/min (by C-G formula based on Cr of 0.94).   Medical History: Past Medical History  Diagnosis Date  . Pulmonary embolus     bilateral  . Hypothyroid   . Hypertension   . Chronic atrial fibrillation     INR therpeutic on Coumadin  . Dementia     mild, with hallucinations status post psychiatric evaluation during admission  . Kidney cyst, acquired   . A-fib   . CAD (coronary artery disease)     non-obstructive by cath 2006, no stents, ejection fraction  . Leukocytosis   . Tremor   . Parkinson disease   . Severe aortic stenosis   . HYPERCHOLESTEROLEMIA  IIA 12/20/2008    Qualifier: Diagnosis of  By: [name], MD, Johny Sax   . Peripheral neuropathy   . Weakness of both legs 03/18/2012  . Macrocytic anemia 03/19/2012  . Urinary incontinence 03/28/2012  . Parkinson's disease dementia 01/20/2013  . Limited mobility   . Morbid obesity with BMI of 40.0-44.9, adult   . Chronic diastolic congestive heart failure     Medications:  Prescriptions prior to admission  Medication Sig Dispense Refill  . acetaminophen (ARTHRITIS PAIN RELIEF) 650 MG CR tablet Take 650 mg by mouth. Take 2 tabs AM  and 1 tab PM       . allopurinol (ZYLOPRIM) 300 MG tablet Take 300 mg by mouth daily.        Marland Kitchen aspirin 81 MG chewable tablet Chew 81 mg by mouth daily.      . carbidopa-levodopa (SINEMET CR) 50-200 MG per tablet Take 1 tablet by mouth 3 (three) times daily.  90 tablet  0  . fluticasone (FLONASE) 50 MCG/ACT nasal spray Place 1 spray into both nostrils daily as needed for allergies.       . furosemide (LASIX) 80 MG tablet Take 1 tablet (80 mg total) by mouth daily.  30 tablet  1  . gabapentin (NEURONTIN) 100 MG capsule Take 1 capsule (100 mg total) by mouth 3 (three) times daily.  90 capsule  0  . levothyroxine (SYNTHROID) 175 MCG tablet Take 175 mcg by mouth daily.        Marland Kitchen LORazepam (ATIVAN) 1 MG tablet Take 1 tablet (1 mg total) by mouth 2 (two) times daily as needed for anxiety.  30 tablet  0  . metoprolol (TOPROL XL) 50 MG 24 hr tablet Take 50 mg by mouth daily.        . Misc Natural Products (OSTEO BI-FLEX ADV DOUBLE ST PO) Take 2 tablets by mouth daily.       Marland Kitchen  Omega-3 Fatty Acids (FISH OIL) 1000 MG CAPS Take 1 capsule by mouth 2 (two) times daily.       . pantoprazole (PROTONIX) 40 MG tablet Take 40 mg by mouth daily.       . polyethylene glycol (MIRALAX / GLYCOLAX) packet Take 17 g by mouth every evening.      . potassium chloride SA (K-DUR,KLOR-CON) 20 MEQ tablet Take 20 mEq by mouth daily.      . pravastatin (PRAVACHOL) 20 MG tablet Take 20 mg by mouth daily.       Marland Kitchen. senna (SENOKOT) 8.6 MG tablet Take 3 tablets by mouth at bedtime.      . sucralfate (CARAFATE) 1 G tablet Take 1 g by mouth 2 (two) times daily.        . tamsulosin (FLOMAX) 0.4 MG CAPS capsule Take 0.4 mg by mouth daily after supper.       . warfarin (COUMADIN) 2.5 MG tablet Take 2.5-5 mg by mouth daily at 6 PM. Takes 2.5mg  (1 tablet) daily except 5mg  (2 tablets) on Monday and Friday        Assessment: 80 yo M admitted with CC of dizziness.  On coumadin PTA for Afib and also has hx of B PE.  Presents with "numbness in  head" and tingling on left side. MRI 7/25 shows remote lacunar infarcts. INR remains therapeutic at 2.76 today. Hgb 13.3, plts up to 136- last were 139 in July. 190s in 2014. Home dose 2.5 qday x 5 mg M,F.  Goal of Therapy:  INR 2-3 Monitor platelets by anticoagulation protocol: Yes   Plan:  Continue home dose of 2.5 mg daily x 5 mg M,F Daily INR for now  [name] Graham, PharmD.  Clinical Pharmacist Pager [phone]

## 2014-03-07 NOTE — Progress Notes (Signed)
Subjective: Patient has diuresed approximately 3 L since admission [name] appears euvolemic. Reports no dizziness while walking around the floor this morning. He states he did not have lower leg numbness this morning when he awoke, although he later reported feeling bilateral lower leg numbness to his knee. No new neurological deficits apparent.   Objective: Vital signs in last 24 hours: Filed Vitals:   03/06/14 1423 03/06/14 1430 03/06/14 2109 03/07/14 0602  BP:  158/81 116/60 112/64  Pulse:  75 82 85  Temp:  98.6 F (37 C) 98.1 F (36.7 C) 98.2 F (36.8 C)  TempSrc:  Oral Oral Oral  Resp:  16 18 20   Height: 6' (1.829 m)     Weight: 126.871 kg (279 lb 11.2 oz)   125.8 kg (277 lb 5.4 oz)  SpO2:  99% 97% 95%   Weight change:  -1.071 kg  Intake/Output Summary (Last 24 hours) at 03/07/14 1309 Last data filed at 03/07/14 0905  Gross per 24 hour  Intake    720 ml  Output   3000 ml  Net  -2280 ml   Constitutional: No distress.  HENT:  Head: Normocephalic [name] atraumatic.  Eyes: Conjunctivae are normal. Pupils are equal, round, [name] reactive to light.  Cardiovascular: Irregular rate [name] rhythm. Harsh systolic murmur.   Pulmonary/Chest: Effort normal. No respiratory distress. CTAB with no wheezes or rales. Abdominal: Soft. Bowel sounds are normal. No abd tenderness or distention. Neurological: He is alert [name] oriented to person, place, [name] time. Bilateral brachial, brachioradialis, [name] patellar reflexes.  EOMi, no visual field defects, PERRL, symmetrical palate elevation, normal facial movement, gross hearing normal. CN II-XII tested [name] all wnl. Finger to nose [name] heel to shin normal.  Ext: 5/5 upper [name] lower extremity strength, varicose veins bilateral on hairless lower extremities. Left lower extremity varicose veins more noticeable. Skin changes noticed on lower leg to foot from venous stasis.   Lab Results: Basic Metabolic Panel:  Recent Labs Lab 03/06/14 1137 03/07/14  NA 141  140  K 3.9 3.5*  CL 102 100  CO2 28 27  GLUCOSE 98 100*  BUN 18 20  CREATININE 1.11 0.94  CALCIUM 8.8 8.5   Liver Function Tests:  Recent Labs Lab 03/06/14 1137  AST 23  ALT 7  ALKPHOS 69  BILITOT 0.6  PROT 6.5  ALBUMIN 3.5   No results found for this basename: LIPASE, AMYLASE,  in the last 168 hours No results found for this basename: AMMONIA,  in the last 168 hours CBC:  Recent Labs Lab 03/06/14 1137 03/07/14  WBC 4.2 5.2  HGB 13.5 13.3  HCT 40.1 40.3  MCV 104.7* 102.3*  PLT 128* 136*   Cardiac Enzymes:  Recent Labs Lab 03/06/14 1820 03/07/14  TROPONINI <0.30 <0.30   BNP:  Recent Labs Lab 03/06/14 1137  PROBNP 3135.0*   D-Dimer: No results found for this basename: DDIMER,  in the last 168 hours CBG: No results found for this basename: GLUCAP,  in the last 168 hours Hemoglobin A1C: No results found for this basename: HGBA1C,  in the last 168 hours Fasting Lipid Panel: No results found for this basename: CHOL, HDL, LDLCALC, TRIG, CHOLHDL, LDLDIRECT,  in the last 168 hours Thyroid Function Tests:  Recent Labs Lab 02/28/14 1543  TSH 5.528*   Coagulation:  Recent Labs Lab 03/06/14 1137 03/07/14  LABPROT 28.2* 29.2*  INR 2.64* 2.76*   Anemia Panel:  Recent Labs Lab 03/06/14 1820  VITAMINB12 914*   Urine Drug  Screen: Drugs of Abuse     Component Value Date/Time   LABOPIA NONE DETECTED 02/24/2014 1636   COCAINSCRNUR NONE DETECTED 02/24/2014 1636   LABBENZ NONE DETECTED 02/24/2014 1636   AMPHETMU NONE DETECTED 02/24/2014 1636   THCU NONE DETECTED 02/24/2014 1636   LABBARB NONE DETECTED 02/24/2014 1636    Alcohol Level: No results found for this basename: ETH,  in the last 168 hours Urinalysis: No results found for this basename: COLORURINE, APPERANCEUR, LABSPEC, PHURINE, GLUCOSEU, HGBUR, BILIRUBINUR, KETONESUR, PROTEINUR, UROBILINOGEN, NITRITE, LEUKOCYTESUR,  in the last 168 hours Misc. Labs:   Micro Results: Recent Results (from the  past 240 hour(s))  MRSA PCR SCREENING     Status: None   Collection Time    03/06/14  2:46 PM      Result Value Ref Range Status   MRSA by PCR NEGATIVE  NEGATIVE Final   Comment:            The GeneXpert MRSA Assay (FDA     approved for NASAL specimens     only), is one component of a     comprehensive MRSA colonization     surveillance program. It is not     intended to diagnose MRSA     infection nor to guide or     monitor treatment for     MRSA infections.   Studies/Results: Dg Chest 2 View  03/06/2014   CLINICAL DATA:  Dizziness [name] weakness.  EXAM: CHEST  2 VIEW  COMPARISON:  Chest x-ray 03/18/2012.  FINDINGS: Mediastinum [name] hilar structures normal. Severe cardiomegaly. Mild pulmonary vascular prominence. No focal pulmonary infiltrate. No pleural effusion or pneumothorax. No acute bony abnormality.  IMPRESSION: 1. Severe cardiomegaly with mild pulmonary vascular prominence. 2. No focal pulmonary infiltrate.   Electronically Signed   By: Maisie Fushomas  Register   On: 03/06/2014 12:40   Medications: I have reviewed the patient's current medications. Scheduled Meds: . allopurinol  300 mg Oral Daily  . aspirin  81 mg Oral Daily  . carbidopa-levodopa  1 tablet Oral TID  . furosemide  80 mg Oral Daily  . levothyroxine  175 mcg Oral QAC breakfast  . metoprolol succinate  50 mg Oral Daily  . pantoprazole  40 mg Oral Daily  . polyethylene glycol  17 g Oral QPM  . simvastatin  10 mg Oral q1800  . sodium chloride  3 mL Intravenous Q12H  . sucralfate  1 g Oral BID  . tamsulosin  0.4 mg Oral QPC supper  . warfarin  5 mg Oral Once per day on Sun Tue Wed Thu Sat   [name]  . [START ON 03/09/2014] warfarin  2.5 mg Oral Once per day on Mon Fri  . Warfarin - Pharmacist Dosing Inpatient   Does not apply q1800   Continuous Infusions:  PRN Meds:.acetaminophen, fluticasone, LORazepam Assessment/Plan: Principal Problem:   Acute on chronic diastolic heart failure Active Problems:    HYPERCHOLESTEROLEMIA  IIA   HYPERTENSION, BENIGN   Aortic Graham, severe   [name] Graham   Parkinson's disease dementia   Dizziness  80 yo male with multiple chronic diseases, [name] Graham, [name] Graham, [name] Graham, [name] presented to the ED for likely acute on chronic diastolic heart failure due to volume overload [name] is now euvolemic with resolved dizziness.   Dizziness: Etiology unclear, but patient reports clinical improvement. Dizziness could have been due to volume overload status on admission. Morning dizziness could have been due to medications (gabapentin, lorazepam, [name] flomax),  or postural hypotension 2/2 Parkinson's disease. He follows up with outpatient Neurology (Dr. Everlena Cooper) with recommendations for NCV-EMG of the left upper [name] lower extremities, [name] testing for B12, TSH, ANA, Sed Rate, SPEP/IFE. Cardiology etiology, [name] arrhythmia [name] ACS is possible given his PMH of severe AS [name] A. Fib.   -Physical Therapy assessed patient: reported that pt did not experience dizziness or numbness. Pt ambulating appropriately [name] no acute or f/u PT needs.  -Troponin neg x2 -EKG shows rate controlled a fib (75) [name] resolved T wave inversions from prior -Continue warfarin -ANA positive (7/29) -SPEP showed nonspecific pattern (7/29)  Acute on Chronic diastolic heart failure: Resolved. He was mildly overloaded in the ED but has diuresed over 3L since admission. He is on Lasix 80mg  daily at home [name] reports compliance with this medication. He reports a high sodium diet at home (ham biscuit every morning). Does not take daily weights at home. On physical exam he appears euvolemic.  -Strict I&Os  -Continue home Lasix 80mg  daily   Graham: Mildly elevated on admission at 151/81, but this has resolved with current BP 104/64 -Continue home Metoprolol XL 50 mg PO, daily   CAD: Continue home Aspirin 81 mg PO daily, Simvastatin 10mg  PO daily.   Chronic [name]  Graham: His INR is therapeutic INR is 2.64.  -warfarin per pharmacy   Hypothyroidism: Last TSH of 5.528, slightly elevated on 7/29. Continue home synthroid at daily.  Macrocytosis: MCV 102.3 but normal Hg 13.3.  -Vit B12 mildly elevated at 914, folate level elevated at >1000 -Patient reports taking Vit B12 at home  Parkinson's Disease: with mild dementia but A&O x3. Continue home meds  -carbidopa-levodopa 50-200 TID   Osteoarthritis of knees: Continue home Acetaminophen 650mg  PO, PRN pain   Anxiety: Continue home Lorazepam 1mg  PO BID prn   BPH: Continue tamsulosin 0.4 mg cap   DVT prophylaxis: on warfarin, therapeutic   FEN/GI:  Low sodium diet  pantoprazole  Miralax   Dispo: Likely can be discharged tomorrow. Neurology appt made for Aug 19th  at 10:45 AM with Dr. Everlena Cooper. Will discuss with patient if he is comfortable seeing another neurologists in order to have an earlier follow-up appointment.   This is a Psychologist, occupational Note.  The care of the patient was discussed with Dr. Garald Braver [name] the assessment [name] plan formulated with their assistance.  Please see their attached note for official documentation of the daily encounter.   LOS: 1 day   Lillia Carmel, Med Student 03/07/2014, 1:09 PM

## 2014-03-07 NOTE — Progress Notes (Signed)
  Date: 03/07/2014  Patient name: Madelynn DoneFranklin D Dillenbeck  Medical record number: [id number]  Date of birth: 04/16/1929   This patient has been seen and the plan of care was discussed with Dr. Garald BraverKennerly. Please see her note for complete details. I concur with their findings and plan for evaluation of dizziness and subacute neuropathy.  Judyann Munsonynthia Raylinn Kosar, MD 03/07/2014, 10:07 PM

## 2014-03-08 LAB — CBC
HCT: 39.6 % (ref 39.0–52.0)
HEMOGLOBIN: 13.3 g/dL (ref 13.0–17.0)
MCH: 34.6 pg — ABNORMAL HIGH (ref 26.0–34.0)
MCHC: 33.6 g/dL (ref 30.0–36.0)
MCV: 103.1 fL — ABNORMAL HIGH (ref 78.0–100.0)
PLATELETS: 131 10*3/uL — AB (ref 150–400)
RBC: 3.84 MIL/uL — AB (ref 4.22–5.81)
RDW: 14.9 % (ref 11.5–15.5)
WBC: 5.6 10*3/uL (ref 4.0–10.5)

## 2014-03-08 LAB — BASIC METABOLIC PANEL
ANION GAP: 12 (ref 5–15)
BUN: 24 mg/dL — ABNORMAL HIGH (ref 6–23)
CO2: 28 mEq/L (ref 19–32)
Calcium: 8.9 mg/dL (ref 8.4–10.5)
Chloride: 99 mEq/L (ref 96–112)
Creatinine, Ser: 1.14 mg/dL (ref 0.50–1.35)
GFR calc non Af Amer: 58 mL/min — ABNORMAL LOW (ref >90)
GFR, EST AFRICAN AMERICAN: 67 mL/min — AB (ref >90)
Glucose, Bld: 99 mg/dL (ref 70–99)
POTASSIUM: 3.8 meq/L (ref 3.7–5.3)
Sodium: 139 mEq/L (ref 137–147)

## 2014-03-08 LAB — PROTIME-INR
INR: 3.13 — AB (ref 0.00–1.49)
PROTHROMBIN TIME: 32.2 s — AB (ref 11.6–15.2)

## 2014-03-08 MED ORDER — WARFARIN SODIUM 2.5 MG PO TABS
2.5000 mg | ORAL_TABLET | Freq: Once | ORAL | Status: DC
Start: 2014-03-08 — End: 2014-03-08
  Filled 2014-03-08: qty 1

## 2014-03-08 MED ORDER — WARFARIN SODIUM 2.5 MG PO TABS
2.5000 mg | ORAL_TABLET | ORAL | Status: DC
  Filled 2014-03-08 (×2): qty 1

## 2014-03-08 MED ORDER — WARFARIN SODIUM 5 MG PO TABS: 5.0000 mg | ORAL_TABLET | ORAL | Status: DC

## 2014-03-08 NOTE — Progress Notes (Signed)
Subjective: No acute events overnight. Patient felt dizzy when getting up from his bed to go to the restroom, but this resolved after 30 seconds when he sat on his bed. States he thinks the dizziness occurred because he got up too fast. He also mentions that he has numbness in his feet bilaterally. Feels steady when ambulating. Walked around the floor this morning and did not feel dizzy or SOB.   Objective: Vital signs in last 24 hours: Filed Vitals:   03/07/14 1334 03/07/14 2043 03/08/14 0600 03/08/14 1002  BP: 104/69 116/59 128/75 134/67  Pulse: 78 76 69 67  Temp: 98.7 F (37.1 C) 98 F (36.7 C) 98 F (36.7 C) 98.9 F (37.2 C)  TempSrc: Oral Oral Oral Oral  Resp: 20 20 18 18   Height:      Weight:   126.916 kg (279 lb 12.8 oz)   SpO2: 94% 94% 96% 98%   Weight change: 0.045 kg (1.6 oz)  Intake/Output Summary (Last 24 hours) at 03/08/14 1406 Last data filed at 03/08/14 1004  Gross per 24 hour  Intake    603 ml  Output    925 ml  Net   -322 ml   Physical Exam: General: Sitting in bed, in NAD  HEENT: PERRL, EOMI, no scleral icterus  Cardiac: Irregularly irregular, no rubs or gallops, harsh systolic murmur Pulm: Normal effort. Clear to auscultation bilaterally, no wheezes, rales, or rhonchi  Abd: soft, nontender, nondistended, BS present  Ext: warm and well perfused, no pitting edema, varicose veins  Neuro: alert and oriented X 3, fluent speech, cranial nerves II-XII intact  Lab Results: Basic Metabolic Panel:  Recent Labs Lab 03/07/14 03/08/14 0945  NA 140 139  K 3.5* 3.8  CL 100 99  CO2 27 28  GLUCOSE 100* 99  BUN 20 24*  CREATININE 0.94 1.14  CALCIUM 8.5 8.9   Liver Function Tests:  Recent Labs Lab 03/06/14 1137  AST 23  ALT 7  ALKPHOS 69  BILITOT 0.6  PROT 6.5  ALBUMIN 3.5   No results found for this basename: LIPASE, AMYLASE,  in the last 168 hours No results found for this basename: AMMONIA,  in the last 168 hours CBC:  Recent Labs Lab  03/07/14 03/08/14 0945  WBC 5.2 5.6  HGB 13.3 13.3  HCT 40.3 39.6  MCV 102.3* 103.1*  PLT 136* 131*   Cardiac Enzymes:  Recent Labs Lab 03/06/14 1820 03/07/14  TROPONINI <0.30 <0.30   BNP:  Recent Labs Lab 03/06/14 1137  PROBNP 3135.0*   D-Dimer: No results found for this basename: DDIMER,  in the last 168 hours CBG: No results found for this basename: GLUCAP,  in the last 168 hours Hemoglobin A1C: No results found for this basename: HGBA1C,  in the last 168 hours Fasting Lipid Panel: No results found for this basename: CHOL, HDL, LDLCALC, TRIG, CHOLHDL, LDLDIRECT,  in the last 168 hours Thyroid Function Tests: No results found for this basename: TSH, T4TOTAL, FREET4, T3FREE, THYROIDAB,  in the last 168 hours Coagulation:  Recent Labs Lab 03/06/14 1137 03/07/14 03/08/14 0425  LABPROT 28.2* 29.2* 32.2*  INR 2.64* 2.76* 3.13*   Anemia Panel:  Recent Labs Lab 03/06/14 1820  VITAMINB12 914*   Urine Drug Screen: Drugs of Abuse     Component Value Date/Time   LABOPIA NONE DETECTED 02/24/2014 1636   COCAINSCRNUR NONE DETECTED 02/24/2014 1636   LABBENZ NONE DETECTED 02/24/2014 1636   AMPHETMU NONE DETECTED 02/24/2014 1636   THCU  NONE DETECTED 02/24/2014 1636   LABBARB NONE DETECTED 02/24/2014 1636    Alcohol Level: No results found for this basename: ETH,  in the last 168 hours Urinalysis: No results found for this basename: COLORURINE, APPERANCEUR, LABSPEC, PHURINE, GLUCOSEU, HGBUR, BILIRUBINUR, KETONESUR, PROTEINUR, UROBILINOGEN, NITRITE, LEUKOCYTESUR,  in the last 168 hours Misc. Labs:   Micro Results: Recent Results (from the past 240 hour(s))  MRSA PCR SCREENING     Status: None   Collection Time    03/06/14  2:46 PM      Result Value Ref Range Status   MRSA by PCR NEGATIVE  NEGATIVE Final   Comment:            The GeneXpert MRSA Assay (FDA     approved for NASAL specimens     only), is one component of a     comprehensive MRSA colonization      surveillance program. It is not     intended to diagnose MRSA     infection nor to guide or     monitor treatment for     MRSA infections.   Studies/Results: No results found. Medications: I have reviewed the patient's current medications. Scheduled Meds: . allopurinol  300 mg Oral Daily  . aspirin  81 mg Oral Daily  . carbidopa-levodopa  1 tablet Oral TID  . furosemide  80 mg Oral Daily  . levothyroxine  175 mcg Oral QAC breakfast  . metoprolol succinate  50 mg Oral Daily  . pantoprazole  40 mg Oral Daily  . polyethylene glycol  17 g Oral QPM  . simvastatin  10 mg Oral q1800  . sodium chloride  3 mL Intravenous Q12H  . sucralfate  1 g Oral BID  . tamsulosin  0.4 mg Oral QPC supper  . [START ON 03/12/2014] warfarin  5 mg Oral Once per day on Mon Fri   And  . warfarin  2.5 mg Oral Once per day on Sun Tue Wed Thu Sat  . Warfarin - Pharmacist Dosing Inpatient   Does not apply q1800   Continuous Infusions:  PRN Meds:.acetaminophen, fluticasone, LORazepam Assessment/Plan: Principal Problem:   Acute on chronic diastolic heart failure Active Problems:   HYPERCHOLESTEROLEMIA  IIA   HYPERTENSION, BENIGN   Aortic stenosis, severe   Atrial fibrillation   Parkinson's disease dementia   Dizziness  A: [name] Graham is an 80 yo male with multiple chronic diseases, including severe aortic stenosis, atrial fibrillation, and HTN, who presented to the ED for likely acute on chronic diastolic heart failure due to volume overload who is now euvolemic with resolved dizziness.   Dizziness: Resolved. Etiology unclear. Could have been due to medications (gabapentin, lorazepam, and flomax), or postural hypotension/autonomic dysfunction due to Parkinson's disease. He follows up with outpatient Neurology (Dr. Everlena Cooper) with recommendations for NCV-EMG of the left upper and lower extremities if left sided numbness reoccurred. Outpatient testing for B12, TSH, ANA, Sed Rate, SPEP/IFE have been unremarkable (ANA  positive but titer only 1:40. Cardiac etiology, including arrhythmia and ACS, was considered but MI has been ruled out with troponin x3 negative, no EKG changes, and no arrhythmias on telemetry. He reports that his dizziness has improved since discontinuing his gabapentin. He had one episode of left leg numbness on 8/5 with no neurologic changes, most consistent with transient/positional peripheral neuropathy. Physical Therapy assessed the patient and reported that pt did not experience dizziness or numbness while ambulating. Pt ambulating appropriately and no acute or f/u PT needs.  -  Continue home warfarin and check INR on Monday (8/10) as outpatient  Numbness: Predominantly of lower extremities. Likely intermittent/positional peripheral neuropathy. He has seen Neurology as outpatient for this problem with normal tests (see above) and will order NCV-EMG as outpatient. He denies claudication but has diminished DP and TP pulses bilaterally and no hair in bilateral feet with chronic venous statis changes.  -recommend outpatient ABIs  Acute on Chronic diastolic heart failure: Resolved. He was mildly overloaded in the ED but has diuresed over 3L since admission. He is on Lasix 80mg  daily at home and reports compliance with this medication. He reports a high sodium diet at home (ham biscuit every morning). Does not take daily weights at home. On physical exam he appears euvolemic. Heart failure RN was unable to provide HF education to patient today. Will arrange for patient to have home health visit in order to educate patient and optimize patient's ability to check fluid status.   -Continue home Lasix 80mg  daily  -Will provide Heart Failure education (outpatient)   HTN: Mildly elevated on admission at 151/81, but this has resolved with current BP 128/75  -Continue home Metoprolol XL 50 mg PO, daily   CAD: Continue home Aspirin 81 mg PO daily, Simvastatin 10mg  PO daily.   Chronic Atrial Fibrillation: His  INR is therapeutic. 3.13 today.   -warfarin per pharmacy  -metoprolol XL 50mg  daily   Hypothyroidism: Last TSH of 5.528, slightly elevated on 7/29. Continue home synthroid at 175mcg daily.   Macrocytosis: MCV 102.3 but normal Hg 13.3.  -Vit B12 mildly elevated at 914, folate level elevated at >1000  -Patient reports taking Vit B12 at home   Parkinson's Disease: with mild dementia but A&O x3. Likely contributing to his dizziness/numbness. Continue home meds.  -carbidopa-levodopa 50-200 TID   Osteoarthritis of knees: Continue home Acetaminophen 650mg  PO, PRN pain   Anxiety: Continue home Lorazepam 1mg  PO BID prn   BPH: Continue tamsulosin 0.4 mg cap   DVT prophylaxis: on warfarin, therapeutic   FEN/GI:  Low sodium diet  pantoprazole  Miralax   Dispo: Patient's acute medical issues have resolved. Will plan for discharge today. Follow up has been scheduled with his neurologist and PCP.  This is a Psychologist, occupationalMedical Student Note.  The care of the patient was discussed with Dr. Garald BraverKennerly and the assessment and plan formulated with their assistance.  Please see their attached note for official documentation of the daily encounter.   LOS: 2 days   [name] CarmelNida Tyan Graham, Med Student 03/08/2014, 2:06 PM

## 2014-03-08 NOTE — Discharge Summary (Deleted)
Hospital Course:   [name] is an 80 yo male with multiple chronic diseases, including severe aortic stenosis, atrial fibrillation, HTN, and Parkinson's disease, who was admitted for dizziness.    Acute on Chronic Diastolic Heart Failure: He was mildly volume overloaded on admission (crackles, trace bilateral LE edema) and he received furosemide 40mg  IV with UOP of ~1L. He was continued on his home dose of Lasix 80mg  PO and diuresed about ~3L during this hospital stay. He is euvolemic at the time of discharge. His weight is currently 277Ibs and was 279lbs on admission. However, he weighed 287lbs during his last Cardiology visit on 7/28 when he was found to be euvolemic. EKG showed rate controlled atrial fibrillation but there were no changes when compared to previous EKGs. CXR showed mild pulmonary vascular prominence. His O2 saturation was stable throughout his stay at 99% on room air. His pro-BNP on admission was also elevated to 3125, which is high for him as his baseline is around 2000. Will receive Heart Failure booklet and education (daily weights at home, home health, low salt diet, etc) from nurse before discharge.  Dizziness: Etiology is unclear. His morning dizziness but could have been medication-induced (was on gabapentin as well as lorazepam and flomax), progression of Parkinson's disease, or due to postural hypotension. Orthostatic hypotension is unlikely due to slightly volume overloaded status on initial exam. For dizziness while walking, etiology may be cardiac, including arrhythmia or ACS given his PMH of severe AS and A. Fib. He presented to the ED on 7/25 with similar symptoms and had CT and MRI brain negative for acute intracranial abnormalities. Troponins were negative x 3, and no EKG changes or new abnormalities on telemetry were noticed during the hospital stay. Gabapentin was discontinued on admission. Physical Therapy report indicated no acute concerns and appropriate ambulation  without dizziness.  Numbness: Patient complained of intermittent numbness/positional peripheral neuropathy that begins in both feet. He stated that it travels cranially, occasionally making his tongue numb, but that it only traveled to above his knees during this hospitalization. Patient was evaluated and complete neurological exam showed no changes from baseline. Physical therapy reported to gait abnormalities.  Neurology appointment made for 8/19 to perform NCV-EMG as outpatient.  Patient does not complain of claudication, but has diminished pulses, no hair on bilateral feet with chronic venous stasis changes. Recommend performing bilateral ABIs as an outpatient.   Chronic Atrial Fibrillation: His INR was therapeutic throughout his hospital stay. INR was 2.64 on admission and is 3/13 today. Being titrated by pharmacy and patient will get coumadin check on 8/10.  HTN: Mildly elevated on presentation at 151/81. Was continued on home Metoprolol XL 50 mg PO, daily. BP at discharge was 128/75.  Macrocytosis: Admitted with MCV of 104.7, now MCV 102.3 on discharge. Patient states he takes Vit B12 at home. B12 level was 914 and folate was >1000.   Parkinson's Disease: with mild dementia but A&O x3 during hospital stay. Resting tremor noticed on exam, and home dose of carbidopa-levodopa 50-200 TID was continued.

## 2014-03-08 NOTE — Discharge Summary (Signed)
  Date: 03/08/2014  Patient name: [name] Graham  Medical record number: [id number]  Date of birth: 01/23/1929   This patient has been seen and the plan of care was discussed with the house staff. Please see their note for complete details. I concur with their findings with the following additions/correction: agree that further work up for intermittent peripheral neuropathy can be best worked up by his neurologist and upcoming nerve conduction study.  Judyann Munsonynthia Margurette Brener, MD 03/08/2014, 9:22 PM

## 2014-03-08 NOTE — Discharge Summary (Signed)
Name: [name] Graham MRN: [id number] DOB: 06-Jun-1929 80 y.o. PCP: Thelma Comp, MD  Date of Admission: 03/06/2014 11:11 AM Date of Discharge: 03/08/2014 Attending Physician: Judyann Munson, MD  Discharge Diagnosis: Principal Problem:   Acute on chronic diastolic heart failure Active Problems:   HYPERCHOLESTEROLEMIA  IIA   HYPERTENSION, BENIGN   Aortic stenosis, severe   Atrial fibrillation   Parkinson's disease dementia   Dizziness  Discharge Medications:   Medication List    STOP taking these medications       gabapentin 100 MG capsule  Commonly known as:  NEURONTIN      TAKE these medications       allopurinol 300 MG tablet  Commonly known as:  ZYLOPRIM  Take 300 mg by mouth daily.     ARTHRITIS PAIN RELIEF 650 MG CR tablet  Generic drug:  acetaminophen  Take 650 mg by mouth. Take 2 tabs AM and 1 tab PM     aspirin 81 MG chewable tablet  Chew 81 mg by mouth daily.     carbidopa-levodopa 50-200 MG per tablet  Commonly known as:  SINEMET CR  Take 1 tablet by mouth 3 (three) times daily.     Fish Oil 1000 MG Caps  Take 1 capsule by mouth 2 (two) times daily.     fluticasone 50 MCG/ACT nasal spray  Commonly known as:  FLONASE  Place 1 spray into both nostrils daily as needed for allergies.     furosemide 80 MG tablet  Commonly known as:  LASIX  Take 1 tablet (80 mg total) by mouth daily.     LORazepam 1 MG tablet  Commonly known as:  ATIVAN  Take 1 tablet (1 mg total) by mouth 2 (two) times daily as needed for anxiety.     OSTEO BI-FLEX ADV DOUBLE ST PO  Take 2 tablets by mouth daily.     pantoprazole 40 MG tablet  Commonly known as:  PROTONIX  Take 40 mg by mouth daily.     polyethylene glycol packet  Commonly known as:  MIRALAX / GLYCOLAX  Take 17 g by mouth every evening.     potassium chloride SA 20 MEQ tablet  Commonly known as:  K-DUR,KLOR-CON  Take 20 mEq by mouth daily.     pravastatin 20 MG tablet  Commonly known as:  PRAVACHOL    Take 20 mg by mouth daily.     senna 8.6 MG tablet  Commonly known as:  SENOKOT  Take 3 tablets by mouth at bedtime.     sucralfate 1 G tablet  Commonly known as:  CARAFATE  Take 1 g by mouth 2 (two) times daily.     SYNTHROID 175 MCG tablet  Generic drug:  levothyroxine  Take 175 mcg by mouth daily.     tamsulosin 0.4 MG Caps capsule  Commonly known as:  FLOMAX  Take 0.4 mg by mouth daily after supper.     TOPROL XL 50 MG 24 hr tablet  Generic drug:  metoprolol succinate  Take 50 mg by mouth daily.     warfarin 2.5 MG tablet  Commonly known as:  COUMADIN  Take 2.5-5 mg by mouth daily at 6 PM. Takes 2.5mg  (1 tablet) daily except 5mg  (2 tablets) on Monday and Friday        Disposition and follow-up:   Mr.Aveion D Sternberg was discharged from [facility] in Good condition.  At the hospital follow up visit please address:  1.  Please assure that he is no longer taking gabapentin. He needs to keep his appointment with Dr. Everlena Cooper in Neurology on 8/19. Please consider ordering ABIs.   2.  Labs / imaging needed at time of follow-up: Repeat INR check on 8/10 or 8/11  3.  Pending labs/ test needing follow-up: None  Follow-up Appointments:     Follow-up Information   Follow up with JAFFE, ADAM ROBERT, DO On 03/21/2014. (10:45AM, follow up for numbness and neuropathy)    Specialty:  Neurology   Contact information:   [address] [place] [address] [phone]       Follow up with Renae Fickle, MD On 03/13/2014. (10:15AM)    Specialty:  Family Medicine   Contact information:   [address] [place] [address] [phone]       Discharge Instructions: Discharge Instructions   Diet - low sodium heart healthy    Complete by:  As directed      Increase activity slowly    Complete by:  As directed            Consultations:  None  Procedures Performed:  Dg Chest 2 View  03/06/2014   CLINICAL DATA:  Dizziness and  weakness.  EXAM: CHEST  2 VIEW  COMPARISON:  Chest x-ray 03/18/2012.  FINDINGS: Mediastinum and hilar structures normal. Severe cardiomegaly. Mild pulmonary vascular prominence. No focal pulmonary infiltrate. No pleural effusion or pneumothorax. No acute bony abnormality.  IMPRESSION: 1. Severe cardiomegaly with mild pulmonary vascular prominence. 2. No focal pulmonary infiltrate.   Electronically Signed   By: Maisie Fus  Register   On: 03/06/2014 12:40   Ct Head Wo Contrast  02/24/2014   CLINICAL DATA:  80 year old male with left leg numbness and tingling. Right-sided headache.  EXAM: CT HEAD WITHOUT CONTRAST  TECHNIQUE: Contiguous axial images were obtained from the base of the skull through the vertex without intravenous contrast.  COMPARISON:  03/18/2012 and prior head CTs dating back to 08/03/2003  FINDINGS: Atrophy, chronic small-vessel white matter ischemic changes and remote infarcts within the posterior right parietal and left occipital regions noted.  No acute intracranial abnormalities are identified, including mass lesion or mass effect, hydrocephalus, extra-axial fluid collection, midline shift, hemorrhage, or acute infarction.  The visualized bony calvarium is unremarkable.  IMPRESSION: No evidence of acute intracranial abnormality.  Atrophy, chronic small-vessel white matter ischemic changes and remote infarcts as described.   Electronically Signed   By: Laveda Abbe M.D.   On: 02/24/2014 16:32   Mr Brain Wo Contrast  02/24/2014   CLINICAL DATA:  Left leg tingling and numbness.  EXAM: MRI HEAD WITHOUT CONTRAST  TECHNIQUE: Multiplanar, multiecho pulse sequences of the brain and surrounding structures were obtained without intravenous contrast.  COMPARISON:  Head CT 02/24/2014 and MRI 08/22/2011  FINDINGS: There is no acute infarct, mass, midline shift, or extra-axial fluid collection. A punctate focus of susceptibility artifact in the left frontal lobe near the vertex may reflect a remote  microhemorrhage. Lacunar infarcts are again noted in the basal ganglia and deep cerebral white matter. Patchy and confluent T2 hyperintensities in the cerebral white matter are compatible with advanced chronic small vessel ischemic disease. Small, remote cortical infarcts are again seen in the right parietal and left occipital lobes. There is moderate cerebral atrophy.  Abnormal appearance of the distal right vertebral artery flow void is unchanged. Other major intracranial vascular flow voids are unchanged. Prior right cataract extraction is noted. A small  right maxillary sinus mucous retention cyst is noted. Mastoid air cells are clear.  IMPRESSION: 1. No evidence of acute intracranial abnormality. 2. Advanced chronic small vessel ischemic disease, remote lacunar infarcts, and cerebral atrophy.  These results were called by telephone at the time of interpretation on 02/24/2014 at 7:05 pm to Dr. Mahala MenghiniSamtani, who verbally acknowledged these results.   Electronically Signed   By: Sebastian AcheAllen  Grady   On: 02/24/2014 19:11   Admission HPI:  Mr. 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 He states that this morning he walked about 150 feet and started feeling dizzy with blurry vision that lasted for about 2 hours but subsided by the time he arrived in the ED. He reports that for the past two weeks he has been feeling dizzy with occasional lightheadedness in the morning. He had started a new medication for "arthritis" 2 weeks ago that caused severe dizziness but he states that he stopped this medication, he does not know the name of this medication. He was started on gabapentin by his Neurologist last week but reports hat his neuropathy symptoms have not improved.  He denies shortness of  breath. He does not know his dry weight and does not weight himself daily. For the past week he reports normal intake per mouth and actually increased water intake yesterday with extra 2 cups as he had been instructed by his Cardiologist. 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 His diet has been high in sodium, he explains that he usually eats a ham biscuit almost every morning.  In the ED, he received furosemide 40mg  IV with UOP of ~1L. His pro-BNP was elevated to 3125, which is high for him as his baseline is around 2000. Troponin x1 negative. EKG with rate controlled A.fib but no changes. CXR with mild pulmonary vascular prominence. He had O2 saturation stable at 99% on room air. His weight is 279lbs here and he weighed 287lbs during his last Cardiology visit on 7/28 when he was found to be euvolemic.    Hospital Course by problem list:  Acute on Chronic Diastolic Heart Failure: He was mildly volume overloaded on admission (with bibasilar lung crackles, trace bilateral LE edema) and pro-BNP on admission elevated to 3125, which is high for him as his baseline is around 2000.and he received furosemide 40mg  IV with prompt UOP of ~1L. He was continued on his home dose of Lasix 80mg  PO and diuresed about ~3L during this hospital stay. He is euvolemic at the time of discharge. His weight is currently 277Ibs and was 279lbs on admission. However, he weighed 287lbs during his last Cardiology visit on 7/28 when he was found to be euvolemic. EKG showed rate controlled atrial fibrillation but there were no changes when compared to previous EKGs. CXR showed mild pulmonary vascular prominence. His O2 saturation was stable throughout his stay at 99% on room air. He will have Heart Failure nurse Home Health follow up after his discharge and will continue following up with his PCP as needed.   Dizziness: Multifactorial but improved prior to his  discharge. His morning dizziness have been medication-induced (was on gabapentin as well as lorazepam and flomax), progression of Parkinson's disease,  or due to postural hypotension. Orthostatic hypotension is unlikely due to slightly volume overloaded status on initial exam. Acute MI was ruled out with troponins were negative x 3, and no EKG changes or new abnormalities on telemetry noticed during the hospital stay. His gabapentin that had been started last week by his Neurologist for peripheral neuropathy was discontinued and his dizziness improved. He had Physical Therapy evaluation with no recommendation for further therapy or new equipment needs. He will follow up with his Neurologist for further evaluation of his dizziness.    Numbness: He complained of intermittent numbness/positional peripheral neuropathy that begins in his left foot and ascends to his abdomen and left face, occasionally making his tongue numb. He experienced left foot numbness but no ascending numbness during this hospitalization with no focal Neurological changes.  Physical therapy reported to gait abnormalities. He has follow up with Neurology appointment on 8/19 for possible NCV-EMG study. Finally, he does not complain of claudication, but has diminished pulses, no hair on bilateral feet with chronic venous stasis changes and we recommend bilateral ABIs int he outpatient setting. .   Chronic Atrial Fibrillation: His INR was therapeutic throughout his hospital stay. INR was 2.64 on admission and is 3.13 today. He will resumed his home warfarin dose. He was instructed to follow up with his PCP/[facility] clinic on 8/10 for INR recheck.   HTN: Mildly elevated on presentation at 151/81. We continued his home Metoprolol XL 50 mg PO, daily. BP at discharge was 128/75.  Macrocytosis: Admitted with MCV of 104.7, now MCV 102.3 on discharge. Patient states he takes Vit B12 at home. B12 level was normal at 914 and folate was ULN at  >1000.    Parkinson's Disease: with mild dementia but A&O x3 during hospital stay. Resting tremor noticed on exam, and home dose of carbidopa-levodopa 50-200 TID was continued.    Discharge Vitals:   BP 134/67  Pulse 67  Temp(Src) 98.9 F (37.2 C) (Oral)  Resp 18  Ht 6' (1.829 m)  Wt 279 lb 12.8 oz (126.916 kg)  BMI 37.94 kg/m2  SpO2 98%  Discharge Labs:  Results for orders placed during the hospital encounter of 03/06/14 (from the past 24 hour(s))  PROTIME-INR     Status: Abnormal   Collection Time    03/08/14  4:25 AM      Result Value Ref Range   Prothrombin Time 32.2 (*) 11.6 - 15.2 seconds   INR 3.13 (*) 0.00 - 1.49  BASIC METABOLIC PANEL     Status: Abnormal   Collection Time    03/08/14  9:45 AM      Result Value Ref Range   Sodium 139  137 - 147 mEq/L   Potassium 3.8  3.7 - 5.3 mEq/L   Chloride 99  96 - 112 mEq/L   CO2 28  19 - 32 mEq/L   Glucose, Bld 99  70 - 99 mg/dL   BUN 24 (*) 6 - 23 mg/dL   Creatinine, Ser 4.54  0.50 - 1.35 mg/dL   Calcium 8.9  8.4 - 09.8 mg/dL   GFR calc non Af Amer 58 (*) >90 mL/min   GFR calc Af Amer 67 (*) >90 mL/min   Anion gap 12  5 - 15  CBC     Status: Abnormal   Collection Time    03/08/14  9:45 AM      Result Value Ref Range   WBC 5.6  4.0 - 10.5 K/uL   RBC 3.84 (*)  4.22 - 5.81 MIL/uL   Hemoglobin 13.3  13.0 - 17.0 g/dL   HCT 21.3  08.6 - 57.8 %   MCV 103.1 (*) 78.0 - 100.0 fL   MCH 34.6 (*) 26.0 - 34.0 pg   MCHC 33.6  30.0 - 36.0 g/dL   RDW 46.9  62.9 - 52.8 %   Platelets 131 (*) 150 - 400 K/uL    Signed: Ky Barban, MD 03/08/2014, 2:00 PM    Services Ordered on Discharge: None Equipment Ordered on Discharge: None

## 2014-03-08 NOTE — Progress Notes (Signed)
I have seen the patient and reviewed the daily progress note by [name] Graham and discussed the care of the patient with them.  See below for documentation of my findings, assessment, and plans.  Subjective: He denied dizziness. He had a little difficulty emptying his bladder earlier today but this improved prior to his discharge to home.   Objective: Vital signs in last 24 hours: Filed Vitals:   03/07/14 2043 03/08/14 0600 03/08/14 1002 03/08/14 1404  BP: 116/59 128/75 134/67 134/70  Pulse: 76 69 67 74  Temp: 98 F (36.7 C) 98 F (36.7 C) 98.9 F (37.2 C) 98 F (36.7 C)  TempSrc: Oral Oral Oral Axillary  Resp: 20 18 18 18   Height:      Weight:  279 lb 12.8 oz (126.916 kg)    SpO2: 94% 96% 98% 90%   Weight change: 1.6 oz (0.045 kg)  Intake/Output Summary (Last 24 hours) at 03/08/14 2020 Last data filed at 03/08/14 1500  Gross per 24 hour  Intake    603 ml  Output    725 ml  Net   -122 ml   Vitals reviewed.  General: Sitting in chair, in NAD  HEENT: PERRL, EOMI, no scleral icterus  Cardiac: Irregularly irregular, no rubs, harsh systolic murmur, no gallop  Pulm: clear to auscultation bilaterally, no wheezes, rales, or rhonchi  Abd: soft, nontender, nondistended, BS present  Ext: warm and well perfused, trace pitting edema  Neuro: alert and oriented X3, speech fluent with no aphasia, cranial nerves II-XII intact, no tongue fasciculations, visual fields normal bilaterally, follows 3-step commands, strength and sensation to light touch equal in bilateral upper and lower extremities. Reflexes 2+ for brachioradialis, patellar and 1+ for Achilles bilaterally. Finger-to-nose test and heel-to-shin test normal. Has bilateral tremor at rest (chronic).  Lab Results: Reviewed and documented in Electronic Record Micro Results: Reviewed and documented in Electronic Record Studies/Results: Reviewed and documented in Electronic Record Medications: I have reviewed the patient's  current medications. Scheduled Meds: . allopurinol  300 mg Oral Daily  . aspirin  81 mg Oral Daily  . carbidopa-levodopa  1 tablet Oral TID  . furosemide  80 mg Oral Daily  . levothyroxine  175 mcg Oral QAC breakfast  . metoprolol succinate  50 mg Oral Daily  . pantoprazole  40 mg Oral Daily  . polyethylene glycol  17 g Oral QPM  . simvastatin  10 mg Oral q1800  . sodium chloride  3 mL Intravenous Q12H  . sucralfate  1 g Oral BID  . tamsulosin  0.Graham mg Oral QPC supper  . [START ON 03/12/2014] warfarin  5 mg Oral Once per day on Mon Fri   And  . warfarin  2.5 mg Oral Once per day on Sun Tue Wed Thu Sat  . Warfarin - Pharmacist Dosing Inpatient   Does not apply q1800   Continuous Infusions:  PRN Meds:.acetaminophen, fluticasone, LORazepam Assessment/Plan: 80 yo man with multiple chronic diseases, including severe aortic stenosis, atrial fibrillation, and HTN, who presented to the ED for likely acute on chronic diastolic heart failure due to volume overload who is now euvolemic with resolved dizziness.   Dizziness: Resolved. Likely multifactorial, due to medications (gabapentin, lorazepam, and flomax),  postural hypotension/autonomic dysfunction due to Parkinson's disease. He follows up with outpatient Neurology ([name] Graham) with recommendations for NCV-EMG of the left upper and lower extremities if left sided numbness reocurred. MI has been ruled out with troponin x3 negative, no EKG changes,  no arrhythmias on telemetry. He reports that his dizziness has improved after we discontinued his gabapentin. He had no episode of left leg numbness today. Per PT, he is ambulating appropriately and no acute or f/u PT needs.  -Continue warfarin per Pharmacy  -Continue telemetry   Numbness: Of left LE. Resolved. Likely intermittent/positional peripheral neuropathy. He has seen Neurology as outpatient for this problem with normal tests per above and may order NCV-EMG as outpatient. He denies claudication  but has diminished DP and TP pulses bilaterally and no hair in bilateral feet with chronic venous statis changes.  -Recommend ABIs as outpatient -Patient instructed to keep a diary of occurrences of numbness.   Acute on Chronic diastolic heart failure: Resolved. He was mildly overloaded in the ED but has diuresed over 3L since admission. He is on Lasix 80mg  daily at home and reports complianc e with this medication. He reports a high sodium diet at home (ham biscuit every morning). Does not take daily weights at home. On physical exam he appears euvolemic.  -Strict I&Os  -Continue home Lasix 80mg  daily  -Will need Heart Failure RN education (inpatient and/or outpatient)   HTN: Mildly elevated on admission at 151/81, but this has resolved with current BP 104/64  -Continue home Metoprolol XL 50 mg PO, daily   CAD: Continue home Aspirin 81 mg PO daily, Simvastatin 10mg  PO daily.   Chronic Atrial Fibrillation: His INR is slightly supratherapeutic today at 3.1 with no signs of bleeding.   -warfarin per pharmacy  -metoprolol XL 50mg  daily   Hypothyroidism: Last TSH of 5.528, slightly elevated on 7/29. Continue home synthroid at 175mcg daily.   Macrocytosis: MCV 102.3 but normal Hg 13.3. Vit B12 mildly elevated at 914, folate level elevated at >1000  -Patient reports taking Vit B12 at home, will resume on discharge  Parkinson's Disease: with mild dementia but A&O x3. Likely contributing to his dizziness/numbness. Continue home meds.  -carbidopa-levodopa 50-200 TID   Osteoarthritis of knees: Continue home Acetaminophen 650mg  PO, PRN pain   Anxiety: Continue home Lorazepam 1mg  PO BID prn   BPH: Continue tamsulosin 0.Graham mg cap   DVT prophylaxis: on warfarin, therapeutic  FEN/GI:  Low sodium diet  pantoprazole  Miralax   Dispo: Disposition is deferred at this time, awaiting improvement of current medical problems. Anticipated discharge in approximately today.    The patient does have a  current PCP ([name] CompJohn E. Gage, MD) and does not [facility] after discharge.  The patient does not have transportation limitations that hinder transportation to clinic appointments.  .Services Needed at time of discharge: Y = Yes, Blank = No PT:   OT:   RN: Heart failure RN  Equipment:   Other:     LOS: 2 days   [name] BarbanSolianny D Mithra Spano, MD 03/08/2014, 8:20 PM

## 2014-03-08 NOTE — Discharge Instructions (Signed)
It was a pleasure taking care of you. You were hospitalized and treated for acute on chronic diastolic heart failure and for dizziness.   It is very important that you STOP taking gabapentin (neurontin).   Continue taking Lasix (furosemide) every day. This is your diuretic or water pill.   Start keeping a diary of when you experience numbness.   Heart Failure  SPECIAL INSTRUCTIONS  AVOID STRAINING  STOP ANY ACTIVITY THAT CAUSES CHEST PAIN, SHORTNESS OF BREATH, DIZZINESS, SWEATING, OR EXCESSIVE WEAKNESS.  SPECIAL INSTRUCTIONS FOR PATIENTS WITH HEART FAILURE:  Continue to follow the instructions in your Heart Failure Patient Education information that you received during your hospital stay.  Record daily weight on same scale at same time of day.  If your doctor did not discuss your diet or activity in the information above, please follow a Heart Healthy Low Sodium diet and increase your activity as you feel able. Call your doctor: (Anytime you feel any of the following symptoms)  3-4 pound weight gain in 1-2 days or 2 pounds overnight  Shortness of breath, with or without a dry hacking cough  Swelling in the hands, feet or stomach  If you have to sleep on extra pillows at night in order to breathe  Heart Failure Heart failure means your heart has trouble pumping blood. This makes it hard for your body to work well. Heart failure is usually a long-term (chronic) condition. You must take good care of yourself and follow your doctor's treatment plan. HOME CARE  Take your heart medicine as told by your doctor.  Do not stop taking medicine unless your doctor tells you to.  Do not skip any dose of medicine.  Refill your medicines before they run out.  Take other medicines only as told by your doctor or pharmacist.  Stay active if told by your doctor. The elderly and people with severe heart failure should talk with a doctor about physical activity.  Eat heart-healthy foods.  Choose foods that are without trans fat and are low in saturated fat, cholesterol, and salt (sodium). This includes fresh or frozen fruits and vegetables, fish, lean meats, fat-free or low-fat dairy foods, whole grains, and high-fiber foods. Lentils and dried peas and beans (legumes) are also good choices.  Limit salt if told by your doctor.  Cook in a healthy way. Roast, grill, broil, bake, poach, steam, or stir-fry foods.  Limit fluids as told by your doctor.  Weigh yourself every morning. Do this after you pee (urinate) and before you eat breakfast. Write down your weight to give to your doctor.  Take your blood pressure and write it down if your doctor tells you to.  Ask your doctor how to check your pulse. Check your pulse as told.  Lose weight if told by your doctor.  Stop smoking or chewing tobacco. Do not use gum or patches that help you quit without your doctor's approval.  Schedule and go to doctor visits as told.  Nonpregnant women should have no more than 1 drink a day. Men should have no more than 2 drinks a day. Talk to your doctor about drinking alcohol.  Stop illegal drug use.  Stay current with shots (immunizations).  Manage your health conditions as told by your doctor.  Learn to manage your stress.  Rest when you are tired.  If it is really hot outside:  Avoid intense activities.  Use air conditioning or fans, or get in a cooler place.  Avoid caffeine and alcohol.  Wear loose-fitting, lightweight, and light-colored clothing.  If it is really cold outside:  Avoid intense activities.  Layer your clothing.  Wear mittens or gloves, a hat, and a scarf when going outside.  Avoid alcohol.  Learn about heart failure and get support as needed.  Get help to maintain or improve your quality of life and your ability to care for yourself as needed. GET HELP IF:   You gain 03 lb/1.4 kg or more in 1 day or 05 lb/2.3 kg in a week.  You are more short of  breath than usual.  You cannot do your normal activities.  You tire easily.  You cough more than normal, especially with activity.  You have any or more puffiness (swelling) in areas such as your hands, feet, ankles, or belly (abdomen).  You cannot sleep because it is hard to breathe.  You feel like your heart is beating fast (palpitations).  You get dizzy or light-headed when you stand up. GET HELP RIGHT AWAY IF:   You have trouble breathing.  There is a change in mental status, such as becoming less alert or not being able to focus.  You have chest pain or discomfort.  You faint. MAKE SURE YOU:   Understand these instructions.  Will watch your condition.  Will get help right away if you are not doing well or get worse. Document Released: 04/28/2008 Document Revised: 12/04/2013 Document Reviewed: 09/05/2012 [facility] Patient Information 2015 [place]. This information is not intended to replace advice given to you by your health care provider. Make sure you discuss any questions you have with your health care provider.

## 2014-03-08 NOTE — Progress Notes (Signed)
Pt.was discharged around 1828. He had no c/o pain and no signs of distress. He was transported by wheelchair to private vehicle.

## 2014-03-08 NOTE — Progress Notes (Signed)
ANTICOAGULATION CONSULT NOTE - Follow up Consult  Pharmacy Consult for coumadin Indication: atrial fibrillation  Allergies  Allergen Reactions  . Morphine And Related Other (See Comments)    Goes Crazy  . Nifedipine Other (See Comments)    "makes me go crazy" procardia    Patient Measurements: Height: 6' (182.9 cm) Weight: 279 lb 12.8 oz (126.916 kg) (c scale) IBW/kg (Calculated) : 77.6   Vital Signs: Temp: 98 F (36.7 C) (08/06 0600) Temp src: Oral (08/06 0600) BP: 128/75 mmHg (08/06 0600) Pulse Rate: 69 (08/06 0600)  Labs:  Recent Labs  03/06/14 1137 03/06/14 1820 03/07/14 03/08/14 0425  HGB 13.5  --  13.3  --   HCT 40.1  --  40.3  --   PLT 128*  --  136*  --   LABPROT 28.2*  --  29.2* 32.2*  INR 2.64*  --  2.76* 3.13*  CREATININE 1.11  --  0.94  --   TROPONINI  --  <0.30 <0.30  --     Estimated Creatinine Clearance: 83.4 ml/min (by C-G formula based on Cr of 0.94).   Medical History: Past Medical History  Diagnosis Date  . Pulmonary embolus     bilateral  . Hypothyroid   . Hypertension   . Chronic atrial fibrillation     INR therpeutic on Coumadin  . Dementia     mild, with hallucinations status post psychiatric evaluation during admission  . Kidney cyst, acquired   . A-fib   . CAD (coronary artery disease)     non-obstructive by cath 2006, no stents, ejection fraction  . Leukocytosis   . Tremor   . Parkinson disease   . Severe aortic stenosis   . HYPERCHOLESTEROLEMIA  IIA 12/20/2008    Qualifier: Diagnosis of  By: Riley KillStuckey, MD, Johny SaxFACC, Thomas David   . Peripheral neuropathy   . Weakness of both legs 03/18/2012  . Macrocytic anemia 03/19/2012  . Urinary incontinence 03/28/2012  . Parkinson's disease dementia 01/20/2013  . Limited mobility   . Morbid obesity with BMI of 40.0-44.9, adult   . Chronic diastolic congestive heart failure     Medications:  Prescriptions prior to admission  Medication Sig Dispense Refill  . acetaminophen (ARTHRITIS  PAIN RELIEF) 650 MG CR tablet Take 650 mg by mouth. Take 2 tabs AM and 1 tab PM       . allopurinol (ZYLOPRIM) 300 MG tablet Take 300 mg by mouth daily.        Marland Kitchen. aspirin 81 MG chewable tablet Chew 81 mg by mouth daily.      . carbidopa-levodopa (SINEMET CR) 50-200 MG per tablet Take 1 tablet by mouth 3 (three) times daily.  90 tablet  0  . fluticasone (FLONASE) 50 MCG/ACT nasal spray Place 1 spray into both nostrils daily as needed for allergies.       . furosemide (LASIX) 80 MG tablet Take 1 tablet (80 mg total) by mouth daily.  30 tablet  1  . gabapentin (NEURONTIN) 100 MG capsule Take 1 capsule (100 mg total) by mouth 3 (three) times daily.  90 capsule  0  . levothyroxine (SYNTHROID) 175 MCG tablet Take 175 mcg by mouth daily.        Marland Kitchen. LORazepam (ATIVAN) 1 MG tablet Take 1 tablet (1 mg total) by mouth 2 (two) times daily as needed for anxiety.  30 tablet  0  . metoprolol (TOPROL XL) 50 MG 24 hr tablet Take 50 mg by mouth daily.        .Marland Kitchen  Misc Natural Products (OSTEO BI-FLEX ADV DOUBLE ST PO) Take 2 tablets by mouth daily.       . Omega-3 Fatty Acids (FISH OIL) 1000 MG CAPS Take 1 capsule by mouth 2 (two) times daily.       . pantoprazole (PROTONIX) 40 MG tablet Take 40 mg by mouth daily.       . polyethylene glycol (MIRALAX / GLYCOLAX) packet Take 17 g by mouth every evening.      . potassium chloride SA (K-DUR,KLOR-CON) 20 MEQ tablet Take 20 mEq by mouth daily.      . pravastatin (PRAVACHOL) 20 MG tablet Take 20 mg by mouth daily.       Marland Kitchen senna (SENOKOT) 8.6 MG tablet Take 3 tablets by mouth at bedtime.      . sucralfate (CARAFATE) 1 G tablet Take 1 g by mouth 2 (two) times daily.        . tamsulosin (FLOMAX) 0.4 MG CAPS capsule Take 0.4 mg by mouth daily after supper.       . warfarin (COUMADIN) 2.5 MG tablet Take 2.5-5 mg by mouth daily at 6 PM. Takes 2.5mg  (1 tablet) daily except 5mg  (2 tablets) on Monday and Friday        Assessment: 80 yo M admitted with CC of dizziness.  On coumadin  PTA for Afib and also has hx of B PE.  Presents with "numbness in head" and tingling on left side. MRI 7/25 shows remote lacunar infarcts. INR slightly elevated today. Home dose of coumadin was entered incorrectly and pt received 5 mg last 2 days  Hgb 13.3, plts up to 136- last were 139 in July. 190s in 2014. Home dose 2.5 qday x 5 mg M,F.  Goal of Therapy:  INR 2-3 Monitor platelets by anticoagulation protocol: Yes   Plan:  Continue home dose of 2.5 mg daily x 5 mg M,F Daily INR for now  Talbert Cage, PharmD.  Clinical Pharmacist Pager [phone]

## 2014-03-08 NOTE — Care Management Note (Signed)
    Page 1 of 2   03/08/2014     2:27:11 PM CARE MANAGEMENT NOTE 03/08/2014  Patient:  [name] Graham,[name] Graham   Account Number:  [id number]  Date Initiated:  03/08/2014  Documentation initiated by:  [facility],Agnieszka Newhouse  Subjective/Objective Assessment:   80 yo male with multiple chronic diseases, including severe aortic stenosis, atrial fibrillation, and HTN, who presented to the ED for likely acute on chronic diastolic heart failure due to volume overload.//Home alone.     Action/Plan:   -Admit to telemetry  -cycle troponin x3  -repeat EKG .//[facility].   Anticipated DC Date:  03/08/2014   Anticipated DC Plan:  HOME W HOME HEALTH SERVICES      DC Planning Services  CM consult      [facility]   Choice offered to / List presented to:  C-4 Adult Children        HH arranged  HH-1 RN  HH-10 DISEASE MANAGEMENT  HH-2 PT      HH agency  [facility]   Status of service:  Completed, signed off Medicare Important Message given?   (If response is "NO", the following Medicare IM given date fields will be blank) Date Medicare IM given:   Medicare IM given by:   Date Additional Medicare IM given:   Additional Medicare IM given by:    Discharge Disposition:  HOME W HOME HEALTH SERVICES  Per UR Regulation:  Reviewed for med. necessity/level of care/duration of stay  If discussed at Long Length of Stay Meetings, dates discussed:    Comments:  03/08/14 1400 Draysen Weygandt J. Lucretia RoersWood, RN, BSN, [employer]CM [phone] Spoke with pt at bedside regarding discharge planning [facility]. Offered pt list of home health agencies to choose from.  Pt chose [facility] to render services. Tamala BariMary Manley of [facility] notified.  No DME needs identified at this time.

## 2014-03-14 ENCOUNTER — Telehealth (HOSPITAL_COMMUNITY): Payer: Self-pay | Admitting: Dentistry

## 2014-03-14 NOTE — Telephone Encounter (Signed)
03/14/2014  Patient:            [name] Graham Date of Birth:  05/04/1929 MRN:                [id number]  He patient's son, [name] Graham, was contacted today to arrange for a dental evaluation and discussion of oral surgical procedures in the operating room. [name] Graham indicated that the patient was not feeling well would need to defer dental treatment at this time [name] Graham that indicated that he would contact dental medicine to arrange for future dental treatment once the patient's health is improved.  Charlynne Panderonald F. Kulinski, DDS

## 2014-03-21 ENCOUNTER — Ambulatory Visit (INDEPENDENT_AMBULATORY_CARE_PROVIDER_SITE_OTHER): Payer: Medicare Other | Admitting: Neurology

## 2014-03-21 ENCOUNTER — Encounter: Payer: Self-pay | Admitting: Neurology

## 2014-03-21 VITALS — BP 140/64 | HR 100 | Temp 98.2°F | Resp 16 | Wt 284.0 lb

## 2014-03-21 DIAGNOSIS — G20 Parkinson's disease: Secondary | ICD-10-CM

## 2014-03-21 DIAGNOSIS — R200 Anesthesia of skin: Secondary | ICD-10-CM

## 2014-03-21 DIAGNOSIS — R209 Unspecified disturbances of skin sensation: Secondary | ICD-10-CM

## 2014-03-21 DIAGNOSIS — G629 Polyneuropathy, unspecified: Secondary | ICD-10-CM

## 2014-03-21 DIAGNOSIS — G609 Hereditary and idiopathic neuropathy, unspecified: Secondary | ICD-10-CM

## 2014-03-21 NOTE — Progress Notes (Signed)
NEUROLOGY FOLLOW UP OFFICE NOTE  [name] Graham [id number]  HISTORY OF PRESENT ILLNESS: [name] Graham is an 80 year old right-handed man with history of Parkinson's disease, type II diabetes mellitus, atrial fibrillation and DVT on warfarin and with IVC filter, severe aortic stenosis, chronic diastolic heart failure, hypertension and hyperlipidemia who follows up for transient left lower numbness, peripheral neuropathy and Parkinson's disease.  Records and images reviewed.  UPDATE: 02/28/14 LABS:  ANA positive with 1:40 speckled pattern, Sed Rate 1, SPEP with nonspecific pattern and IFE with no monoclonal gammopathy, TSH 5.528, B12 770.   He did not have the NCV-EMG because he was hospitalized earlier this month for recurrent episode of traveling left sided numbness with pain in the left side of the torso.  He also experienced transient numbness of the tongue.  He was found to have acute on chronic diastolic heart failure and dizziness.  No specific cause of dizziness or numbness was found.  Repeat imaging was not performed because it had just recently been done.  The gabapentin was discontinued.  He still notes numbness in both lower legs.  Parkinson's symptoms are stable.  HISTORY: In early summer, he began noticing numbness and tingling in his feet and a little bit in his hands.  Over the past week, he has had episodes of transient numbness and tingling.  It usually occurs when he is sitting and relaxing.  He suddenly feels a numb pins and needles sensation in his toes that spreads over the entire left foot and travels up the entire left leg up to just below the ribcage on his left side.  It is painful, particularly at the ribcage.  It lasts about 3 hours and resolves.  He says that if he gets up and walks, it resolves sooner, maybe 2 hours.  There is no associated weakness, shooting pain from the back down the leg, neck pain or bowel or bladder incontinence.  On 02/22/14, he presented to the ED,  where CT of the head revealed no bleed or acute infarct and MRI of the brain without contrast revealed small vessel ischemic changes and remote lacunar infarcts in the basal ganglia, but no acute infarcts.  UA was negative and EKG was unrevealing.  The neurohospitalist thought that symptoms may be related to worsening Parkinson's.  He was evaluated by his cardiologist, Dr. Excell Seltzerooper, and everything appeared stable from a cardiac perspective.  These episodes have occurred about once a day.   He was diagnosed with Parkinson's disease about 4-5 years ago.  Symptoms started with tremor in both hands.  Gradually he had significant gait instability and leg weakness. He takes Sinemet CR 50-200mg  three times daily which is effective for him.  He is able to ambulate.  He still has a tremor but is able to use utensils.  [facility]  Medications include:  Sinemet CR 50-200mg  three times daily, levothyroxine 175mcg, warfarin, pravastatin 20mg , furosemide, lorazepam 1mg  twice daily as needed, metoprolol 50mg   PAST MEDICAL HISTORY: Past Medical History  Diagnosis Date  . Pulmonary embolus     bilateral  . Hypothyroid   . Hypertension   . Chronic atrial fibrillation     INR therpeutic on Coumadin  . Dementia     mild, with hallucinations status post psychiatric evaluation during admission  . Kidney cyst, acquired   . A-fib   . CAD (coronary artery disease)     non-obstructive by cath 2006, no stents, ejection  fraction  . Leukocytosis   . Tremor   . Parkinson disease   . Severe aortic stenosis   . HYPERCHOLESTEROLEMIA  IIA 12/20/2008    Qualifier: Diagnosis of  By: [name], MD, Johny Sax   . Peripheral neuropathy   . Weakness of both legs 03/18/2012  . Macrocytic anemia 03/19/2012  . Urinary incontinence 03/28/2012  . Parkinson's disease dementia 01/20/2013  . Limited mobility   . Morbid obesity with BMI of 40.0-44.9, adult   . Chronic diastolic congestive  heart failure     MEDICATIONS: Current Outpatient Prescriptions on File Prior to Visit  Medication Sig Dispense Refill  . acetaminophen (ARTHRITIS PAIN RELIEF) 650 MG CR tablet Take 650 mg by mouth. Take 2 tabs AM and 1 tab PM       . allopurinol (ZYLOPRIM) 300 MG tablet Take 300 mg by mouth daily.        Marland Kitchen aspirin 81 MG chewable tablet Chew 81 mg by mouth daily.      . fluticasone (FLONASE) 50 MCG/ACT nasal spray Place 1 spray into both nostrils daily as needed for allergies.       . furosemide (LASIX) 80 MG tablet Take 1 tablet (80 mg total) by mouth daily.  30 tablet  1  . levothyroxine (SYNTHROID) 175 MCG tablet Take 175 mcg by mouth daily.        Marland Kitchen LORazepam (ATIVAN) 1 MG tablet Take 1 tablet (1 mg total) by mouth 2 (two) times daily as needed for anxiety.  30 tablet  0  . metoprolol (TOPROL XL) 50 MG 24 hr tablet Take 50 mg by mouth daily.        . Misc Natural Products (OSTEO BI-FLEX ADV DOUBLE ST PO) Take 2 tablets by mouth daily.       . Omega-3 Fatty Acids (FISH OIL) 1000 MG CAPS Take 1 capsule by mouth 2 (two) times daily.       . pantoprazole (PROTONIX) 40 MG tablet Take 40 mg by mouth daily.       . polyethylene glycol (MIRALAX / GLYCOLAX) packet Take 17 g by mouth every evening.      . potassium chloride SA (K-DUR,KLOR-CON) 20 MEQ tablet Take 20 mEq by mouth daily.      . pravastatin (PRAVACHOL) 20 MG tablet Take 20 mg by mouth daily.       Marland Kitchen senna (SENOKOT) 8.6 MG tablet Take 3 tablets by mouth at bedtime.      . sucralfate (CARAFATE) 1 G tablet Take 1 g by mouth 2 (two) times daily.        . tamsulosin (FLOMAX) 0.4 MG CAPS capsule Take 0.4 mg by mouth daily after supper.       . warfarin (COUMADIN) 2.5 MG tablet Take 2.5-5 mg by mouth daily at 6 PM. Takes 2.5mg  (1 tablet) daily except 5mg  (2 tablets) on Monday and Friday      . carbidopa-levodopa (SINEMET CR) 50-200 MG per tablet Take 1 tablet by mouth 3 (three) times daily.  90 tablet  0   No current facility-administered  medications on file prior to visit.    ALLERGIES: Allergies  Allergen Reactions  . Morphine And Related Other (See Comments)    Goes Crazy  . Nifedipine Other (See Comments)    "makes me go crazy" procardia    FAMILY HISTORY: Family History  Problem Relation Age of Onset  . Heart failure Mother   . Microcephaly Father     SOCIAL HISTORY:  History   Social History  . Marital Status: Married    Spouse Name: N/A    Number of Children: 6  . Years of Education: 12   Occupational History  . retired    Social History Main Topics  . Smoking status: Former Smoker -- 2.00 packs/day for 12 years    Types: Cigarettes  . Smokeless tobacco: Never Used     Comment: Quit 40 years ago by report.  . Alcohol Use: No  . Drug Use: No  . Sexual Activity: No   Other Topics Concern  . Not on file   Social History Narrative   PCP is Renae Fickle, Cardiologist is Dr. Excell Seltzer. He receives Medicare.    Power of [name] is his son: Lou Loewe - [phone].    Caretaker is [name]: [phone].      Originally from [place], completed high school. 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    He then lived with wife in [place] after her heart valve replacement.    Wife passed away on 08/20/2013. He now lives in [place], [place] again.   He had a home health nurse caring for him. His daughter had been living with he and his wife since his wife had a valve replacement in 6/13.      Has not smoked in 40 years.     REVIEW OF SYSTEMS: Constitutional: No fevers, chills, or sweats, no generalized fatigue, change in appetite Eyes: No visual changes, double vision, eye pain Ear, nose and throat: No hearing loss, ear pain, nasal congestion, sore throat Cardiovascular: No chest pain, palpitations Respiratory:  No shortness of breath at rest or with exertion, wheezes GastrointestinaI: No nausea, vomiting, diarrhea, abdominal pain, fecal  incontinence Genitourinary:  No dysuria, urinary retention or frequency Musculoskeletal:  No neck pain, back pain Integumentary: No rash, pruritus, skin lesions Neurological: as above Psychiatric: No depression, insomnia, anxiety Endocrine: No palpitations, fatigue, diaphoresis, mood swings, change in appetite, change in weight, increased thirst Hematologic/Lymphatic:  No anemia, purpura, petechiae. Allergic/Immunologic: no itchy/runny eyes, nasal congestion, recent allergic reactions, rashes  PHYSICAL EXAM: Filed Vitals:   03/21/14 0938  BP: 140/64  Pulse: 100  Temp: 98.2 F (36.8 C)  Resp: 16   General: No acute distress Head:  Normocephalic/atraumatic Neck: supple, no paraspinal tenderness, full range of motion Heart:  Regular rate and rhythm Lungs:  Clear to auscultation bilaterally Back: No paraspinal tenderness Neurological Exam: alert and oriented.  Attention span and concentration intact, recent memory intact, mild deficits with remote memory, fund of knowledge intact.  Speech fluent and not dysarthric, language intact.  Reduced upgaze, hypomimia, otherwise CN II-XII intact.  Bulk and tone normal.  Mild cogwheel rigidity in wrists.  Bilateral resting tremor (Right greater than left), reduced finger-thumb tapping.  5/5 throughout.  mildly reduced pinprick sensation in the hands up to the distal forearm.  Pinprick sensation intact.  Reduced vibration sensation in lower extremities.  Gait with normal station and stride with normal arm swing but tremor noted in the right hand.  Romberg negative.  IMPRESSION: 1.  Transient traveling left lower extremity paresthesias.  I have no explanation for this.  It is not a cerebrovascular event, based on its repetitive nature.  It does not fit the dermatomal pattern for a radiculopathy or neuropathy.  He exhibits no other abnormalities to suggest a cord lesion, which would also be unusual since the paresthesias travel and are transient.  It does  not seem like a simple partial seizure.  I am really not sure what to make of the transient numbness of the tongue.  It is a nonfocal finding and doesn't really suggest stroke, however he is already on anticoagulation. 2.  Parkinson's disease.   3.  Peripheral neuropathy.  Incidental finding.  Lab work does not reveal specific etiology.  Exam does not seem as severe as last visit.  Since it does not bother him, I won't pursue NCV at this time. 4.  Cerebrovascular disease  PLAN: Continue Sinemet CR 50-200mg  three times daily. Anticoagulation, statin, blood pressure control for secondary stroke prevention. Follow up in 3 months.  Shon Millet, DO  CC: Renae Fickle, MD

## 2014-03-21 NOTE — Patient Instructions (Signed)
1.  Continue the Sinemet 2.  I am not really sure the cause of the leg numbness and chest pain.  Watch for now. 3.  Follow up in 3 months or as needed.

## 2014-04-05 ENCOUNTER — Telehealth: Payer: Self-pay | Admitting: Neurology

## 2014-04-05 NOTE — Telephone Encounter (Signed)
i have called the pt son several times to get the EMG test set up and the patient was in the hospital and then no answer and now he has fallen and broke something so they dont want to make appt at this time for the EMG

## 2014-06-22 ENCOUNTER — Ambulatory Visit: Payer: Self-pay | Admitting: Neurology

## 2014-06-25 ENCOUNTER — Telehealth: Payer: Self-pay | Admitting: Neurology

## 2014-06-25 NOTE — Telephone Encounter (Signed)
Pt no showed follow up appt w/ Dr. Everlena CooperJaffe on 06/22/14.  [name] DroughtErica - please send no show letter / [name] RaiderSherri

## 2014-07-03 ENCOUNTER — Encounter: Payer: Self-pay | Admitting: *Deleted

## 2014-07-03 NOTE — Progress Notes (Signed)
No show letter sent for 06/22/2014

## 2014-09-13 ENCOUNTER — Encounter: Payer: Self-pay | Admitting: Cardiovascular Disease

## 2014-10-02 ENCOUNTER — Telehealth: Payer: Self-pay | Admitting: Cardiovascular Disease

## 2014-10-02 NOTE — Telephone Encounter (Signed)
Request for STAT surgical clearance:  1. What type of surgery is being performed? TURVP  2. When is this surgery scheduled? 10/03/2014---Depending on how soon he can get off of the blood thinners  3. Are there any medications that need to be held prior to surgery and how long? Blood thinners   4. Name of physician performing surgery? Dr. Tommye Standardaljit Caberwal  5. What is your office phone and fax number? Telephone # [phone] Fax# [phone]  Comments: For Dr. Excell Seltzerooper to give an immediate response Dr. Salvatore Decentaberwal has given his private cell # [phone]

## 2014-10-03 NOTE — Telephone Encounter (Signed)
Dr Excell Seltzerooper is in the Cath lab today, he was paged. Call returned, he will address after his case. SEE Dr Salvatore Decentaberwal private number that was given to Dr Excell Seltzerooper.

## 2014-10-03 NOTE — Telephone Encounter (Signed)
Follow up    Additional info from previous message Surgery is scheduled for 10-10-14.  His PCP  (Dr Samuel GermanyGage) said pt cannot come off coumadin unless pt does the lovenox shot.  They  Need to know today if pt will be cleared so that they will have time to do the lovenox bridge.

## 2014-10-03 NOTE — Telephone Encounter (Signed)
The patient is at high-risk of any surgery. He has severe aortic stenosis and is not an operative candidate. I spoke with Dr Salvatore Decentaberwal and he asked me to formally evaluate the patient for surgery. I will get in touch with the patient and his son, [name], but cannot provide clearance at this point.   [name] 10/03/2014 4:36 PM

## 2014-10-04 ENCOUNTER — Telehealth: Payer: Self-pay | Admitting: Cardiovascular Disease

## 2014-10-04 NOTE — Telephone Encounter (Signed)
I spoke with [name] (pt's daughter) and she said that [name] is not involved in the pt's care at this time because he has had surgery and just got out of ICU. The pt's daughter has been managing the pt at this time. She states the pt's foley cath was reinserted on Tuesday because of enlarged prostate and inability to pass urine.  The pt's coumadin was stopped on Tuesday.  The pt's PCP Dr Samuel GermanyGage feels like the pt's coumadin should not have been stopped due to the pt's risk of stroke. Dr Samuel GermanyGage felt like the pt should be started on lovenox and the pt is scheduled to see Dr Samuel GermanyGage today at 1:45. [name] needs to know if the pt is going to be able to have surgery and what needs to be done with his anticoagulant. i will discuss this pt with Dr Excell Seltzerooper.

## 2014-10-04 NOTE — Telephone Encounter (Signed)
Follow up      Daughter calling back has some questions regarding her father procedure/

## 2014-10-04 NOTE — Telephone Encounter (Signed)
F/U        Pt daughter Dionicio Stallvelyn Radman returning call from today, please call back at [phone].

## 2014-10-04 NOTE — Telephone Encounter (Signed)
Spoke with the patient's son, [name]. I had been called by Dr Salvatore Decentaberwal requesting cardiac clearance for prostate surgery. As noted, the patient has severe inoperable aortic stenosis and he is a very poor candidate for surgery especially under general anesthesia. [name] was not aware that surgery was even being considered. Unfortunately it appears the son is hospitalized and he became quite upset while we were on the telephone. His mother-in-law took the phone and advised that Jason's wife would call me later today. The patient is scheduled to see me 3-29 and will address surgical risk further at that time.  Tonny BollmanMichael Gwenlyn Hottinger MD 10/04/2014 12:47 PM

## 2014-10-04 NOTE — Telephone Encounter (Signed)
Called back and left message on voicemail

## 2014-10-04 NOTE — Telephone Encounter (Signed)
Follow up  Pt daughter following up on Surg Clearance; pt also asking about pt's blood thinner. Please call back and discuss.

## 2014-10-10 NOTE — Telephone Encounter (Signed)
Please see additional telephone encounters for documentation.

## 2014-10-10 NOTE — Telephone Encounter (Signed)
Sounds like a good plan. Thank you.

## 2014-10-10 NOTE — Telephone Encounter (Signed)
I spoke with Renea EeEvelyn and the pt is scheduled to have laser treatment performed on his prostate this afternoon at 2:00 in Dr Suanne Markeraberwal's office.  They will not be administering an epidural with this procedure just local numbing.  Per Renea EeEvelyn the pt is having this procedure performed while on coumadin.  The pt will continue to have a foley for about 2 more weeks and he is currently on Cipro.  Dr Samuel GermanyGage is monitoring the pt's INR while on these medications. The pt will keep scheduled appointment to see Dr Excell Seltzerooper on 10/30/14.

## 2014-10-30 ENCOUNTER — Ambulatory Visit (INDEPENDENT_AMBULATORY_CARE_PROVIDER_SITE_OTHER): Payer: Medicare Other | Admitting: Cardiovascular Disease

## 2014-10-30 ENCOUNTER — Encounter: Payer: Self-pay | Admitting: Cardiovascular Disease

## 2014-10-30 VITALS — BP 102/62 | HR 94 | Ht 72.0 in | Wt 294.8 lb

## 2014-10-30 DIAGNOSIS — I350 Nonrheumatic aortic (valve) stenosis: Secondary | ICD-10-CM | POA: Diagnosis not present

## 2014-10-30 DIAGNOSIS — K088 Other specified disorders of teeth and supporting structures: Secondary | ICD-10-CM

## 2014-10-30 DIAGNOSIS — K0889 Other specified disorders of teeth and supporting structures: Secondary | ICD-10-CM

## 2014-10-30 NOTE — Patient Instructions (Signed)
Your physician recommends that you continue on your current medications as directed. Please refer to the Current Medication list given to you today.  You have been referred to  Dentist Dr. Dian Queenon Kulinski  Your physician wants you to follow-up in: 6 month with Dr. Excell Seltzerooper. You will receive a reminder letter in the mail two months in advance. If you don't receive a letter, please call our office to schedule the follow-up appointment.

## 2014-10-30 NOTE — Progress Notes (Signed)
Cardiology Office Note   Date:  10/31/2014   ID:  [name] Graham, DOB 08/06/1928, MRN [id number]  PCP:  [name], [name], MD  Cardiologist:  Tonny BollmanMichael Treon Kehl, MD    Chief Complaint  Graham presents with  . Edema    legs     History of Present Illness: [name] Graham is a 81 y.o. male who presents for followup evaluation. [name] Graham has severe aortic stenosis but is not a candidate for surgical aortic valve replacement because of severe comorbidities medical conditions. [name] Graham has Parkinson's disease, generalized frailty, and history of pulmonary emboli maintained on chronic warfarin. [name] Graham also has dementia. [name] Graham was not a candidate for transcatheter valve replacement because his aortic valve annulus is too large for any of [name] available TAVR valves.  [name] Graham recently had problems with bladder outflow obstruction. [name] Graham underwent an office-based urologic procedure and things are improved now. [name] Graham had a foley catheter in for about 3 weeks but has been doing ok with a catheter since [name] procedure.   From a cardiac perspective, [name] Graham denies chest pain or shortness of breath. [name] Graham's been able to walk about 300 feet without symptoms of lightheadedness, dizziness, or chest pain. Just gained his strength back over [name] past few days, but now feels like [name] Graham is doing pretty well. [name] Graham does complain of tooth pain and [name] Graham would like to have a tooth pulled. [name] Graham's been concerned about whether this will be feasible on anticoagulation  Past Medical History  Diagnosis Date  . Pulmonary embolus     bilateral  . Hypothyroid   . Hypertension   . Chronic atrial fibrillation     INR therpeutic on Coumadin  . Dementia     mild, with hallucinations status post psychiatric evaluation during admission  . Kidney cyst, acquired   . A-fib   . CAD (coronary artery disease)     non-obstructive by cath 2006, no stents, ejection fraction  . Leukocytosis   . Tremor   . Parkinson disease   . Severe aortic stenosis   .  HYPERCHOLESTEROLEMIA  IIA 12/20/2008    Qualifier: Diagnosis of  By: [name] Graham   . Peripheral neuropathy   . Weakness of both legs 03/18/2012  . Macrocytic anemia 03/19/2012  . Urinary incontinence 03/28/2012  . Parkinson's disease dementia 01/20/2013  . Limited mobility   . Morbid obesity with BMI of 40.0-44.9, adult   . Chronic diastolic congestive heart failure     Past Surgical History  Procedure Laterality Date  . Mole removal      Current Outpatient Prescriptions  Medication Sig Dispense Refill  . acetaminophen (ARTHRITIS PAIN RELIEF) 650 MG CR tablet Take 650 mg by mouth. Take 2 tabs AM and 1 tab PM     . allopurinol (ZYLOPRIM) 300 MG tablet Take 300 mg by mouth daily.      Marland Kitchen. aspirin 81 MG chewable tablet Chew 81 mg by mouth daily.    . carbidopa-levodopa (SINEMET CR) 50-200 MG per tablet Take 1 tablet by mouth 3 (three) times daily. 90 tablet 0  . finasteride (PROSCAR) 5 MG tablet Take 5 mg by mouth daily.    . fluticasone (FLONASE) 50 MCG/ACT nasal spray Place 1 spray into both nostrils daily as needed for allergies.     . furosemide (LASIX) 80 MG tablet Take 1 tablet (80 mg total) by mouth daily. 30 tablet 1  . levothyroxine (SYNTHROID) 175 MCG tablet Take 175 mcg by mouth daily.      .Marland Kitchen  LORazepam (ATIVAN) 1 MG tablet Take 1 tablet (1 mg total) by mouth 2 (two) times daily as needed for anxiety. 30 tablet 0  . metoprolol (TOPROL XL) 50 MG 24 hr tablet Take 50 mg by mouth daily.      . Misc Natural Products (OSTEO BI-FLEX ADV DOUBLE ST PO) Take 2 tablets by mouth daily.     . Omega-3 Fatty Acids (FISH OIL) 1000 MG CAPS Take 1 capsule by mouth 2 (two) times daily.     . pantoprazole (PROTONIX) 40 MG tablet Take 40 mg by mouth daily.     . polyethylene glycol (MIRALAX / GLYCOLAX) packet Take 17 g by mouth every evening.    . potassium chloride SA (K-DUR,KLOR-CON) 20 MEQ tablet Take 20 mEq by mouth daily.    . pravastatin (PRAVACHOL) 20 MG tablet Take 20 mg by  mouth daily.     Marland Kitchen senna (SENOKOT) 8.6 MG tablet Take 3 tablets by mouth at bedtime.    . sucralfate (CARAFATE) 1 G tablet Take 1 g by mouth 2 (two) times daily.      . tamsulosin (FLOMAX) 0.4 MG CAPS capsule Take 0.4 mg by mouth daily after supper.     . warfarin (COUMADIN) 2.5 MG tablet Take 2.5-5 mg by mouth daily at 6 PM. Takes 2.5mg  (1 tablet) daily except  (2 tablets) on Monday and Friday     No current facility-administered medications for this visit.    Allergies:   Morphine and related and Nifedipine   Social History:  [name] Graham  reports that [name] Graham has quit smoking. His smoking use included Cigarettes. [name] Graham has a 24 pack-year smoking history. [name] Graham has never used smokeless tobacco. [name] Graham reports that [name] Graham does not drink alcohol or use illicit drugs.   Family History:  [name] Graham's family history includes Heart failure in his mother; Microcephaly in his father.    ROS:  Please see [name] history of present illness.  Otherwise, review of systems is positive for  Leg swelling, easy bruising,  Toothache, and constipation.  All other systems are reviewed and negative.    PHYSICAL EXAM: VS:  BP 102/62 mmHg  Pulse 94  Ht 6' (1.829 m)  Wt 294 lb 12.8 oz (133.72 kg)  BMI 39.97 kg/m2 , BMI Body mass index is 39.97 kg/(m^2). GEN: Well nourished, well developed, pleasant elderly man in no acute distress HEENT: normal Neck: no JVD, no masses. No carotid bruits Cardiac: irregularly irregular grade 3/6 harsh late peaking systolic murmur at [name] right upper sternal border Respiratory:  clear to auscultation bilaterally, normal work of breathing GI: soft, nontender, nondistended, + BS MS: no deformity or atrophy Ext:  1+ bilateral pretibial edema Skin: warm and dry, no rash Neuro:  Strength and sensation are intact Psych: euthymic mood, full affect  EKG:  EKG is ordered today. [name] ekg ordered today shows  Atrial fibrillation 94 bpm , nonspecific IVCD, left ventricular hypertrophy with  repolarization abnormality.  Recent Labs: 02/28/2014: TSH 5.528* 03/06/2014: ALT 7; Pro B Natriuretic peptide (BNP) 3135.0* 03/08/2014: BUN 24*; Creatinine 1.14; Hemoglobin 13.3; Platelets 131*; Potassium 3.8; Sodium 139   Lipid Panel  No results found for: CHOL, TRIG, HDL, CHOLHDL, VLDL, LDLCALC, LDLDIRECT    Wt Readings from Last 3 Encounters:  10/30/14 294 lb 12.8 oz (133.72 kg)  03/21/14 284 lb (128.822 kg)  03/08/14 279 lb 12.8 oz (126.916 kg)     Cardiac Studies Reviewed:  2-D echocardiogram 09/14/2013: - Left ventricle: Systolic function was normal.  [name] estimated ejection fraction was in [name] range of 55% to 60%. Wall motion was normal; there were no regional wall motion abnormalities. Doppler parameters are consistent with a reversible restrictive pattern, indicative of decreased left ventricular diastolic compliance and/or increased left atrial pressure (grade 3 diastolic dysfunction). - Aortic valve: Valve mobility was restricted. There was severe stenosis. Peak velocity: 457cm/s (S). Mean gradient: 52mm Hg (S). Valve area: 0.85cm^2(VTI). Valve area: 0.86cm^2 (Vmax). - Aorta: Aortic root dimension: 43mm (ED). - Mitral valve: Calcified annulus. Mild regurgitation. Valve area by continuity equation (using LVOT flow): 3.45cm^2. - Left atrium: [name] atrium was severely dilated. Anterior-posterior dimension: 64mm (2D). - Right ventricle: [name] cavity size was mildly dilated. Wall thickness was normal. - Right atrium: [name] atrium was mildly dilated. - Tricuspid valve: Moderate regurgitation. - Pulmonary arteries: Systolic pressure was mildly increased. PA peak pressure: 44mm Hg (S). Impressions:  - Aortic stenosis in 6/14 was severe with peak velocity ranging 4.3-4.31m/s (stable when compared to current study).  ASSESSMENT AND PLAN: 1.   Severe aortic stenosis: [name] Graham is not a candidate for TAVR because of his large valve annulus. His  annulus is to bed for [name] largest manufactured  Transcatheter heart valve bioprosthesis. [name] Graham is not a candidate for conventional surgical aortic valve replacement because of medical comorbidities. Will continue with medical therapy.  2. Atrial fibrillation, chronic: Continue anticoagulation with warfarin. Heart rate is controlled with metoprolol succinate.   3. Chronic diastolic heart failure: appears euvolemic on exam. [name] Graham tolerates furosemide 80 mg daily. No major changes.   With respect to his toothache and need for treatment of a broken tooth, [name] Graham would require Lovenox bridging. [name] Graham has a history of thrombotic complications off of warfarin. Will refer him to Dr Kristin Bruins at [facility].   Current medicines are reviewed with [name] Graham today.  [name] Graham does not have concerns regarding medicines.  [name] following changes have been made:  no change  Labs/ tests ordered today include:   Orders Placed This Encounter  Procedures  . Ambulatory referral to Dentistry  . EKG 12-Lead   Disposition:   FU 6 months  Signed, Tonny Bollman, MD  10/31/2014 6:53 AM    [facility] [address] [place], [place], [place]  [address] Phone: [phone]; Fax: [phone]

## 2014-10-31 ENCOUNTER — Telehealth: Payer: Self-pay | Admitting: Cardiovascular Disease

## 2014-10-31 NOTE — Telephone Encounter (Signed)
New message      Pt was seen yesterday.  Pt want a referral to a dentist at [facility]---([facility] at [facility] [address])

## 2014-10-31 NOTE — Telephone Encounter (Signed)
Left message on machine for [name] Graham to contact the office.   I went to the [employer] [employer] [facility] website and reviewed the information on how to become a patient.  The [name] Graham has to complete an application and then they having a drawing every month to see what [name] Graham's they will see. I will make [name] aware of this information.

## 2014-11-01 NOTE — Telephone Encounter (Signed)
I spoke with Renea EeEvelyn and made her aware of the process to become a pt at the [facility]. She said the pt has an appointment [facility] tomorrow at [facility].  I advised her that the pt needs to keep this appointment.

## 2014-11-01 NOTE — Telephone Encounter (Signed)
Left message on machine for pt's daughter to contact the office. I did advise that I am out of the office until next Tuesday.

## 2014-11-01 NOTE — Telephone Encounter (Signed)
Follow up  ° ° ° °Returning call back to nurse  °

## 2014-11-02 ENCOUNTER — Ambulatory Visit (HOSPITAL_COMMUNITY): Payer: Self-pay | Admitting: Dentistry

## 2014-11-02 ENCOUNTER — Encounter (HOSPITAL_COMMUNITY): Payer: Self-pay | Admitting: Dentistry

## 2014-11-02 VITALS — BP 127/71 | HR 102 | Temp 97.7°F

## 2014-11-02 DIAGNOSIS — K045 Chronic apical periodontitis: Secondary | ICD-10-CM

## 2014-11-02 DIAGNOSIS — K0530 Chronic periodontitis, unspecified: Secondary | ICD-10-CM

## 2014-11-02 DIAGNOSIS — Z7901 Long term (current) use of anticoagulants: Secondary | ICD-10-CM | POA: Diagnosis not present

## 2014-11-02 DIAGNOSIS — K029 Dental caries, unspecified: Secondary | ICD-10-CM

## 2014-11-02 DIAGNOSIS — K036 Deposits [accretions] on teeth: Secondary | ICD-10-CM

## 2014-11-02 DIAGNOSIS — I350 Nonrheumatic aortic (valve) stenosis: Secondary | ICD-10-CM

## 2014-11-02 DIAGNOSIS — K083 Retained dental root: Secondary | ICD-10-CM

## 2014-11-02 NOTE — Patient Instructions (Signed)
The patient is to follow up with Dr. Samuel GermanyGage concerning Lovenox bridging to allow for discontinuation of warfarin therapy prior to anticipated dental extraction procedures. Patient will then be scheduled for the operating room procedure in coordination with the Lovenox bridging.  Patient will be kept overnight for observation and then discontinued on warfarin therapy.  Dr. Kristin BruinsKulinski

## 2014-11-02 NOTE — Progress Notes (Signed)
11/02/2014  Patient Name:   [name] Graham Date of Birth:   10/31/1928 Medical Record Number: [id number]  BP 127/71 mmHg  Pulse 102  Temp(Src) 97.7 F (36.5 C) (Oral)  CC: Patient referred by Dr. Excell Seltzerooper for evaluation of poor dentition. HPI: [name] Graham is a 81 year old year old male that presents for periodic oral exam, dental radiographs, and discussion of treatment options. Patient was recently seen by Dr. Tonny BollmanMichael Cooper, cardiologist, that referred patient for evaluation of broken tooth and poor dentition. Patient was originally evaluated in February 2015 as part of a pre-heart valve surgery dental protocol.  The pateint did not proceed with aortic valve replacement at that time and did NOT wish to proceed with dental extractions at that time. The patient now presents for additional discussion of treatment options at this time.  The patient is complaining of the retained root segment in the area of #8.  This is been bothering him when he places his tongue in that area. Patient denies any significant toothache symptoms.   The patient denies having any swelling around the teeth.    Medical Hx Update:  Past Medical History  Diagnosis Date  . Pulmonary embolus     bilateral  . Hypothyroid   . Hypertension   . Chronic atrial fibrillation     INR therpeutic on Coumadin  . Dementia     mild, with hallucinations status post psychiatric evaluation during admission  . Kidney cyst, acquired   . A-fib   . CAD (coronary artery disease)     non-obstructive by cath 2006, no stents, ejection fraction  . Leukocytosis   . Tremor   . Parkinson disease   . Severe aortic stenosis   . HYPERCHOLESTEROLEMIA  IIA 12/20/2008    Qualifier: Diagnosis of  By: Riley KillStuckey, MD, Johny SaxFACC, Thomas David   . Peripheral neuropathy   . Weakness of both legs 03/18/2012  . Macrocytic anemia 03/19/2012  . Urinary incontinence 03/28/2012  . Parkinson's disease dementia 01/20/2013  . Limited mobility   . Morbid  obesity with BMI of 40.0-44.9, adult   . Chronic diastolic congestive heart failure   .  Past Surgical History  Procedure Laterality Date  . Mole removal      ALLERGIES/ADVERSE DRUG REACTIONS: Allergies  Allergen Reactions  . Morphine And Related Other (See Comments)    Goes Crazy  . Nifedipine Other (See Comments)    "makes me go crazy" procardia    MEDICATIONS: Current Outpatient Prescriptions  Medication Sig Dispense Refill  . acetaminophen (ARTHRITIS PAIN RELIEF) 650 MG CR tablet Take 650 mg by mouth. Take 2 tabs AM and 1 tab PM     . allopurinol (ZYLOPRIM) 300 MG tablet Take 300 mg by mouth daily.      Marland Kitchen. aspirin 81 MG chewable tablet Chew 81 mg by mouth daily.    . carbidopa-levodopa (SINEMET CR) 50-200 MG per tablet Take 1 tablet by mouth 3 (three) times daily. 90 tablet 0  . finasteride (PROSCAR) 5 MG tablet Take 5 mg by mouth daily.    . fluticasone (FLONASE) 50 MCG/ACT nasal spray Place 1 spray into both nostrils daily as needed for allergies.     . furosemide (LASIX) 80 MG tablet Take 1 tablet (80 mg total) by mouth daily. 30 tablet 1  . levothyroxine (SYNTHROID) 175 MCG tablet Take 175 mcg by mouth daily.      Marland Kitchen. LORazepam (ATIVAN) 1 MG tablet Take 1 tablet (1 mg total) by mouth 2 (two)  times daily as needed for anxiety. 30 tablet 0  . metoprolol (TOPROL XL) 50 MG 24 hr tablet Take 50 mg by mouth daily.      . Misc Natural Products (OSTEO BI-FLEX ADV DOUBLE ST PO) Take 2 tablets by mouth daily.     . Omega-3 Fatty Acids (FISH OIL) 1000 MG CAPS Take 1 capsule by mouth 2 (two) times daily.     . pantoprazole (PROTONIX) 40 MG tablet Take 40 mg by mouth daily.     . polyethylene glycol (MIRALAX / GLYCOLAX) packet Take 17 g by mouth every evening.    . potassium chloride SA (K-DUR,KLOR-CON) 20 MEQ tablet Take 20 mEq by mouth daily.    . pravastatin (PRAVACHOL) 20 MG tablet Take 20 mg by mouth daily.     Marland Kitchen senna (SENOKOT) 8.6 MG tablet Take 3 tablets by mouth at bedtime.     . sucralfate (CARAFATE) 1 G tablet Take 1 g by mouth 2 (two) times daily.      . tamsulosin (FLOMAX) 0.4 MG CAPS capsule Take 0.4 mg by mouth daily after supper.     . warfarin (COUMADIN) 2.5 MG tablet Take 2.5-5 mg by mouth daily at 6 PM. Takes 2.5mg  (1 tablet) daily except  (2 tablets) on Monday and Friday     No current facility-administered medications for this visit.   CC: Patient referred by Dr. Excell Seltzer for evaluation of poor dentition. HPI: [name] Graham is a 81 year old year old male that presents for periodic oral exam, dental radiographs, and discussion of treatment options. Patient was recently seen by Dr. Tonny Bollman, cardiologist, that referred patient for evaluation of broken tooth and poor dentition. Patient was originally evaluated in February 2015 as part of a pre-heart valve surgery dental protocol.  The pateint did not proceed with aortic valve replacement at that time and did NOT wish to proceed with dental extractions at that time. The patient now presents for additional discussion of treatment options at this time.  The patient is complaining of the retained root segment in the area of #8.  This is been bothering him when he places his tongue in that area. Patient denies any significant toothache symptoms.   The patient denies having any swelling around the teeth.    DENTAL EXAM: General: The patient is well-developed, obese male in no acute distress. Vitals: BP 127/71 mmHg  Pulse 102  Temp(Src) 97.7 F (36.5 C) (Oral) Extraoral Exam: There is no palpable submandibular lymphadenopathy. The patient denies acute TMJ symptoms. Intraoral  Exam: The patient has normal saliva. There is no evidence of intraoral abscess formation. Dentition: Patient is missing tooth numbers 1, 2, 3, 4, 10, 13, 14, 15, 16, 17, 19, 20, 28, 29, and 32. There is retained root segment in the area of #8. Periodontal: Chronic periodontitis with plaque and calculus accumulations, gingival  recession, and tooth mobility.  Dental caries: Patient has multiple dental caries and suboptimal dental restorations as per dental charting form. Patient has significant incisal and occlusal attrition/erosion. Patient has extensive caries involvement of tooth numbers 18 and 30 with probable pulpal involvement. Multiple flexure lesions are noted. Endodontic:   The currently denies acute toothache symptoms.  C&B:  patient has multiple crown restorations. There are recurrent caries associated with tooth #30. Prosthodontic: Patient denies having partial dentures. Occlusion:   patient has a poor occlusal scheme secondary to multiple missing teeth, retained root segments, supra-eruption and drifting of the unopposed teeth into the edentulous areas, and lack  of replacement of missing teeth with dental prostheses. Radiographic IntInterpretation: An orthopantogram was taken today. There are multiple missing teeth. There is a retained root segments the area #8. Extensive dental caries are noted. There is supra-eruption and drifting of the unopposed teeth into the edentulous areas.Multiple crown restorations are noted.  Assessments: 1. Severe aortic stenosis 2. Chronic anticoagulation with warfarin therapy 3. Need for Lovenox bridging prior to invasive dental procedures with discontinuation of warfarin therapy 4. Retained root segment #8 5. Extensive dental caries associated with tooth numbers 18 and 30. 6. Chronic periodontitis with bone loss 7. Plaque and calculus accumulations 8. Gingival recession 9. Tooth mobility 10. Multiple missing teeth 11. Supra-eruption and drifting of the unopposed teeth into the edentulous areas 12. Malocclusion 13. Incisal attrition/erosion 14. Risk for bleeding with invasive dental procedures due to warfarin therapy and anticipated Lovenox bridging 15. Significant cardiovascular compromise with the risk for complications up to and including death with anticipated invasive  dental procedures  Plan:  1. I discussed the risks, benefits, competitions various treatment options with the patient relationship to his medical and dental conditions, current warfarin therapy, and overall cardiovascular compromise.   We discussed various treatment options to include no treatment, multiple extractions with alveoloplasty, periodontal therapy, dental restorations, root canal therapy, chronic bridge therapy, implant therapy, and replacement missing teeth is indicated after adequate healing. The patient is currently interested in proceeding with extraction of tooth numbers 8, 18, and 30 with alveoloplasty and gross debridement of remaining dentition the operating room. After discussion with Dr. Excell Seltzer, it was determined that patient would require Lovenox bridging while warfarin therapy was discontinued, and then patient would be started on warfarin therapy again after dental procedures without additional Lovenox dosing. Patient will be kept overnight for 23 hour observation to the dental procedures. Dr. Excell Seltzer indicated that Lovenox bridging could be coordinated with the [facility] Coumadin clinic or with his primary physician, Dr. Samuel Germany.  Dr. Samuel Germany was contacted and he will see the patient in follow-up to discuss process of Lovenox bridging. Patient will then be scheduled for operating room procedure in coordination with the anticipated Lovenox bridging.   2. Discussion of findings with medical team members as indicated.   Charlynne Pander, DDS

## 2014-11-07 ENCOUNTER — Encounter (HOSPITAL_COMMUNITY): Admission: RE | Payer: Self-pay | Source: Ambulatory Visit

## 2014-11-07 ENCOUNTER — Ambulatory Visit (HOSPITAL_COMMUNITY): Admission: RE | Admit: 2014-11-07 | Payer: Medicare Other | Source: Ambulatory Visit | Admitting: Dentistry

## 2014-11-07 SURGERY — MULTIPLE EXTRACTION WITH ALVEOLOPLASTY
Anesthesia: Monitor Anesthesia Care

## 2014-11-14 ENCOUNTER — Other Ambulatory Visit (HOSPITAL_COMMUNITY): Payer: Self-pay | Admitting: Dentistry

## 2014-11-20 ENCOUNTER — Other Ambulatory Visit (HOSPITAL_COMMUNITY): Payer: Self-pay | Admitting: *Deleted

## 2014-11-20 NOTE — Pre-Procedure Instructions (Addendum)
[name] Graham  11/20/2014   Your procedure is scheduled on:  Thursday, November 22, 2014    Report to [facility] Entrance "A" Admitting Office at 8:00 AM.   Call this number if you have problems the morning of surgery: [phone]   Remember:   Do not eat food or drink liquids after midnight tonight.   Take these medicines the morning of surgery with A SIP OF WATER: allopurinol,Carbidopa-levodopa (Sinemet CR), Levothyroxine (Synthroid),, Pantoprazole (Protonix), Lorazepam (Ativan) - if needed, Flonase - if needed.      Bring metropolol with you to take after we check blood pressure           STOP all herbel meds, nsaids (aleve,naproxen,advil,ibuprofen) now including vitamins, osteo biflex, fish oil,     STOP coumadin per dr   Oneita Hurt tonite but not am of surgery unless we call you.   Do not wear jewelry.  Do not wear lotions, powders, or cologne. You may wear deodorant.  Men may shave face and neck.  Do not bring valuables to the hospital.  [facility] is not responsible                  for any belongings or valuables.               Contacts, dentures or bridgework may not be worn into surgery.  Leave suitcase in the car. After surgery it may be brought to your room.  For patients admitted to the hospital, discharge time is determined by your                treatment team.               Special Instructions:  Special Instructions: [facility] - Preparing for Surgery  Before surgery, you can play an important role.  Because skin is not sterile, your skin needs to be as free of germs as possible.  You can reduce the number of germs on you skin by washing with CHG (chlorahexidine gluconate) soap before surgery.  CHG is an antiseptic cleaner which kills germs and bonds with the skin to continue killing germs even after washing.  Please DO NOT use if you have an allergy to CHG or antibacterial soaps.  If your skin becomes reddened/irritated stop using the CHG and inform your  nurse when you arrive at Short Stay.  Do not shave (including legs and underarms) for at least 48 hours prior to the first CHG shower.  You may shave your face.  Please follow these instructions carefully:   1.  Shower with CHG Soap the night before surgery and the morning of Surgery.  2.  If you choose to wash your hair, wash your hair first as usual with your normal shampoo.  3.  After you shampoo, rinse your hair and body thoroughly to remove the Shampoo.  4.  Use CHG as you would any other liquid soap.  You can apply chg directly  to the skin and wash gently with scrungie or a clean washcloth.  5.  Apply the CHG Soap to your body ONLY FROM THE NECK DOWN.  Do not use on open wounds or open sores.  Avoid contact with your eyes ears, mouth and genitals (private parts).  Wash genitals (private parts)       with your normal soap.  6.  Wash thoroughly, paying special attention to the area where your surgery will be performed.  7.  Thoroughly rinse your body  with warm water from the neck down.  8.  DO NOT shower/wash with your normal soap after using and rinsing off the CHG Soap.  9.  Pat yourself dry with a clean towel.            10.  Wear clean pajamas.            11.  Place clean sheets on your bed the night of your first shower and do not sleep with pets.  Day of Surgery  Do not apply any lotions/deodorants the morning of surgery.  Please wear clean clothes to the hospital/surgery center.    Please read over the following fact sheets that you were given: Pain Booklet, Coughing and Deep Breathing and Surgical Site Infection Prevention

## 2014-11-21 ENCOUNTER — Encounter (HOSPITAL_COMMUNITY): Payer: Self-pay

## 2014-11-21 ENCOUNTER — Encounter (HOSPITAL_COMMUNITY)
Admission: RE | Admit: 2014-11-21 | Discharge: 2014-11-21 | Disposition: A | Discharge status: Home / Self Care | Payer: Medicare Other | Source: Ambulatory Visit | Attending: Dentistry | Admitting: Dentistry

## 2014-11-21 DIAGNOSIS — E780 Pure hypercholesterolemia: Secondary | ICD-10-CM | POA: Diagnosis not present

## 2014-11-21 DIAGNOSIS — R262 Difficulty in walking, not elsewhere classified: Secondary | ICD-10-CM | POA: Diagnosis not present

## 2014-11-21 DIAGNOSIS — R531 Weakness: Secondary | ICD-10-CM | POA: Diagnosis not present

## 2014-11-21 DIAGNOSIS — I5032 Chronic diastolic (congestive) heart failure: Secondary | ICD-10-CM | POA: Diagnosis not present

## 2014-11-21 DIAGNOSIS — K045 Chronic apical periodontitis: Secondary | ICD-10-CM | POA: Diagnosis not present

## 2014-11-21 DIAGNOSIS — I2510 Atherosclerotic heart disease of native coronary artery without angina pectoris: Secondary | ICD-10-CM | POA: Diagnosis not present

## 2014-11-21 DIAGNOSIS — Z87891 Personal history of nicotine dependence: Secondary | ICD-10-CM | POA: Diagnosis not present

## 2014-11-21 DIAGNOSIS — Z7901 Long term (current) use of anticoagulants: Secondary | ICD-10-CM | POA: Diagnosis not present

## 2014-11-21 DIAGNOSIS — K088 Other specified disorders of teeth and supporting structures: Secondary | ICD-10-CM | POA: Diagnosis not present

## 2014-11-21 DIAGNOSIS — K029 Dental caries, unspecified: Secondary | ICD-10-CM | POA: Diagnosis not present

## 2014-11-21 DIAGNOSIS — I350 Nonrheumatic aortic (valve) stenosis: Secondary | ICD-10-CM | POA: Diagnosis not present

## 2014-11-21 DIAGNOSIS — E039 Hypothyroidism, unspecified: Secondary | ICD-10-CM | POA: Diagnosis not present

## 2014-11-21 DIAGNOSIS — F0390 Unspecified dementia without behavioral disturbance: Secondary | ICD-10-CM | POA: Diagnosis not present

## 2014-11-21 DIAGNOSIS — K083 Retained dental root: Secondary | ICD-10-CM | POA: Diagnosis not present

## 2014-11-21 DIAGNOSIS — I482 Chronic atrial fibrillation: Secondary | ICD-10-CM | POA: Diagnosis not present

## 2014-11-21 DIAGNOSIS — I10 Essential (primary) hypertension: Secondary | ICD-10-CM | POA: Diagnosis not present

## 2014-11-21 DIAGNOSIS — Z6841 Body Mass Index (BMI) 40.0 and over, adult: Secondary | ICD-10-CM | POA: Diagnosis not present

## 2014-11-21 DIAGNOSIS — G20 Parkinson's disease: Secondary | ICD-10-CM | POA: Diagnosis not present

## 2014-11-21 DIAGNOSIS — Z7982 Long term (current) use of aspirin: Secondary | ICD-10-CM | POA: Diagnosis not present

## 2014-11-21 HISTORY — DX: Unspecified osteoarthritis, unspecified site: M19.90

## 2014-11-21 HISTORY — DX: Peripheral vascular disease, unspecified: I73.9

## 2014-11-21 HISTORY — DX: Angina pectoris, unspecified: I20.9

## 2014-11-21 HISTORY — DX: Cardiac murmur, unspecified: R01.1

## 2014-11-21 HISTORY — DX: Pneumonia, unspecified organism: J18.9

## 2014-11-21 LAB — CBC
HCT: 36.2 % — ABNORMAL LOW (ref 39.0–52.0)
Hemoglobin: 12 g/dL — ABNORMAL LOW (ref 13.0–17.0)
MCH: 34.5 pg — ABNORMAL HIGH (ref 26.0–34.0)
MCHC: 33.1 g/dL (ref 30.0–36.0)
MCV: 104 fL — ABNORMAL HIGH (ref 78.0–100.0)
PLATELETS: 141 10*3/uL — AB (ref 150–400)
RBC: 3.48 MIL/uL — AB (ref 4.22–5.81)
RDW: 15.1 % (ref 11.5–15.5)
WBC: 4.8 10*3/uL (ref 4.0–10.5)

## 2014-11-21 LAB — BASIC METABOLIC PANEL
Anion gap: 7 (ref 5–15)
BUN: 20 mg/dL (ref 6–23)
CHLORIDE: 102 mmol/L (ref 96–112)
CO2: 27 mmol/L (ref 19–32)
CREATININE: 1.31 mg/dL (ref 0.50–1.35)
Calcium: 9 mg/dL (ref 8.4–10.5)
GFR calc non Af Amer: 49 mL/min — ABNORMAL LOW (ref >90)
GFR, EST AFRICAN AMERICAN: 56 mL/min — AB (ref >90)
GLUCOSE: 127 mg/dL — AB (ref 70–99)
Potassium: 3.7 mmol/L (ref 3.5–5.1)
Sodium: 136 mmol/L (ref 135–145)

## 2014-11-21 LAB — PROTIME-INR
INR: 1.17 (ref 0.00–1.49)
Prothrombin Time: 15.1 seconds (ref 11.6–15.2)

## 2014-11-21 LAB — APTT: APTT: 48 s — AB (ref 24–37)

## 2014-11-21 MED ORDER — DEXTROSE 5 % IV SOLN
3.0000 g | Freq: Once | INTRAVENOUS | Status: AC
  Administered 2014-11-22: 3 g via INTRAVENOUS
  Filled 2014-11-21: qty 3000

## 2014-11-21 NOTE — Progress Notes (Addendum)
Anesthesia Chart Review:  Patient is a 81 year old male scheduled for multiple teeth extraction with alveoloplasty and gross debridement of teeth tomorrow by Dr. Kristin BruinsKulinski.  He was referred by his cardiologist Dr. Excell Seltzerooper.  History include severe AS (not a candidate for TAVR or open AVR--see 10/30/14 cardiology note for details), Parkinson's disease, chronic afib, chronic diastolic CHF, PE '09, HTN, hypothyroidism, macrocytic anemia, former smoker. BMI is consistent with morbid obesity.  PCP is Dr. Samuel GermanyGage.    Med list reviewed and includes ASA, Lovenox bridge while off Coumadin, Lasix, KCl, Toprol XL, Flomax, levothyroxine, Sinemet. (Of note, patient reported episodes of dizziness. BP was 97/57.  He had taken Lasix this morning and thought he had taken Toprol as well.  BP rechecked prior to leaving PAT and was 105/75. HR 82. He was advised to communicate with Dr. Earmon Phoenixooper's office.  I also instructed the PAT RN to have him hold Toprol in the morning until we are able to check his BP.  His Lasix will already be held per anesthesia guidelines.  If his SBP is > 100 then his anesthesiologist can determine if patient can take Toprol or be given intravenous b-blocker prior to his procedure.)   09/14/13 Echo: Study Conclusions - Left ventricle: Systolic function was normal. The estimated ejection fraction was in the range of 55% to 60%. Wall motion was normal; there were no regional wall motion abnormalities. Doppler parameters are consistent with a reversible restrictive pattern, indicative of decreased left ventricular diastolic compliance and/or increased left atrial pressure (grade 3 diastolic dysfunction). - Aortic valve: Valve mobility was restricted. There was severe stenosis. Peak velocity: 457cm/s (S). Mean gradient: 52mm Hg (S). Valve area: 0.85cm^2(VTI). Valve area: 0.86cm^2 (Vmax). - Aorta: Aortic root dimension: 43mm (ED). - Mitral valve: Calcified annulus. Mild regurgitation.  Valve area by continuity equation (using LVOT flow): 3.45cm^2. - Left atrium: The atrium was severely dilated. Anterior-posterior dimension: 64mm (2D). - Right ventricle: The cavity size was mildly dilated. Wall thickness was normal. - Right atrium: The atrium was mildly dilated. - Tricuspid valve: Moderate regurgitation. - Pulmonary arteries: Systolic pressure was mildly increased. PA peak pressure: 44mm Hg (S). Impressions: - Aortic stenosis in 6/14 was severe with peak velocity ranging 4.3-4.692m/s (stable when compared to current study).  Mild CAD by 05/11/05 cath. I don't see any more recent cath in Epic, although he was evaluated at [facility] for possible TAVR ~ 2012-2014 and may have had additional testing then.  (Notes scanned just mention echo and CTA.)  10/30/14 EKG as interpreted by Dr. Excell Seltzerooper: Atrial fibrillation 94 bpm , nonspecific IVCD, left ventricular hypertrophy with repolarization abnormality.  Preoperative labs noted.  PTT 48, but on Lovenox this morning.  I spoke with Dr. Kristin BruinsKulinski to clarify Lovenox instructions.  He spoke with Dr. Excell Seltzerooper and agreed that patient's last Lovenox dose should be this morning--no Lovenox this PM or tomorrow.  I called and notified patient's daughter Dionicio Stallvelyn Wile [phone]) of Lovenox instructions. I also notified Dr. Kristin BruinsKulinski that I had instructed patient to hold Toprol in the morning until we were able to recheck his BP.   Per Dr. Kristin BruinsKulinski.  Planned MAC for this procedure--he is removing three teeth.  Patient will be admitted overnight.    Further evaluation by his assigned anesthesiologist on the day of surgery.  Velna Ochsllison Demere Dotzler, PA-C [facility]hesiology Phone [phone] 11/21/2014 1:14 PM

## 2014-11-21 NOTE — Progress Notes (Signed)
   11/21/14 1020  OBSTRUCTIVE SLEEP APNEA  Have you ever been diagnosed with sleep apnea through a sleep study? No  Do you snore loudly (loud enough to be heard through closed doors)?  0  Do you often feel tired, fatigued, or sleepy during the daytime? 0  Has anyone observed you stop breathing during your sleep? 0  Do you have, or are you being treated for high blood pressure? 1  BMI more than 35 kg/m2? 1  Age over 79 years old? 1  Neck circumference greater than 40 cm/16 inches? 0 (17.5)  Gender: 1  Obstructive Sleep Apnea Score 4

## 2014-11-21 NOTE — Progress Notes (Addendum)
[name] Called dr Lynnell Catalankulinksi re: lovenox in am before surgey. Message left. Patient instructed to hold lovenox in am unless we call and inst him to take it.  bp low this am. Patient feels a little light headed- started yesterday am. bp repeated 1) 97/57  2)105/75      inst to hold metropolol tomorrow and bring with him until after we check bp- [facility].

## 2014-11-22 ENCOUNTER — Encounter (HOSPITAL_COMMUNITY)
Admission: RE | Disposition: A | Discharge status: Home / Self Care | Payer: Self-pay | Source: Ambulatory Visit | Attending: Cardiovascular Disease

## 2014-11-22 ENCOUNTER — Observation Stay (HOSPITAL_COMMUNITY)
Admission: RE | Admit: 2014-11-22 | Discharge: 2014-11-23 | Disposition: A | Discharge status: Home / Self Care | Payer: Medicare Other | Source: Ambulatory Visit | Attending: Cardiovascular Disease | Admitting: Cardiovascular Disease

## 2014-11-22 ENCOUNTER — Encounter (HOSPITAL_COMMUNITY): Payer: Self-pay | Admitting: *Deleted

## 2014-11-22 ENCOUNTER — Ambulatory Visit (HOSPITAL_COMMUNITY): Payer: Medicare Other | Admitting: Vascular Surgery

## 2014-11-22 ENCOUNTER — Ambulatory Visit (HOSPITAL_COMMUNITY): Payer: Medicare Other | Admitting: Anesthesiology

## 2014-11-22 DIAGNOSIS — K08409 Partial loss of teeth, unspecified cause, unspecified class: Secondary | ICD-10-CM

## 2014-11-22 DIAGNOSIS — E039 Hypothyroidism, unspecified: Secondary | ICD-10-CM | POA: Insufficient documentation

## 2014-11-22 DIAGNOSIS — I350 Nonrheumatic aortic (valve) stenosis: Secondary | ICD-10-CM

## 2014-11-22 DIAGNOSIS — I4891 Unspecified atrial fibrillation: Secondary | ICD-10-CM | POA: Diagnosis present

## 2014-11-22 DIAGNOSIS — I482 Chronic atrial fibrillation: Secondary | ICD-10-CM | POA: Diagnosis not present

## 2014-11-22 DIAGNOSIS — R531 Weakness: Secondary | ICD-10-CM | POA: Insufficient documentation

## 2014-11-22 DIAGNOSIS — K045 Chronic apical periodontitis: Secondary | ICD-10-CM | POA: Diagnosis not present

## 2014-11-22 DIAGNOSIS — Z7901 Long term (current) use of anticoagulants: Secondary | ICD-10-CM | POA: Insufficient documentation

## 2014-11-22 DIAGNOSIS — K036 Deposits [accretions] on teeth: Secondary | ICD-10-CM | POA: Diagnosis present

## 2014-11-22 DIAGNOSIS — K088 Other specified disorders of teeth and supporting structures: Secondary | ICD-10-CM | POA: Insufficient documentation

## 2014-11-22 DIAGNOSIS — K029 Dental caries, unspecified: Secondary | ICD-10-CM

## 2014-11-22 DIAGNOSIS — R262 Difficulty in walking, not elsewhere classified: Secondary | ICD-10-CM | POA: Insufficient documentation

## 2014-11-22 DIAGNOSIS — E7800 Pure hypercholesterolemia, unspecified: Secondary | ICD-10-CM | POA: Diagnosis present

## 2014-11-22 DIAGNOSIS — Z6841 Body Mass Index (BMI) 40.0 and over, adult: Secondary | ICD-10-CM | POA: Insufficient documentation

## 2014-11-22 DIAGNOSIS — K0530 Chronic periodontitis, unspecified: Secondary | ICD-10-CM | POA: Diagnosis present

## 2014-11-22 DIAGNOSIS — K083 Retained dental root: Secondary | ICD-10-CM

## 2014-11-22 DIAGNOSIS — F0390 Unspecified dementia without behavioral disturbance: Secondary | ICD-10-CM | POA: Insufficient documentation

## 2014-11-22 DIAGNOSIS — I5032 Chronic diastolic (congestive) heart failure: Secondary | ICD-10-CM | POA: Diagnosis not present

## 2014-11-22 DIAGNOSIS — Z7982 Long term (current) use of aspirin: Secondary | ICD-10-CM | POA: Insufficient documentation

## 2014-11-22 DIAGNOSIS — G20 Parkinson's disease: Secondary | ICD-10-CM | POA: Insufficient documentation

## 2014-11-22 DIAGNOSIS — I2510 Atherosclerotic heart disease of native coronary artery without angina pectoris: Secondary | ICD-10-CM | POA: Insufficient documentation

## 2014-11-22 DIAGNOSIS — Z87891 Personal history of nicotine dependence: Secondary | ICD-10-CM | POA: Insufficient documentation

## 2014-11-22 DIAGNOSIS — I10 Essential (primary) hypertension: Secondary | ICD-10-CM | POA: Insufficient documentation

## 2014-11-22 HISTORY — DX: Personal history of other diseases of the digestive system: Z87.19

## 2014-11-22 HISTORY — DX: Acute embolism and thrombosis of unspecified deep veins of unspecified lower extremity: I82.409

## 2014-11-22 HISTORY — DX: Personal history of peptic ulcer disease: Z87.11

## 2014-11-22 HISTORY — PX: MULTIPLE EXTRACTIONS WITH ALVEOLOPLASTY: SHX5342

## 2014-11-22 HISTORY — DX: Type 2 diabetes mellitus without complications: E11.9

## 2014-11-22 HISTORY — DX: Unspecified chronic bronchitis: J42

## 2014-11-22 LAB — GLUCOSE, CAPILLARY
GLUCOSE-CAPILLARY: 109 mg/dL — AB (ref 70–99)
Glucose-Capillary: 98 mg/dL (ref 70–99)
Glucose-Capillary: 145 mg/dL — ABNORMAL HIGH (ref 70–99)
Glucose-Capillary: 112 mg/dL — ABNORMAL HIGH (ref 70–99)

## 2014-11-22 SURGERY — MULTIPLE EXTRACTION WITH ALVEOLOPLASTY
Anesthesia: Monitor Anesthesia Care | Site: Mouth

## 2014-11-22 MED ORDER — CARBIDOPA-LEVODOPA ER 50-200 MG PO TBCR
1.0000 | EXTENDED_RELEASE_TABLET | Freq: Three times a day (TID) | ORAL | Status: DC
  Administered 2014-11-22 – 2014-11-23 (×3): 1 via ORAL
  Filled 2014-11-22 (×5): qty 1

## 2014-11-22 MED ORDER — BUPIVACAINE-EPINEPHRINE (PF) 0.5% -1:200000 IJ SOLN
INTRAMUSCULAR | Status: AC
  Filled 2014-11-22: qty 3.6

## 2014-11-22 MED ORDER — LACTATED RINGERS IV SOLN
INTRAVENOUS | Status: DC
  Administered 2014-11-22: 11:00:00 via INTRAVENOUS
  Administered 2014-11-22: 50 mL/h via INTRAVENOUS

## 2014-11-22 MED ORDER — ACETAMINOPHEN 500 MG PO TABS
500.0000 mg | ORAL_TABLET | Freq: Every day | ORAL | Status: DC
  Administered 2014-11-22: 500 mg via ORAL
  Filled 2014-11-22 (×2): qty 1

## 2014-11-22 MED ORDER — AMINOCAPROIC ACID SOLUTION 5% (50 MG/ML)
10.0000 mL | ORAL | Status: DC
  Administered 2014-11-22: 3 mL via ORAL
  Administered 2014-11-22: 10 mL via ORAL
  Filled 2014-11-22: qty 100

## 2014-11-22 MED ORDER — LACTATED RINGERS IV SOLN
INTRAVENOUS | Status: DC
  Administered 2014-11-22: 10 mL/h via INTRAVENOUS

## 2014-11-22 MED ORDER — FENTANYL CITRATE (PF) 100 MCG/2ML IJ SOLN: 25.0000 ug | INTRAMUSCULAR | Status: DC | PRN

## 2014-11-22 MED ORDER — WARFARIN SODIUM 5 MG PO TABS
5.0000 mg | ORAL_TABLET | Freq: Once | ORAL | Status: AC
  Administered 2014-11-22: 5 mg via ORAL
  Filled 2014-11-22: qty 1

## 2014-11-22 MED ORDER — OXYCODONE-ACETAMINOPHEN 5-325 MG PO TABS
1.0000 | ORAL_TABLET | ORAL | Status: DC | PRN
  Administered 2014-11-22 (×2): 1 via ORAL
  Administered 2014-11-23: 2 via ORAL
  Filled 2014-11-22: qty 2
  Filled 2014-11-22 (×2): qty 1

## 2014-11-22 MED ORDER — METOPROLOL SUCCINATE ER 50 MG PO TB24
50.0000 mg | ORAL_TABLET | Freq: Every day | ORAL | Status: DC
  Administered 2014-11-23: 50 mg via ORAL
  Filled 2014-11-22: qty 1

## 2014-11-22 MED ORDER — MIDAZOLAM HCL 2 MG/2ML IJ SOLN
INTRAMUSCULAR | Status: AC
  Filled 2014-11-22: qty 2

## 2014-11-22 MED ORDER — INSULIN ASPART 100 UNIT/ML ~~LOC~~ SOLN: 0.0000 [IU] | Freq: Three times a day (TID) | SUBCUTANEOUS | Status: DC

## 2014-11-22 MED ORDER — FINASTERIDE 5 MG PO TABS
5.0000 mg | ORAL_TABLET | Freq: Every day | ORAL | Status: DC
  Administered 2014-11-22 – 2014-11-23 (×2): 5 mg via ORAL
  Filled 2014-11-22 (×2): qty 1

## 2014-11-22 MED ORDER — PANTOPRAZOLE SODIUM 40 MG PO TBEC
40.0000 mg | DELAYED_RELEASE_TABLET | Freq: Every day | ORAL | Status: DC
  Administered 2014-11-23: 40 mg via ORAL
  Filled 2014-11-22: qty 1

## 2014-11-22 MED ORDER — OXYMETAZOLINE HCL 0.05 % NA SOLN
NASAL | Status: AC
  Filled 2014-11-22: qty 15

## 2014-11-22 MED ORDER — ARTIFICIAL TEARS OP OINT
TOPICAL_OINTMENT | OPHTHALMIC | Status: AC
  Filled 2014-11-22: qty 3.5

## 2014-11-22 MED ORDER — ROCURONIUM BROMIDE 50 MG/5ML IV SOLN
INTRAVENOUS | Status: AC
  Filled 2014-11-22: qty 1

## 2014-11-22 MED ORDER — ONDANSETRON HCL 4 MG/2ML IJ SOLN: 4.0000 mg | Freq: Once | INTRAMUSCULAR | Status: DC | PRN

## 2014-11-22 MED ORDER — ALLOPURINOL 300 MG PO TABS
300.0000 mg | ORAL_TABLET | Freq: Every day | ORAL | Status: DC
  Administered 2014-11-23: 300 mg via ORAL
  Filled 2014-11-22: qty 1

## 2014-11-22 MED ORDER — METOPROLOL TARTRATE 1 MG/ML IV SOLN
INTRAVENOUS | Status: DC | PRN
  Administered 2014-11-22 (×2): 2 mg via INTRAVENOUS

## 2014-11-22 MED ORDER — FUROSEMIDE 80 MG PO TABS
80.0000 mg | ORAL_TABLET | Freq: Every day | ORAL | Status: DC
  Administered 2014-11-22 – 2014-11-23 (×2): 80 mg via ORAL
  Filled 2014-11-22 (×2): qty 1

## 2014-11-22 MED ORDER — GLYCOPYRROLATE 0.2 MG/ML IJ SOLN
INTRAMUSCULAR | Status: AC
  Filled 2014-11-22: qty 3

## 2014-11-22 MED ORDER — PROPOFOL 10 MG/ML IV BOLUS
INTRAVENOUS | Status: AC
  Filled 2014-11-22: qty 20

## 2014-11-22 MED ORDER — WARFARIN - PHARMACIST DOSING INPATIENT: Freq: Every day | Status: DC

## 2014-11-22 MED ORDER — NEOSTIGMINE METHYLSULFATE 10 MG/10ML IV SOLN
INTRAVENOUS | Status: AC
  Filled 2014-11-22: qty 1

## 2014-11-22 MED ORDER — ACETAMINOPHEN ER 650 MG PO TBCR: 650.0000 mg | EXTENDED_RELEASE_TABLET | Freq: Three times a day (TID) | ORAL | Status: DC

## 2014-11-22 MED ORDER — LEVOTHYROXINE SODIUM 175 MCG PO TABS
175.0000 ug | ORAL_TABLET | Freq: Every day | ORAL | Status: DC
  Administered 2014-11-23: 175 ug via ORAL
  Filled 2014-11-22 (×2): qty 1

## 2014-11-22 MED ORDER — POLYETHYLENE GLYCOL 3350 17 G PO PACK
17.0000 g | PACK | Freq: Every evening | ORAL | Status: DC
  Administered 2014-11-22: 17 g via ORAL
  Filled 2014-11-22 (×2): qty 1

## 2014-11-22 MED ORDER — LIDOCAINE-EPINEPHRINE 2 %-1:100000 IJ SOLN
INTRAMUSCULAR | Status: DC | PRN
  Administered 2014-11-22: 3.4 mL

## 2014-11-22 MED ORDER — POTASSIUM CHLORIDE CRYS ER 20 MEQ PO TBCR
20.0000 meq | EXTENDED_RELEASE_TABLET | Freq: Every day | ORAL | Status: DC
  Administered 2014-11-23: 20 meq via ORAL
  Filled 2014-11-22: qty 1

## 2014-11-22 MED ORDER — 0.9 % SODIUM CHLORIDE (POUR BTL) OPTIME
TOPICAL | Status: DC | PRN
  Administered 2014-11-22: 1000 mL

## 2014-11-22 MED ORDER — BUPIVACAINE-EPINEPHRINE 0.5% -1:200000 IJ SOLN
INTRAMUSCULAR | Status: DC | PRN
  Administered 2014-11-22: 5.1 mL

## 2014-11-22 MED ORDER — FENTANYL CITRATE (PF) 250 MCG/5ML IJ SOLN
INTRAMUSCULAR | Status: AC
Start: 2014-11-22 — End: 2014-11-22
  Filled 2014-11-22: qty 5

## 2014-11-22 MED ORDER — AMINOCAPROIC ACID SOLUTION 5% (50 MG/ML)
10.0000 mL | ORAL | Status: AC
  Administered 2014-11-22 – 2014-11-23 (×9): 10 mL via ORAL
  Filled 2014-11-22: qty 100

## 2014-11-22 MED ORDER — FLUTICASONE PROPIONATE 50 MCG/ACT NA SUSP: 1.0000 | Freq: Every day | NASAL | Status: DC | PRN

## 2014-11-22 MED ORDER — SENNA 8.6 MG PO TABS
3.0000 | ORAL_TABLET | Freq: Every day | ORAL | Status: DC
Start: 2014-11-22 — End: 2014-11-23
  Administered 2014-11-22: 25.8 mg via ORAL
  Filled 2014-11-22 (×2): qty 3

## 2014-11-22 MED ORDER — ONDANSETRON HCL 4 MG/2ML IJ SOLN
INTRAMUSCULAR | Status: AC
  Filled 2014-11-22: qty 2

## 2014-11-22 MED ORDER — LIDOCAINE HCL (CARDIAC) 20 MG/ML IV SOLN
INTRAVENOUS | Status: AC
  Filled 2014-11-22: qty 5

## 2014-11-22 MED ORDER — LORAZEPAM 1 MG PO TABS
1.0000 mg | ORAL_TABLET | Freq: Two times a day (BID) | ORAL | Status: DC | PRN
  Administered 2014-11-22: 1 mg via ORAL
  Filled 2014-11-22: qty 1

## 2014-11-22 MED ORDER — SUCRALFATE 1 G PO TABS
1.0000 g | ORAL_TABLET | Freq: Two times a day (BID) | ORAL | Status: DC
  Administered 2014-11-22 – 2014-11-23 (×2): 1 g via ORAL
  Filled 2014-11-22 (×4): qty 1

## 2014-11-22 MED ORDER — ACETAMINOPHEN 500 MG PO TABS
1000.0000 mg | ORAL_TABLET | Freq: Every day | ORAL | Status: DC
  Administered 2014-11-23: 1000 mg via ORAL
  Filled 2014-11-22 (×2): qty 2

## 2014-11-22 MED ORDER — PRAVASTATIN SODIUM 20 MG PO TABS
20.0000 mg | ORAL_TABLET | Freq: Every day | ORAL | Status: DC
  Administered 2014-11-23: 20 mg via ORAL
  Filled 2014-11-22: qty 1

## 2014-11-22 MED ORDER — METOPROLOL TARTRATE 1 MG/ML IV SOLN
INTRAVENOUS | Status: AC
  Filled 2014-11-22: qty 5

## 2014-11-22 MED ORDER — AMINOCAPROIC ACID SOLUTION 5% (50 MG/ML)
10.0000 mL | ORAL | Status: DC | PRN
  Filled 2014-11-22: qty 100

## 2014-11-22 MED ORDER — LIDOCAINE-EPINEPHRINE 2 %-1:100000 IJ SOLN
INTRAMUSCULAR | Status: AC
  Filled 2014-11-22: qty 10.2

## 2014-11-22 MED ORDER — FENTANYL CITRATE (PF) 100 MCG/2ML IJ SOLN
INTRAMUSCULAR | Status: DC | PRN
  Administered 2014-11-22 (×2): 25 ug via INTRAVENOUS

## 2014-11-22 MED ORDER — TAMSULOSIN HCL 0.4 MG PO CAPS
0.4000 mg | ORAL_CAPSULE | Freq: Every day | ORAL | Status: DC
  Administered 2014-11-22: 0.4 mg via ORAL
  Filled 2014-11-22 (×2): qty 1

## 2014-11-22 SURGICAL SUPPLY — 34 items
ALCOHOL 70% 16 OZ (MISCELLANEOUS) ×3 IMPLANT
ATTRACTOMAT 16X20 MAGNETIC DRP (DRAPES) ×3 IMPLANT
BLADE SURG 15 STRL LF DISP TIS (BLADE) ×2 IMPLANT
BLADE SURG 15 STRL SS (BLADE) ×6
COVER SURGICAL LIGHT HANDLE (MISCELLANEOUS) ×3 IMPLANT
GAUZE PACKING FOLDED 2  STR (GAUZE/BANDAGES/DRESSINGS) ×2
GAUZE PACKING FOLDED 2 STR (GAUZE/BANDAGES/DRESSINGS) ×1 IMPLANT
GAUZE SPONGE 4X4 16PLY XRAY LF (GAUZE/BANDAGES/DRESSINGS) ×3 IMPLANT
GLOVE BIOGEL PI IND STRL 6 (GLOVE) ×1 IMPLANT
GLOVE BIOGEL PI INDICATOR 6 (GLOVE) ×2
GLOVE SURG ORTHO 8.0 STRL STRW (GLOVE) ×3 IMPLANT
GLOVE SURG SS PI 6.0 STRL IVOR (GLOVE) ×3 IMPLANT
GOWN STRL REUS W/ TWL LRG LVL3 (GOWN DISPOSABLE) ×1 IMPLANT
GOWN STRL REUS W/TWL 2XL LVL3 (GOWN DISPOSABLE) ×3 IMPLANT
GOWN STRL REUS W/TWL LRG LVL3 (GOWN DISPOSABLE) ×3
HEMOSTAT SURGICEL 2X14 (HEMOSTASIS) ×3 IMPLANT
KIT BASIN OR (CUSTOM PROCEDURE TRAY) ×3 IMPLANT
KIT ROOM TURNOVER OR (KITS) ×3 IMPLANT
MANIFOLD NEPTUNE WASTE (CANNULA) ×3 IMPLANT
NDL BLUNT 16X1.5 OR ONLY (NEEDLE) ×1 IMPLANT
NEEDLE BLUNT 16X1.5 OR ONLY (NEEDLE) ×3 IMPLANT
NS IRRIG 1000ML POUR BTL (IV SOLUTION) ×3 IMPLANT
PACK EENT II TURBAN DRAPE (CUSTOM PROCEDURE TRAY) ×3 IMPLANT
PAD ARMBOARD 7.5X6 YLW CONV (MISCELLANEOUS) ×3 IMPLANT
SPONGE SURGIFOAM ABS GEL 100 (HEMOSTASIS) IMPLANT
SPONGE SURGIFOAM ABS GEL 12-7 (HEMOSTASIS) IMPLANT
SPONGE SURGIFOAM ABS GEL SZ50 (HEMOSTASIS) IMPLANT
SUCTION FRAZIER TIP 10 FR DISP (SUCTIONS) ×3 IMPLANT
SUT CHROMIC 3 0 PS 2 (SUTURE) ×6 IMPLANT
SYR 50ML SLIP (SYRINGE) ×3 IMPLANT
TOWEL OR 17X26 10 PK STRL BLUE (TOWEL DISPOSABLE) ×3 IMPLANT
TUBE CONNECTING 12'X1/4 (SUCTIONS) ×1
TUBE CONNECTING 12X1/4 (SUCTIONS) ×2 IMPLANT
YANKAUER SUCT BULB TIP NO VENT (SUCTIONS) ×3 IMPLANT

## 2014-11-22 NOTE — H&P (Signed)
11/22/2014  Patient: [name] Graham Date of Birth: 09/05/1928 MRN: [id number]   [name] Graham is an 81 year old male with severe aortic stenosis, chronic atrial fibrillation, chronic anticoagulation. Patient was referred for evaluation of poor dentition and to rule out dental infection that may affect and anticipated aortic valve replacement or put the patient at risk for endocarditis. Patient now presents for selective extractions of indicated teeth with alveoloplasty and gross debridement of remaining dentition the operating room. Warfarin therapy has been discontinued and is currently being bridged with Lovenox therapy. Patient was evaluated yesterday by anesthesiology PA, Kym GroomAllison Zellnack, and found to have a history of some dizziness with hypotension. Toprol was discontinued at that time, and blood pressure will be reevaluated this morning. Lovenox therapy was discontinued after yesterday's morning dose. Please use progress note of Dr. Excell Seltzerooper as the H&P for the dental operating room procedure.  Charlynne Panderonald F Shellsea Borunda, DDS  Progress Notes Info    Author Note Status Last Update User Last Update Date/Time   Tonny BollmanMichael Cooper, MD Signed Tonny BollmanMichael Cooper, MD 10/31/2014 6:55 AM    Progress Notes    Expand All Collapse All      Cardiology Office Note   Date: 10/31/2014   ID: [name] Graham, DOB 12/30/1928, MRN [id number]  PCP: [name], [name], MD Cardiologist: Tonny BollmanMichael Cooper, MD   Chief Complaint  Patient presents with  . Edema    legs    History of Present Illness: [name] DoneFranklin D Hancock is a 81 y.o. male who presents for followup evaluation. The patient has severe aortic stenosis but is not a candidate for surgical aortic valve replacement because of severe comorbidities medical conditions. He has Parkinson's disease, generalized frailty, and history of pulmonary emboli maintained on chronic warfarin. The patient also has dementia. He was not a  candidate for transcatheter valve replacement because his aortic valve annulus is too large for any of the available TAVR valves.  He recently had problems with bladder outflow obstruction. He underwent an office-based urologic procedure and things are improved now. He had a foley catheter in for about 3 weeks but has been doing ok with a catheter since the procedure.   From a cardiac perspective, he denies chest pain or shortness of breath. He's been able to walk about 300 feet without symptoms of lightheadedness, dizziness, or chest pain. Just gained his strength back over the past few days, but now feels like he is doing pretty well. He does complain of tooth pain and he would like to have a tooth pulled. He's been concerned about whether this will be feasible on anticoagulation  Past Medical History  Diagnosis Date  . Pulmonary embolus     bilateral  . Hypothyroid   . Hypertension   . Chronic atrial fibrillation     INR therpeutic on Coumadin  . Dementia     mild, with hallucinations status post psychiatric evaluation during admission  . Kidney cyst, acquired   . A-fib   . CAD (coronary artery disease)     non-obstructive by cath 2006, no stents, ejection fraction  . Leukocytosis   . Tremor   . Parkinson disease   . Severe aortic stenosis   . HYPERCHOLESTEROLEMIA IIA 12/20/2008    Qualifier: Diagnosis of By: Riley KillStuckey, MD, Johny SaxFACC, Thomas David   . Peripheral neuropathy   . Weakness of both legs 03/18/2012  . Macrocytic anemia 03/19/2012  . Urinary incontinence 03/28/2012  . Parkinson's disease dementia 01/20/2013  . Limited mobility   . Morbid obesity  with BMI of 40.0-44.9, adult   . Chronic diastolic congestive heart failure     Past Surgical History  Procedure Laterality Date  . Mole removal      Current Outpatient Prescriptions  Medication Sig Dispense Refill  .  acetaminophen (ARTHRITIS PAIN RELIEF) 650 MG CR tablet Take 650 mg by mouth. Take 2 tabs AM and 1 tab PM     . allopurinol (ZYLOPRIM) 300 MG tablet Take 300 mg by mouth daily.     Marland Kitchen aspirin 81 MG chewable tablet Chew 81 mg by mouth daily.    . carbidopa-levodopa (SINEMET CR) 50-200 MG per tablet Take 1 tablet by mouth 3 (three) times daily. 90 tablet 0  . finasteride (PROSCAR) 5 MG tablet Take 5 mg by mouth daily.    . fluticasone (FLONASE) 50 MCG/ACT nasal spray Place 1 spray into both nostrils daily as needed for allergies.     . furosemide (LASIX) 80 MG tablet Take 1 tablet (80 mg total) by mouth daily. 30 tablet 1  . levothyroxine (SYNTHROID) 175 MCG tablet Take 175 mcg by mouth daily.     Marland Kitchen LORazepam (ATIVAN) 1 MG tablet Take 1 tablet (1 mg total) by mouth 2 (two) times daily as needed for anxiety. 30 tablet 0  . metoprolol (TOPROL XL) 50 MG 24 hr tablet Take 50 mg by mouth daily.     . Misc Natural Products (OSTEO BI-FLEX ADV DOUBLE ST PO) Take 2 tablets by mouth daily.     . Omega-3 Fatty Acids (FISH OIL) 1000 MG CAPS Take 1 capsule by mouth 2 (two) times daily.     . pantoprazole (PROTONIX) 40 MG tablet Take 40 mg by mouth daily.     . polyethylene glycol (MIRALAX / GLYCOLAX) packet Take 17 g by mouth every evening.    . potassium chloride SA (K-DUR,KLOR-CON) 20 MEQ tablet Take 20 mEq by mouth daily.    . pravastatin (PRAVACHOL) 20 MG tablet Take 20 mg by mouth daily.     Marland Kitchen senna (SENOKOT) 8.6 MG tablet Take 3 tablets by mouth at bedtime.    . sucralfate (CARAFATE) 1 G tablet Take 1 g by mouth 2 (two) times daily.     . tamsulosin (FLOMAX) 0.4 MG CAPS capsule Take 0.4 mg by mouth daily after supper.     . warfarin (COUMADIN) 2.5 MG tablet Take 2.5-5 mg by mouth daily at 6 PM. Takes 2.5mg  (1 tablet) daily except  (2 tablets) on Monday and Friday     No current facility-administered  medications for this visit.    Allergies: Morphine and related and Nifedipine   Social History: The patient  reports that he has quit smoking. His smoking use included Cigarettes. He has a 24 pack-year smoking history. He has never used smokeless tobacco. He reports that he does not drink alcohol or use illicit drugs.   Family History: The patient's family history includes Heart failure in his mother; Microcephaly in his father.    ROS: Please see the history of present illness. Otherwise, review of systems is positive for Leg swelling, easy bruising, Toothache, and constipation. All other systems are reviewed and negative.    PHYSICAL EXAM: VS: BP 102/62 mmHg  Pulse 94  Ht 6' (1.829 m)  Wt 294 lb 12.8 oz (133.72 kg)  BMI 39.97 kg/m2 , BMI Body mass index is 39.97 kg/(m^2). GEN: Well nourished, well developed, pleasant elderly man in no acute distress  HEENT: normal  Neck: no JVD, no masses.  No carotid bruits Cardiac: irregularly irregular grade 3/6 harsh late peaking systolic murmur at the right upper sternal border Respiratory: clear to auscultation bilaterally, normal work of breathing GI: soft, nontender, nondistended, + BS MS: no deformity or atrophy  Ext: 1+ bilateral pretibial edema Skin: warm and dry, no rash Neuro: Strength and sensation are intact Psych: euthymic mood, full affect  EKG: EKG is ordered today. The ekg ordered today shows Atrial fibrillation 94 bpm , nonspecific IVCD, left ventricular hypertrophy with repolarization abnormality.  Recent Labs: 02/28/2014: TSH 5.528* 03/06/2014: ALT 7; Pro B Natriuretic peptide (BNP) 3135.0* 03/08/2014: BUN 24*; Creatinine 1.14; Hemoglobin 13.3; Platelets 131*; Potassium 3.8; Sodium 139   Lipid Panel   Labs (Brief)    No results found for: CHOL, TRIG, HDL, CHOLHDL, VLDL, LDLCALC, LDLDIRECT     Wt Readings from Last 3 Encounters:  10/30/14 294 lb 12.8 oz (133.72 kg)  03/21/14 284 lb (128.822 kg)   03/08/14 279 lb 12.8 oz (126.916 kg)     Cardiac Studies Reviewed: 2-D echocardiogram 09/14/2013: - Left ventricle: Systolic function was normal. The estimated ejection fraction was in the range of 55% to 60%. Wall motion was normal; there were no regional wall motion abnormalities. Doppler parameters are consistent with a reversible restrictive pattern, indicative of decreased left ventricular diastolic compliance and/or increased left atrial pressure (grade 3 diastolic dysfunction). - Aortic valve: Valve mobility was restricted. There was severe stenosis. Peak velocity: 457cm/s (S). Mean gradient: 52mm Hg (S). Valve area: 0.85cm^2(VTI). Valve area: 0.86cm^2 (Vmax). - Aorta: Aortic root dimension: 43mm (ED). - Mitral valve: Calcified annulus. Mild regurgitation. Valve area by continuity equation (using LVOT flow): 3.45cm^2. - Left atrium: The atrium was severely dilated. Anterior-posterior dimension: 64mm (2D). - Right ventricle: The cavity size was mildly dilated. Wall thickness was normal. - Right atrium: The atrium was mildly dilated. - Tricuspid valve: Moderate regurgitation. - Pulmonary arteries: Systolic pressure was mildly increased. PA peak pressure: 44mm Hg (S). Impressions:  - Aortic stenosis in 6/14 was severe with peak velocity ranging 4.3-4.37m/s (stable when compared to current study).  ASSESSMENT AND PLAN: 1. Severe aortic stenosis: The patient is not a candidate for TAVR because of his large valve annulus. His annulus is to bed for the largest manufactured Transcatheter heart valve bioprosthesis. He is not a candidate for conventional surgical aortic valve replacement because of medical comorbidities. Will continue with medical therapy.  2. Atrial fibrillation, chronic: Continue anticoagulation with warfarin. Heart rate is controlled with metoprolol succinate.  3. Chronic diastolic heart failure: appears euvolemic on  exam. He tolerates furosemide 80 mg daily. No major changes.   With respect to his toothache and need for treatment of a broken tooth, he would require Lovenox bridging. He has a history of thrombotic complications off of warfarin. Will refer him to Dr Kristin [facility]ins at [facility].   Current medicines are reviewed with the patient today. The patient does not have concerns regarding medicines.  The following changes have been made: no change  Labs/ tests ordered today include:  Orders Placed This Encounter  Procedures  . Ambulatory referral to Dentistry  . EKG 12-Lead   Disposition: FU 6 months  Signed, Tonny Bollman, MD  10/31/2014 6:53 AM  [facility] [address] [place], [place], [place] [address] Phone: [phone]; Fax: [phone]

## 2014-11-22 NOTE — Transfer of Care (Signed)
Immediate Anesthesia Transfer of Care Note  Patient: [name] Graham  Procedure(s) Performed: Procedure(s): MULTIPLE EXTRACTION OF TOOTH #'S 8, 18, 30, AND 31 WITH ALVEOLOPLASTY AND GROSS DEBRIDEMENT OF REMAINING TEETH (N/A)  Patient Location: PACU  Anesthesia Type:MAC  Level of Consciousness: awake, alert , oriented and patient cooperative  Airway & Oxygen Therapy: Patient Spontanous Breathing and Patient connected to nasal cannula oxygen  Post-op Assessment: Report given to RN, Post -op Vital signs reviewed and stable and Patient moving all extremities  Post vital signs: Reviewed and stable  Complications: No apparent anesthesia complications

## 2014-11-22 NOTE — Progress Notes (Signed)
PRE-OPERATIVE NOTE:  11/22/2014   Madelynn DoneFranklin D Choo [id number]  VITALS: BP 123/67 mmHg  Pulse 93  Temp(Src) 97.9 F (36.6 C) (Oral)  Resp 20  SpO2 97%  Lab Results  Component Value Date   WBC 4.8 11/21/2014   HGB 12.0* 11/21/2014   HCT 36.2* 11/21/2014   MCV 104.0* 11/21/2014   PLT 141* 11/21/2014   BMET    Component Value Date/Time   NA 136 11/21/2014 1043   K 3.7 11/21/2014 1043   CL 102 11/21/2014 1043   CO2 27 11/21/2014 1043   GLUCOSE 127* 11/21/2014 1043   BUN 20 11/21/2014 1043   CREATININE 1.31 11/21/2014 1043   CALCIUM 9.0 11/21/2014 1043   GFRNONAA 49* 11/21/2014 1043   GFRAA 56* 11/21/2014 1043    Lab Results  Component Value Date   INR 1.17 11/21/2014   INR 3.13* 03/08/2014   INR 2.76* 03/07/2014   No results found for: PTT   Madelynn DoneFranklin D Norell presents for extraction of indicated teeth with alveoloplasty and gross debridement of remaining dentition in the operating room with monitored anesthesia care.   SUBJECTIVE: The patient denies any acute medical or dental changes and agrees to proceed with treatment as planned.  EXAM: No sign of acute dental changes.   ASSESSMENT: Patient is affected by retained root segment, dental caries, chronic periodontitis, and accretions.  PLAN: Patient agrees to proceed with treatment as planned in the operating room as previously discussed and accepts the risks, benefits, and complications of the proposed treatment. Patient is aware of the risk for bleeding, bruising, swelling, infection, pain, nerve damage, soft tissue damage, damage to adjacent teeth, sinus involvement, root tip fracture, mandible fracture, and the risks of complications associated with the anesthesia. Patient is aware of the potential risks for complications up to and including death due to his overall cardiovascular and respiratory compromise. Patient also is aware of the potential for other complications not mentioned above.   Charlynne Panderonald F. Oden Lindaman,  DDS

## 2014-11-22 NOTE — Anesthesia Preprocedure Evaluation (Addendum)
Anesthesia Evaluation  Patient identified by MRN, date of birth, ID band Patient awake    Reviewed: Allergy & Precautions, NPO status , Patient's Chart, lab work & pertinent test results  Airway Mallampati: III  TM Distance: >3 FB Neck ROM: Full    Dental  (+) Poor Dentition   Pulmonary former smoker,  breath sounds clear to auscultation        Cardiovascular hypertension, + CAD and + Peripheral Vascular Disease + Valvular Problems/Murmurs AS Rhythm:Regular Rate:Normal     Neuro/Psych TIA Neuromuscular disease    GI/Hepatic negative GI ROS, Neg liver ROS,   Endo/Other  diabetes, Type 2Hypothyroidism Morbid obesity  Renal/GU CRFRenal disease     Musculoskeletal  (+) Arthritis -,   Abdominal   Peds  Hematology  (+) anemia , Hgb 12.0   Anesthesia Other Findings   Reproductive/Obstetrics                           Anesthesia Physical Anesthesia Plan  ASA: IV  Anesthesia Plan: MAC   Post-op Pain Management:    Induction: Intravenous  Airway Management Planned: Natural Airway and Nasal Cannula  Additional Equipment:   Intra-op Plan:   Post-operative Plan:   Informed Consent: I have reviewed the patients History and Physical, chart, labs and discussed the procedure including the risks, benefits and alternatives for the proposed anesthesia with the patient or authorized representative who has indicated his/her understanding and acceptance.   Dental advisory given  Plan Discussed with: CRNA  Anesthesia Plan Comments:         Anesthesia Quick Evaluation

## 2014-11-22 NOTE — Progress Notes (Signed)
ANTICOAGULATION CONSULT NOTE - Initial Consult  Pharmacy Consult for warfarin Indication: severe aortic stenosis/AFib  Allergies  Allergen Reactions  . Morphine And Related Other (See Comments)    Goes Crazy  . Nifedipine Other (See Comments)    "makes me go crazy" procardia    Patient Measurements:    Vital Signs: Temp: 98.9 F (37.2 C) (04/21 1410) Temp Source: Oral (04/21 0811) BP: 127/82 mmHg (04/21 1406) Pulse Rate: 93 (04/21 1406)  Labs:  Recent Labs  11/21/14 1043  HGB 12.0*  HCT 36.2*  PLT 141*  APTT 48*  LABPROT 15.1  INR 1.17  CREATININE 1.31    Estimated Creatinine Clearance: 60.7 mL/min (by C-G formula based on Cr of 1.31).   Medical History: Past Medical History  Diagnosis Date  . Pulmonary embolus     bilateral  . Hypothyroid   . Hypertension   . Chronic atrial fibrillation     INR therpeutic on Coumadin  . Dementia     mild, with hallucinations status post psychiatric evaluation during admission  . Kidney cyst, acquired   . A-fib   . CAD (coronary artery disease)     non-obstructive by cath 2006, no stents, ejection fraction  . Leukocytosis   . Tremor   . Parkinson disease   . Severe aortic stenosis   . HYPERCHOLESTEROLEMIA  IIA 12/20/2008    Qualifier: Diagnosis of  By: Riley KillStuckey, MD, Johny SaxFACC, Thomas David   . Peripheral neuropathy   . Weakness of both legs 03/18/2012  . Macrocytic anemia 03/19/2012  . Urinary incontinence 03/28/2012  . Parkinson's disease dementia 01/20/2013  . Limited mobility   . Morbid obesity with BMI of 40.0-44.9, adult   . Chronic diastolic congestive heart failure   . Anginal pain     occ-  . Heart murmur   . Peripheral vascular disease     dvt yrs ago  . Pneumonia     hx  . Arthritis   . Diabetes mellitus without complication     borderline no med    Assessment: [age] YOM with severe aortic stenosis and AFib on warfarin PTA- home dose 2.5mg  daily except 5mg  on Mondays and Fridays. Patient underwent  multiple tooth extractions today and was bridged with Lovenox for this procedure, now to resume warfarin. INR 1.17 this morning. Hgb 12, plts 141.  Goal of Therapy:  INR 2-3 Monitor platelets by anticoagulation protocol: Yes   Plan:  -warfarin 5mg  po x1 tonight -daily INR -follow CBC closely  Britnay Magnussen D. Anuja Manka, PharmD, BCPS Clinical Pharmacist Pager: [phone] 11/22/2014 2:44 PM

## 2014-11-22 NOTE — Discharge Instructions (Signed)

## 2014-11-22 NOTE — H&P (Signed)
11/22/2014  Patient:            [name] DoneFranklin D Difatta Date of Birth:  12/21/1928 MRN:                [id number]   Please se previously dictated H and P. Dr. Kristin BruinsKulinski

## 2014-11-22 NOTE — H&P (Signed)
Chief Complaint:  Severe AS  HPI:  This is a 81 y.o. male with a past medical history significant for severe aortic stenosis, chronic diastolic heart failure, chronic atrial fibrillation, chronic anticoagulation admitted for elective multiple tooth extractions. Here for anticoagulation bridging.  The patient is not a candidate for TAVR because of his large valve annulus. His annulus is to big for the largest manufacturedtranscatheter heart valve bioprosthesis. He is not a candidate for conventional surgical aortic valve replacement because of medical comorbidities. Plan is to continue with medical therapy.  PMHx:  Past Medical History  Diagnosis Date  . Pulmonary embolus     bilateral  . Hypothyroid   . Hypertension   . Chronic atrial fibrillation     INR therpeutic on Coumadin  . Dementia     mild, with hallucinations status post psychiatric evaluation during admission  . Kidney cyst, acquired   . A-fib   . CAD (coronary artery disease)     non-obstructive by cath 2006, no stents, ejection fraction  . Leukocytosis   . Tremor   . Parkinson disease   . Severe aortic stenosis   . HYPERCHOLESTEROLEMIA  IIA 12/20/2008    Qualifier: Diagnosis of  By: Riley KillStuckey, MD, Johny SaxFACC, Thomas David   . Peripheral neuropathy   . Weakness of both legs 03/18/2012  . Macrocytic anemia 03/19/2012  . Urinary incontinence 03/28/2012  . Parkinson's disease dementia 01/20/2013  . Limited mobility   . Morbid obesity with BMI of 40.0-44.9, adult   . Chronic diastolic congestive heart failure   . Anginal pain     occ-  . Heart murmur   . Peripheral vascular disease     dvt yrs ago  . Pneumonia     hx  . Arthritis   . Diabetes mellitus without complication     borderline no med    Past Surgical History  Procedure Laterality Date  . Mole removal    . Greenfield filter    . Prostate ablation      FAMHx:  Family History  Problem Relation Age of Onset  . Heart failure Mother   . Microcephaly  Father     SOCHx:   reports that he quit smoking about 60 years ago. His smoking use included Cigarettes. He has a 24 pack-year smoking history. He has never used smokeless tobacco. He reports that he does not drink alcohol or use illicit drugs.  ALLERGIES:  Allergies  Allergen Reactions  . Morphine And Related Other (See Comments)    Goes Crazy  . Nifedipine Other (See Comments)    "makes me go crazy" procardia    ROS: Pertinent items are noted in HPI.  HOME MEDS: Medications Prior to Admission  Medication Sig Dispense Refill  . acetaminophen (ARTHRITIS PAIN RELIEF) 650 MG CR tablet Take 650 mg by mouth. Take 2 tabs AM and 1 tab PM     . allopurinol (ZYLOPRIM) 300 MG tablet Take 300 mg by mouth daily.      Marland Kitchen. aspirin 81 MG chewable tablet Chew 81 mg by mouth daily.    . carbidopa-levodopa (SINEMET CR) 50-200 MG per tablet Take 1 tablet by mouth 3 (three) times daily. 90 tablet 0  . enoxaparin (LOVENOX) 120 MG/0.8ML injection Inject 120 mg into the skin 2 (two) times daily.     . finasteride (PROSCAR) 5 MG tablet Take 5 mg by mouth daily.    . fluticasone (FLONASE) 50 MCG/ACT nasal spray Place 1 spray into both nostrils daily  as needed for allergies.     Marland Kitchen levothyroxine (SYNTHROID) 175 MCG tablet Take 175 mcg by mouth daily.      Marland Kitchen LORazepam (ATIVAN) 1 MG tablet Take 1 tablet (1 mg total) by mouth 2 (two) times daily as needed for anxiety. 30 tablet 0  . metoprolol (TOPROL XL) 50 MG 24 hr tablet Take 50 mg by mouth daily.      . Misc Natural Products (OSTEO BI-FLEX ADV DOUBLE ST PO) Take 2 tablets by mouth daily.     . Omega-3 Fatty Acids (FISH OIL) 1000 MG CAPS Take 1 capsule by mouth 2 (two) times daily.     . pantoprazole (PROTONIX) 40 MG tablet Take 40 mg by mouth daily.     . polyethylene glycol (MIRALAX / GLYCOLAX) packet Take 17 g by mouth every evening.    . potassium chloride SA (K-DUR,KLOR-CON) 20 MEQ tablet Take 20 mEq by mouth daily.    . pravastatin (PRAVACHOL) 20 MG  tablet Take 20 mg by mouth daily.     Marland Kitchen senna (SENOKOT) 8.6 MG tablet Take 3 tablets by mouth at bedtime.    . sucralfate (CARAFATE) 1 G tablet Take 1 g by mouth 2 (two) times daily.      . tamsulosin (FLOMAX) 0.4 MG CAPS capsule Take 0.4 mg by mouth daily after supper.     . warfarin (COUMADIN) 2.5 MG tablet Take 2.5-5 mg by mouth daily at 6 PM. Takes 2.5mg  (1 tablet) daily except  (2 tablets) on Monday and Friday    . furosemide (LASIX) 80 MG tablet Take 1 tablet (80 mg total) by mouth daily. 30 tablet 1    LABS/IMAGING: Results for orders placed or performed during the hospital encounter of 11/22/14 (from the past 48 hour(s))  Glucose, capillary     Status: None   Collection Time: 11/22/14  8:20 AM  Result Value Ref Range   Glucose-Capillary 98 70 - 99 mg/dL  Glucose, capillary     Status: Abnormal   Collection Time: 11/22/14 12:06 PM  Result Value Ref Range   Glucose-Capillary 109 (H) 70 - 99 mg/dL   Comment 1 Notify RN    Comment 2 Document in Chart    No results found.  VITALS: Blood pressure 122/74, pulse 64, temperature 98.9 F (37.2 C), temperature source Oral, resp. rate 14, SpO2 93 %.  EXAM:  General: Alert, oriented x3, no distress Head: no evidence of trauma, PERRL, EOMI, no exophtalmos or lid lag, no myxedema, no xanthelasma; normal ears, nose and oropharynx Neck: normal jugular venous pulsations and no hepatojugular reflux; brisk carotid pulses without delay and no carotid bruits Chest: clear to auscultation, no signs of consolidation by percussion or palpation, normal fremitus, symmetrical and full respiratory excursions Cardiovascular: normal position and quality of the apical impulse, regular rhythm, normal first heart sound and soft second heart sounds, no rubs or gallops, 3/6 mid peaking systolic ejection murmur Abdomen: no tenderness or distention, no masses by palpation, no abnormal pulsatility or arterial bruits, normal bowel sounds, no  hepatosplenomegaly Extremities: no clubbing, cyanosis or edema; 2+ radial, ulnar and brachial pulses bilaterally; 2+ right femoral, posterior tibial and dorsalis pedis pulses; 2+ left femoral, posterior tibial and dorsalis pedis pulses; no subclavian or femoral bruits Neurological: grossly nonfocal   IMPRESSION: Severe but well compensated chronic cardiac conditions (AS, diastolic HF, atrial fibrillation).  Amicar mouth rinse and resume warfarin. Overnight observation.  Per Dr. Kristin Bruins: The patient is to continue using the Amicar 5%  oral rinse. Patient is to rinse with 10 mLs every hour for 10 hours. Patient is to use in a swish and spit manner. Amicar rinse can then be used as needed for persistent oozing. Lovenox therapy is to be discontinued at this time. Warfarin therapy can be restarted slowly and patient is to return to normal dosing at the discretion of the cardiology team. Consideration for discontinuation of warfarin therapy may need to be entertained if excessive bleeding is noted. Patient is return to dental medicine in 7-10 days for evaluation for suture removal.   Thurmon Fair, MD, [facility] [phone] office [phone] pager  11/22/2014, 1:26 PM

## 2014-11-22 NOTE — Op Note (Signed)
OPERATIVE REPORT  Patient:            [name] DoneFranklin D Bogus Date of Birth:  01/09/1929 MRN:                [id number]   DATE OF PROCEDURE:  11/22/2014  PREOPERATIVE DIAGNOSES: 1.  Severe aortic stenosis 2. Chronic apical periodontitis 3. Retained root segment 4. Dental caries 5. Chronic periodontitis 6. Accretions  POSTOPERATIVE DIAGNOSES: 1.  Severe aortic stenosis 2. Chronic apical periodontitis 3. Retained root segment 4. Dental caries 5. Chronic periodontitis 6. Accretions  OPERATIONS: 1. Multiple extraction of tooth numbers 8, 18, 30, and 31 2. 3 Quadrants of alveoloplasty 3. Gross debridement of remaining dentition   SURGEON: Charlynne Panderonald F. Darrnell Mangiaracina, DDS  ASSISTANT: Rory PercyLisa Ingram, (dental assistant)  ANESTHESIA: Monitored anesthesia care per the anesthesia team   MEDICATIONS: 1. Ancef 3 g IV prior to invasive dental procedures. 2. Local anesthesia with a total utilization of 4 carpules each containing 34 mg of lidocaine with 0.017 mg of epinephrine as well as 2 carpules each containing 9 mg of bupivacaine with 0.009 mg of epinephrine.  SPECIMENS: There are 4 teeth that were discarded.  DRAINS: None  CULTURES: None  COMPLICATIONS: None   ESTIMATED BLOOD LOSS: 150 mLs.  INTRAVENOUS FLUIDS: 1500 mLs of Lactated ringers solution.  INDICATIONS: The patient was previously diagnosed with severe aortic stenosis.  A dental consultation was then requested to evaluate poor dentition.  The patient was examined and treatment planned for multiple extractions of indicated teeth with alveoloplasty and gross debridement of remaining dentition.  This treatment plan was formulated to decrease the risks and complications associated with dental infection from affecting the patient's systemic health and risk for endocarditis.  OPERATIVE FINDINGS: Patient was examined operating room number 6.  The teeth were identified for extraction.  During the exam extensive dental caries were noted  to be associated with tooth numbers 18, 30, and 31 on the buccal aspect. Tooth #31 was therefore added to the treatment plan for removal at this time. The patient was noted be affected by chronic periodontitis, accretions, chronic apical periodontitis, retained root segment #8, and extensive dental caries.   DESCRIPTION OF PROCEDURE: Patient was brought to the main operating room number 6. Patient was then placed in the supine position on the operating table.  Monitored anesthesia care was then induced per the anesthesia team. The patient was then prepped and draped in the usual manner for dental medicine procedure. A timeout was performed. The patient was identified and procedures were verified.  The oral cavity was then thoroughly examined with the findings noted above. The patient was then ready for dental medicine procedure as follows:  Local anesthesia was then administered sequentially with a total utilization of 4 carpules each containing 34 mg of lidocaine with 0.017 mg of epinephrine as well as 2 carpules  each containing 9 mg bupivacaine with 0.009 mg of epinephrine. During the entire dental medicine procedure, 4 x 4's were used to act as a throat pack and to collect excessive fluids and blood within the mouth during the provision of dental treatment.    The Maxillary  anterior segment was first approached. Anesthesia was then delivered utilizing infiltration with lidocaine with epinephrine. A #15 blade incision was then made from the  mesial of #7 and extended to the mesial of #9.  A  surgical flap was then carefully reflected. Appropriate amounts of buccal and interseptal bone were then removed utilizing a surgical handpiece and bur  and copious amounts of sterile water.  Tooth #8 and removed with a 150 forceps without complications. Alveoloplasty was then performed utilizing a ronguers and bone file. The surgical site was then irrigated with copious amounts of sterile saline. The tissues were  approximated and trimmed appropriately.  A piece of Surgicel was then placed in the extraction socket appropriately. The surgical site was then closed from the mesial numbers 7 extended to the mesial #9 utilizing 3-0 chromic gut suture in a continuous interrupted suture technique 1.  At this point time, the mandibular quadrants were approached. The patient was given bilateral inferior alveolar nerve blocks and long buccal nerve blocks utilizing the bupivacaine with epinephrine. Further infiltration was then achieved utilizing the lidocaine with epinephrine. A 15 blade incision was then made from the distal of #32 and extended to the mesial of #28.  A surgical flap was then carefully reflected. Appropriate amounts of buccal and interseptal bone were then removed utilizing a surgical handpiece and copious amount of sterile water around tooth numbers 30 and 31.  These teeth were then subluxated with a series of straight elevators. The PFM crowns and coronal portions on 30 and 31 were removed at this time with use of the 23 forceps. Further bone was then removed around tooth numbers 30 and 31 appropriately with the surgical handpiece and bur and copious amounts sterile water. The patient was then elevated out using a series of cryers elevators and a 151 forceps. Alveoloplasty was then performed utilizing a rongeurs and bone file. The tissues were approximated and trimmed appropriately. The surgical sites were then irrigated with copious amounts of sterile saline.  A piece of Surgicel was then placed in each of the extraction sockets appropriately. The mandibular  right surgical site was then closed from the distal of #32 and extended to the mesial #28 utilizing 3-0 chromic gut suture in a continuous interrupted suture technique 1. One individual interrupted sutures then placed to further close the surgical site as needed.   The mandibular left surgical site was then approached. A 15 blade incision was then made  from the distal of #17 and extended to the mesial of #20. A surgical flap was then carefully reflected. Appropriate amounts of buccal and interseptal bone was then removed with a surgical handpiece and bur and copious pulsed sterile water around tooth #18. The coronal aspect of tooth #18 was then removed with a 23 forceps leaving the root remaining. Further bone was then removed around tooth #18 utilizing the surgical handpiece and bur and copious pus sterile water. The tooth was then subluxated with a series straight elevators. The tooth was then removed with a 151 forceps without complications. Alveoloplasty was then performed utilizing a rongeur and bone file.  The surgical site was then irrigated with copious amounts sterile saline. The tissues were approximated and trimmed appropriately. A piece of Surgicel was then placed in the extraction socket appropriately. The mandibular left surgical site was then closed from the distal of #17 and extended the mesial of #20 utilizing 3-0 chromic gut suture in a continuous interrupted suture technique 1.  The remaining teeth were then approached at this time. A sonic scaler was then utilized to remove accretions. A series of hand curettes were then utilized refine removal of accretions. A sonic scaler was then again used to further refine removal of accretions.   At this point time, the entire mouth was irrigated with copious amounts of sterile saline. The patient was then examined for complications,  seeing none, the dental medicine procedure was deemed to be complete.  A series of 4 x 4 gauze moistened with Amicar 5% rinse were placed in the mouth to aid hemostasis. The patient was then handed over to the anesthesia team for final disposition. After an appropriate amount of time, the patient was taken to the postanesthsia care ungood condition. All counts were correct for the dental medicine procedure.  The patient is to continue using the Amicar 5% oral rinse.  Patient is to rinse with 10 mLs every hour for 10 hours. Patient is to use in a swish and spit manner. Amicar rinse can then be used as needed for persistent oozing. Lovenox therapy is to be discontinued at this time. Warfarin therapy can be restarted slowly and patient is to return to normal dosing at the discretion of the cardiology team. Consideration for discontinuation of warfarin therapy may need to be entertained if excessive bleeding is noted. Patient is return to dental medicine in 7-10 days for evaluation for suture removal.   Charlynne Pander, DDS.

## 2014-11-23 ENCOUNTER — Encounter (HOSPITAL_COMMUNITY): Payer: Self-pay | Admitting: Dentistry

## 2014-11-23 DIAGNOSIS — I10 Essential (primary) hypertension: Secondary | ICD-10-CM | POA: Diagnosis not present

## 2014-11-23 DIAGNOSIS — K045 Chronic apical periodontitis: Secondary | ICD-10-CM | POA: Diagnosis not present

## 2014-11-23 DIAGNOSIS — K0530 Chronic periodontitis, unspecified: Secondary | ICD-10-CM | POA: Diagnosis not present

## 2014-11-23 DIAGNOSIS — I482 Chronic atrial fibrillation: Secondary | ICD-10-CM | POA: Diagnosis not present

## 2014-11-23 DIAGNOSIS — I350 Nonrheumatic aortic (valve) stenosis: Secondary | ICD-10-CM | POA: Diagnosis not present

## 2014-11-23 DIAGNOSIS — K08409 Partial loss of teeth, unspecified cause, unspecified class: Secondary | ICD-10-CM

## 2014-11-23 LAB — CBC
HCT: 32.3 % — ABNORMAL LOW (ref 39.0–52.0)
Hemoglobin: 11.1 g/dL — ABNORMAL LOW (ref 13.0–17.0)
MCH: 35.4 pg — ABNORMAL HIGH (ref 26.0–34.0)
MCHC: 34.4 g/dL (ref 30.0–36.0)
MCV: 102.9 fL — AB (ref 78.0–100.0)
PLATELETS: 134 10*3/uL — AB (ref 150–400)
RBC: 3.14 MIL/uL — AB (ref 4.22–5.81)
RDW: 15.1 % (ref 11.5–15.5)
WBC: 6.5 10*3/uL (ref 4.0–10.5)

## 2014-11-23 LAB — GLUCOSE, CAPILLARY
GLUCOSE-CAPILLARY: 141 mg/dL — AB (ref 70–99)
Glucose-Capillary: 102 mg/dL — ABNORMAL HIGH (ref 70–99)

## 2014-11-23 LAB — PROTIME-INR
INR: 1.14 (ref 0.00–1.49)
PROTHROMBIN TIME: 14.7 s (ref 11.6–15.2)

## 2014-11-23 MED ORDER — CARBIDOPA-LEVODOPA ER 50-200 MG PO TBCR: 1.0000 | EXTENDED_RELEASE_TABLET | Freq: Three times a day (TID) | ORAL | Status: AC

## 2014-11-23 NOTE — Progress Notes (Signed)
Pt discharging via wheelchair, escorted by volunteer, accompanied by family. Discharge instructions provided to patient and daughter (who also helps pt at home), verbalize understanding and able to provide appropriate teach back. PIV discontinued, site without s/s of complication. Tele box removed and returned to unit. Denies needs or questions at this time.

## 2014-11-23 NOTE — Progress Notes (Signed)
UR completed 

## 2014-11-23 NOTE — Progress Notes (Signed)
POST OPERATIVE NOTE:  11/23/2014   [name] Graham [id number]  VITALS: BP 102/65 mmHg  Pulse 94  Temp(Src) 99 F (37.2 C) (Oral)  Resp 18  Ht 6' (1.829 m)  Wt 304 lb 6.4 oz (138.075 kg)  BMI 41.28 kg/m2  SpO2 91%  LABS:  Lab Results  Component Value Date   WBC 6.5 11/23/2014   HGB 11.1* 11/23/2014   HCT 32.3* 11/23/2014   MCV 102.9* 11/23/2014   PLT 134* 11/23/2014   BMET    Component Value Date/Time   NA 136 11/21/2014 1043   K 3.7 11/21/2014 1043   CL 102 11/21/2014 1043   CO2 27 11/21/2014 1043   GLUCOSE 127* 11/21/2014 1043   BUN 20 11/21/2014 1043   CREATININE 1.31 11/21/2014 1043   CALCIUM 9.0 11/21/2014 1043   GFRNONAA 49* 11/21/2014 1043   GFRAA 56* 11/21/2014 1043    Lab Results  Component Value Date   INR 1.14 11/23/2014   INR 1.17 11/21/2014   INR 3.13* 03/08/2014   No results found for: PTT   [name] Graham is status post multiple extractions with alveoloplasty and gross debridement of remaining dentition in the operating room with general anesthesia.  Patient was admitted for overnight observation.  SUBJECTIVE: Patient with minimal discomfort this morning. Patient did have some significant dental pain by report after the dental procedures. Patient denies having any active bleeding at this time.  EXAM: There is no sign of oral infection, heme, or ooze. Sutures are intact. Clots are present. Intraoral and extraoral bruising and swelling are noted.  ASSESSMENT: Post operative course is consistent with dental procedures performed in the operating room with general anesthesia. Patient with no evidence of bleeding at this time.  PLAN: 1. Continue dosing of warfarin to therapeutic level.  2. Use salt water rinses as needed to aid healing. 3. Return to dental medicine in 7-10 days for evaluation for suture removal. Sutures may dissolve on their own. 4. Advance diet as tolerated with soft diet at this time. 5. Patient is currently cleared for  discharge from a dental standpoint.  Charlynne Panderonald F. Zadin Lange, DDS

## 2014-11-23 NOTE — Progress Notes (Signed)
Chaplain responded to consult.  Pt sitting up in chair, daughter at bedside.  Pt appears/seems to be in good spirits.  Pt and daughter understand pending discharge.  Pt and daughter report that pt is avid Equities traderchurchgoer, attends every Sunday, Wednesday and Wednesday night unless sick. Chaplain provided emotional support and can follow up as needed.  Erroll LunaOvercash, Rylyn Zawistowski A, Chaplain 11/23/2014 11:02 AM

## 2014-11-23 NOTE — Discharge Summary (Signed)
Discharge Summary   Patient ID: [name] Graham,  MRN: [id number], DOB/AGE: 79/12/1928 79 y.o.  Admit date: 11/22/2014 Discharge date: 11/23/2014  Primary Care Provider: [name], [name] Primary Cardiologist: Judie PetitM. Excell Seltzerooper, MD   Discharge Diagnoses Principal Problem:   Chronic periodontitis  **s/p extractions of tooth numbers 8, 18, 30, and 31.  Active Problems:   Severe aortic stenosis   HYPERTENSION, BENIGN   Atrial fibrillation   Chronic diastolic congestive heart failure   HYPERCHOLESTEROLEMIA  IIA   Parkinson's disease  Allergies Allergies  Allergen Reactions  . Morphine And Related Other (See Comments)    Goes Crazy  . Nifedipine Other (See Comments)    "makes me go crazy" procardia   Procedures  11/22/2014 OPERATIONS: 1. Multiple extraction of tooth numbers 8, 18, 30, and 31 2. 3 Quadrants of alveoloplasty 3. Gross debridement of remaining dentition  History of Present Illness  This is a 81 y.o. male with a past medical history significant for severe aortic stenosis, chronic diastolic heart failure, chronic atrial fibrillation, chronic anticoagulation admitted for elective multiple tooth extractions on 4/21 in the setting of chronic coumadin anticoagulation requiring lovenox bridging prior to the procedure.  Hospital Course  Patient [facility] and underwent successful extractions of  tooth numbers 8, 18, 30, and 31 by Dr. Kristin BruinsKulinski.  He tolerated the procedure well and did not experience any significant bleeding postoperatively.  Oral anticoagulation with coumadin has been resumed and he will not be bridged with lovenox at discharge.  He has remained euvolemic on exam and will be discharged home today in good condition.  He will f/u with his PCP on Monday 4/25 for repeat INR and has dentistry f/u in 2 wks.  Discharge Vitals Blood pressure 102/65, pulse 94, temperature 99 F (37.2 C), temperature source Oral, resp. rate 18, height 6' (1.829 m), weight 304 lb 6.4  oz (138.075 kg), SpO2 91 %.  Filed Weights   11/22/14 1514  Weight: 304 lb 6.4 oz (138.075 kg)    Labs  CBC  Recent Labs  11/21/14 1043 11/23/14 0712  WBC 4.8 6.5  HGB 12.0* 11.1*  HCT 36.2* 32.3*  MCV 104.0* 102.9*  PLT 141* 134*   Basic Metabolic Panel  Recent Labs  11/21/14 1043  NA 136  K 3.7  CL 102  CO2 27  GLUCOSE 127*  BUN 20  CREATININE 1.31  CALCIUM 9.0   Disposition  Pt is being discharged home today in good condition.  Follow-up Plans & Appointments      Follow-up Information    Follow up with Charlynne PanderKULINSKI,RONALD F, DDS On 12/04/2014.   Specialty:  Dentistry   Why:  For suture removal, For wound re-check   Contact information:   [address] [place] [address] [phone]       Follow up with Tonny BollmanMichael Cooper, MD.   Specialty:  Cardiology   Why:  as scheduled.   Contact information:   [address] [place] [address] [phone]       Follow up with [name], [name], MD On 11/26/2014.   Specialty:  Family Medicine   Why:  for INR check.   Contact information:   [address] [place] [address] [phone]       Discharge Medications    Medication List    STOP taking these medications        enoxaparin 120 MG/0.8ML injection  Commonly known as:  LOVENOX      TAKE  these medications        allopurinol 300 MG tablet  Commonly known as:  ZYLOPRIM  Take 300 mg by mouth daily.     ARTHRITIS PAIN RELIEF 650 MG CR tablet  Generic drug:  acetaminophen  Take 650 mg by mouth. Take 2 tabs AM and 1 tab PM     aspirin 81 MG chewable tablet  Chew 81 mg by mouth daily.     carbidopa-levodopa 50-200 MG per tablet  Commonly known as:  SINEMET CR  Take 1 tablet by mouth 3 (three) times daily.     finasteride 5 MG tablet  Commonly known as:  PROSCAR  Take 5 mg by mouth daily.     Fish Oil 1000 MG Caps  Take 1 capsule by mouth 2 (two) times daily.     fluticasone 50 MCG/ACT nasal spray  Commonly  known as:  FLONASE  Place 1 spray into both nostrils daily as needed for allergies.     furosemide 80 MG tablet  Commonly known as:  LASIX  Take 1 tablet (80 mg total) by mouth daily.     LORazepam 1 MG tablet  Commonly known as:  ATIVAN  Take 1 tablet (1 mg total) by mouth 2 (two) times daily as needed for anxiety.     OSTEO BI-FLEX ADV DOUBLE ST PO  Take 2 tablets by mouth daily.     pantoprazole 40 MG tablet  Commonly known as:  PROTONIX  Take 40 mg by mouth daily.     polyethylene glycol packet  Commonly known as:  MIRALAX / GLYCOLAX  Take 17 g by mouth every evening.     potassium chloride SA 20 MEQ tablet  Commonly known as:  K-DUR,KLOR-CON  Take 20 mEq by mouth daily.     pravastatin 20 MG tablet  Commonly known as:  PRAVACHOL  Take 20 mg by mouth daily.     senna 8.6 MG tablet  Commonly known as:  SENOKOT  Take 3 tablets by mouth at bedtime.     sucralfate 1 G tablet  Commonly known as:  CARAFATE  Take 1 g by mouth 2 (two) times daily.     SYNTHROID 175 MCG tablet  Generic drug:  levothyroxine  Take 175 mcg by mouth daily.     tamsulosin 0.4 MG Caps capsule  Commonly known as:  FLOMAX  Take 0.4 mg by mouth daily after supper.     TOPROL XL 50 MG 24 hr tablet  Generic drug:  metoprolol succinate  Take 50 mg by mouth daily.     warfarin 2.5 MG tablet  Commonly known as:  COUMADIN  Take 2.5-5 mg by mouth daily at 6 PM. Takes 2.5mg  (1 tablet) daily except  (2 tablets) on Monday and Friday       Outstanding Labs/Studies  INR f/u on 4/25 with PCP.  Duration of Discharge Encounter   Greater than 30 minutes including physician time.  Signed, Nicolasa Ducking NP 11/23/2014, 11:40 AM See progress note. For DC today. Lennette Bihari, MD  11/23/2014  12:01 PM

## 2014-11-23 NOTE — Anesthesia Postprocedure Evaluation (Signed)
  Anesthesia Post-op Note  Patient: [name] DoneFranklin D Baker  Procedure(s) Performed: Procedure(s): MULTIPLE EXTRACTION OF TOOTH #'S 8, 18, 30, AND 31 WITH ALVEOLOPLASTY AND GROSS DEBRIDEMENT OF REMAINING TEETH (N/A)  Patient Location: PACU  Anesthesia Type:MAC  Level of Consciousness: awake and alert   Airway and Oxygen Therapy: Patient Spontanous Breathing  Post-op Pain: none  Post-op Assessment: Post-op Vital signs reviewed  Post-op Vital Signs: Reviewed  Last Vitals:  Filed Vitals:   11/23/14 0545  BP: 102/65  Pulse: 94  Temp: 37.2 C  Resp: 18    Complications: No apparent anesthesia complications

## 2014-11-23 NOTE — Progress Notes (Signed)
Patient Name: [name] Graham Date of Encounter: 11/23/2014     Principal Problem:   Chronic periodontitis Active Problems:   Severe aortic stenosis   HYPERTENSION, BENIGN   Atrial fibrillation   Chronic diastolic congestive heart failure   HYPERCHOLESTEROLEMIA  IIA   Parkinson's disease    SUBJECTIVE  No chest pain, dyspnea, or significant mouth pain/bleeding.  Eager to go home.  CURRENT MEDS . acetaminophen  1,000 mg Oral QAC breakfast   And  . acetaminophen  500 mg Oral QHS  . allopurinol  300 mg Oral Daily  . carbidopa-levodopa  1 tablet Oral TID  . finasteride  5 mg Oral Daily  . furosemide  80 mg Oral Daily  . insulin aspart  0-15 Units Subcutaneous TID WC  . levothyroxine  175 mcg Oral QAC breakfast  . metoprolol succinate  50 mg Oral Daily  . pantoprazole  40 mg Oral Daily  . polyethylene glycol  17 g Oral QPM  . potassium chloride SA  20 mEq Oral Daily  . pravastatin  20 mg Oral Daily  . senna  3 tablet Oral QHS  . sucralfate  1 g Oral BID AC  . tamsulosin  0.4 mg Oral QPC supper  . Warfarin - Pharmacist Dosing Inpatient   Does not apply q1800    OBJECTIVE  Filed Vitals:   11/22/14 2140 11/23/14 0400 11/23/14 0428 11/23/14 0545  BP: 113/62 104/58 105/59 102/65  Pulse: 84 100 95 94  Temp: 98.2 F (36.8 C) 98.7 F (37.1 C)  99 F (37.2 C)  TempSrc: Oral Oral  Oral  Resp: 18   18  Height:      Weight:      SpO2: 90% 90% 92% 91%    Intake/Output Summary (Last 24 hours) at 11/23/14 1119 Last data filed at 11/23/14 1024  Gross per 24 hour  Intake   1890 ml  Output    825 ml  Net   1065 ml   Filed Weights   11/22/14 1514  Weight: 304 lb 6.4 oz (138.075 kg)    PHYSICAL EXAM  General: Pleasant, NAD. Neuro: Alert and oriented X 3. Moves all extremities spontaneously. Psych: Normal affect. HEENT:  Normal  Neck: Supple without bruits or JVD. Lungs:  Resp regular and unlabored, CTA. Heart: IR, IR no s3, s4. 3/6 SEM throughout. Abdomen:  Soft, non-tender, non-distended, BS + x 4.  Extremities: No clubbing, cyanosis.  1+ bilat LE ankle edema. DP/PT/Radials 1+ and equal bilaterally.  Accessory Clinical Findings  CBC  Recent Labs  11/21/14 1043 11/23/14 0712  WBC 4.8 6.5  HGB 12.0* 11.1*  HCT 36.2* 32.3*  MCV 104.0* 102.9*  PLT 141* 134*   Basic Metabolic Panel  Recent Labs  11/21/14 1043  NA 136  K 3.7  CL 102  CO2 27  GLUCOSE 127*  BUN 20  CREATININE 1.31  CALCIUM 9.0   Lab Results  Component Value Date   INR 1.14 11/23/2014   INR 1.17 11/21/2014   INR 3.13* 03/08/2014    TELE  Afib, rate controlled.  Radiology/Studies  No results found.  ASSESSMENT AND PLAN  1.  Chronic Afib: admitted following lovenox bridging for dental procedure.  Plan to resume coumadin without bridging on the back end.  Will need early INR f/u next week with PCP (scheduled for Monday).  Cont bb.  2.  Periodontal disease:  S/p teeth extractions yesterday.  No bleeding.  Has not required amicar.  F/U Dr. Kristin Bruins  in 7-10 days. Saline rinses in the interim.  3.  Severe AS:  Conservative therapy as he is neither a candidate for SAVR or TAVR 2/2 comorbidities and annular size.  4.  Chronic diastolic CHF:  HR/BP well controlled.  Euvolemic on exam.   Signed, Nicolasa Duckinghristopher Berge NP  Patient seen and examined. Agree with assessment and plan. No chest pain or dyspnea. No active bleeding from dental extraction.  DC today with coumadin extraction.   Lennette Biharihomas A. Hideko Esselman, MD, [facility] 11/23/2014 11:59 AM

## 2014-11-23 NOTE — Care Management Note (Signed)
    Page 1 of 1   11/23/2014     2:35:03 PM CARE MANAGEMENT NOTE 11/23/2014  Patient:  [name] Graham,[name] Graham   Account Number:  [id number]  Date Initiated:  11/23/2014  Documentation initiated by:  Lawerance SabalSWIST,DEBBIE  Subjective/Objective Assessment:   aortic stenosis, lives at home     Action/Plan:   will follow for discharge need   Anticipated DC Date:  11/23/2014   Anticipated DC Plan:  HOME W HOME HEALTH SERVICES      DC Planning Services  CM consult      Choice offered to / List presented to:             Status of service:  Completed, signed off Medicare Important Message given?   (If response is "NO", the following Medicare IM given date fields will be blank) Date Medicare IM given:   Medicare IM given by:   Date Additional Medicare IM given:   Additional Medicare IM given by:    Discharge Disposition:  HOME/SELF CARE  Per UR Regulation:  Reviewed for med. necessity/level of care/duration of stay  If discussed at Long Length of Stay Meetings, dates discussed:    Comments:  11-23-14 pt refused HH prior to discharge. Lawerance Sabalebbie Swist RN BSN CM

## 2014-11-23 NOTE — Evaluation (Signed)
Physical Therapy Evaluation Patient Details Name: [name] Graham MRN: [id number] DOB: 1928-11-17 Today's Date: 11/23/2014   History of Present Illness  Pt is a 81 y.o. male with a PMH significant for severe aortic stenosis, chronic diastolic heart failure, chronic atrial fibrillation, chronic anticoagulation admitted for elective multiple tooth extractions. Here for anticoagulation bridging. The patient is not a candidate for TAVR because of his large valve annulus. His annulus is to big for the largest manufacturedtranscatheter heart valve bioprosthesis. He is not a candidate for conventional surgical aortic valve replacement because of medical comorbidities. Plan is to continue with medical therapy.  Clinical Impression  Pt admitted with above diagnosis. Pt currently with functional limitations due to the deficits listed below (see PT Problem List). At the time of PT eval pt was able to perform transfers and ambulation with supervision to min guard assist for safety. Tolerance for functional activity is decreased from baseline, however pt can ambulate the distance needed to enter his home from the car. Recommendations for d/c include using the RW instead of the SPC, and HHPT to follow. It is likely that pt will refuse follow-up PT, however it would be beneficial. Pt will benefit from skilled PT to increase their independence and safety with mobility to allow discharge to the venue listed below.       Follow Up Recommendations Home health PT;Supervision/Assistance - 24 hour    Equipment Recommendations  None recommended by PT    Recommendations for Other Services       Precautions / Restrictions Precautions Precautions: Fall Restrictions Weight Bearing Restrictions: No      Mobility  Bed Mobility Overal bed mobility: Needs Assistance Bed Mobility: Supine to Sit     Supine to sit: Supervision     General bed mobility comments: Increased time to transition to EOB. Pt appears  unsteady initially but was able to recover independently.   Transfers Overall transfer level: Needs assistance Equipment used: Rolling walker (2 wheeled) Transfers: Sit to/from Stand Sit to Stand: Min guard         General transfer comment: Pt was able to power-up to full standing position without assistance. Close guard for safety. Pt was cued for hand placement on seated surface.  Ambulation/Gait Ambulation/Gait assistance: Min guard Ambulation Distance (Feet): 100 Feet Assistive device: Rolling walker (2 wheeled) Gait Pattern/deviations: Step-through pattern;Decreased stride length;Trendelenburg;Trunk flexed Gait velocity: Decreased Gait velocity interpretation: Below normal speed for age/gender General Gait Details: Close guard for safety with the RW. Recommended that pt continue to use the walker at home for the first few days, however pt hesitant to agree. Distance limited by fatigue.   Stairs            Wheelchair Mobility    Modified Rankin (Stroke Patients Only)       Balance Overall balance assessment: Needs assistance Sitting-balance support: Feet supported;No upper extremity supported Sitting balance-Leahy Scale: Fair     Standing balance support: Bilateral upper extremity supported;During functional activity Standing balance-Leahy Scale: Poor Standing balance comment: Pt requires UE support at this time.                              Pertinent Vitals/Pain Pain Assessment: No/denies pain    Home Living Family/patient expects to be discharged to:: Private residence Living Arrangements: Non-relatives/Friends Available Help at Discharge: Family;Friend(s);Available 24 hours/day Type of Home: Mobile home Home Access: Ramped entrance     Home Layout: One level  Home Equipment: Gilmer MorCane - single point      Prior Function Level of Independence: Independent with assistive device(s)         Comments: Pt reports that he uses his cane all the  time.     Hand Dominance        Extremity/Trunk Assessment   Upper Extremity Assessment: Defer to OT evaluation;Overall [facility] InstituteWFL for tasks assessed           Lower Extremity Assessment: Generalized weakness (4+/5 quads, 4-/5 hams and hip flexors)      Cervical / Trunk Assessment: Normal  Communication   Communication: No difficulties  Cognition Arousal/Alertness: Awake/alert Behavior During Therapy: WFL for tasks assessed/performed Overall Cognitive Status: Within Functional Limits for tasks assessed                      General Comments      Exercises        Assessment/Plan    PT Assessment Patient needs continued PT services  PT Diagnosis Difficulty walking;Generalized weakness   PT Problem List Decreased strength;Decreased range of motion;Decreased activity tolerance;Decreased balance;Decreased mobility;Decreased knowledge of use of DME;Decreased safety awareness;Decreased knowledge of precautions  PT Treatment Interventions DME instruction;Gait training;Stair training;Functional mobility training;Therapeutic activities;Therapeutic exercise;Neuromuscular re-education;Patient/family education   PT Goals (Current goals can be found in the Care Plan section) Acute Rehab PT Goals Patient Stated Goal: Walk with his cane again PT Goal Formulation: With patient Time For Goal Achievement: 11/30/14 Potential to Achieve Goals: Good    Frequency Min 3X/week   Barriers to discharge        Co-evaluation               End of Session Equipment Utilized During Treatment: Gait belt Activity Tolerance: Patient limited by fatigue Patient left: in chair;with call bell/phone within reach Nurse Communication: Mobility status    Functional Assessment Tool Used: Clinical judgement Functional Limitation: Mobility: Walking and moving around Mobility: Walking and Moving Around Current Status ([id number](G8978): At least 1 percent but less than 20 percent impaired, limited or  restricted Mobility: Walking and Moving Around Goal Status [phone](G8979): At least 1 percent but less than 20 percent impaired, limited or restricted    Time: 0929-0954 PT Time Calculation (min) (ACUTE ONLY): 25 min   Charges:   PT Evaluation $Initial PT Evaluation Tier I: 1 Procedure PT Treatments $Gait Training: 8-22 mins   PT G Codes:   PT G-Codes **NOT FOR INPATIENT CLASS** Functional Assessment Tool Used: Clinical judgement Functional Limitation: Mobility: Walking and moving around Mobility: Walking and Moving Around Current Status (U9811(G8978): At least 1 percent but less than 20 percent impaired, limited or restricted Mobility: Walking and Moving Around Goal Status [phone](G8979): At least 1 percent but less than 20 percent impaired, limited or restricted    Conni SlipperKirkman, Heydi Swango 11/23/2014, 1:27 PM  Conni SlipperLaura Kyley Laurel, PT, DPT Acute Rehabilitation Services Pager: [phone]

## 2014-12-04 ENCOUNTER — Encounter (HOSPITAL_COMMUNITY): Payer: Self-pay | Admitting: Dentistry

## 2014-12-04 ENCOUNTER — Ambulatory Visit (HOSPITAL_COMMUNITY): Payer: Self-pay | Admitting: Dentistry

## 2014-12-04 VITALS — BP 132/88 | HR 107 | Temp 97.7°F

## 2014-12-04 DIAGNOSIS — Z7901 Long term (current) use of anticoagulants: Secondary | ICD-10-CM

## 2014-12-04 DIAGNOSIS — I350 Nonrheumatic aortic (valve) stenosis: Secondary | ICD-10-CM

## 2014-12-04 DIAGNOSIS — K08409 Partial loss of teeth, unspecified cause, unspecified class: Secondary | ICD-10-CM

## 2014-12-04 NOTE — Progress Notes (Signed)
POST OPERATIVE NOTE:  12/04/2014   [name] Graham [id number]  VITALS: BP 128/82 mmHg  Pulse 102  Temp(Src) 98.2 F (36.8 C)  LABS:  Lab Results  Component Value Date   WBC 6.5 11/23/2014   HGB 11.1* 11/23/2014   HCT 32.3* 11/23/2014   MCV 102.9* 11/23/2014   PLT 134* 11/23/2014   BMET    Component Value Date/Time   NA 136 11/21/2014 1043   K 3.7 11/21/2014 1043   CL 102 11/21/2014 1043   CO2 27 11/21/2014 1043   GLUCOSE 127* 11/21/2014 1043   BUN 20 11/21/2014 1043   CREATININE 1.31 11/21/2014 1043   CALCIUM 9.0 11/21/2014 1043   GFRNONAA 49* 11/21/2014 1043   GFRAA 56* 11/21/2014 1043    Lab Results  Component Value Date   INR 1.14 11/23/2014   INR 1.17 11/21/2014   INR 3.13* 03/08/2014   No results found for: PTT   [name] Graham is status post extraction of tooth numbers 8, 18, 30, and 31 with alveoloplasty and gross debridement of remaining dentition on 11/22/14. The patient presents for evaluation of healing and suture removal as indicated.  SUBJECTIVE: The patient has minimal complaints of discomfort. Patient has had minimal bleeding after the dental procedures. Patient currently has an INR of 2.2 by report.  EXAM: There is no sign of infection, heme, or ooze. Sutures are all gone. The patient is healing in by generalized primary closure although he has several areas that we'll need to heal in by secondary intention. There is no evidence of lower left lingual delayed healing or exposed bone at this time.  PROCEDURE: The patient was given a chlorhexidine gluconate rinse for 30 seconds. No sutures were noted that needed removal.  ASSESSMENT: Post operative course is consistent with dental procedures performed in the OR.  PLAN: 1. Continue salt water rinses as needed to aid healing. 2. Brush after meals and at bedtime. 3. Follow-up with general dentist of his choice for exam, dental restorations, periodontal therapy, and discussion of replacement of  missing teeth as indicated. Ideally patient should heal in another month before replacement of teeth.     The patient was given the names of several dentists in the [place] area along with [facility]. We'll forward records after release of information is signed. 4. Call if problems arise with healing.  Charlynne Panderonald F. Vincent Ehrler, DDS

## 2014-12-04 NOTE — Patient Instructions (Addendum)
PLAN: 1. Continue salt water rinses as needed to aid healing. 2. Brush after meals and at bedtime. 3. Follow-up with general dentist of his choice for exam, dental restorations, periodontal therapy, and discussion of replacement of missing teeth as indicated. Ideally patient should heal in another month before replacement of teeth.     The patient was given the names of several dentists in the [place] area along with [facility]. We'll forward records after release of information is signed. 4. Call if problems arise with healing.  Charlynne Panderonald F. Kulinski, DDS

## 2014-12-11 ENCOUNTER — Telehealth: Payer: Self-pay | Admitting: Cardiovascular Disease

## 2014-12-11 NOTE — Telephone Encounter (Signed)
New Message     Pt's son calling stating that [facility] and they are taking him off some medications that Dr. Excell Seltzerooper said he could never come off of and he has sone concerns. The meds are Metoprolol and Lisinopril. Please call back and advise.

## 2014-12-11 NOTE — Telephone Encounter (Signed)
I spoke with [name] and Angie about the pt. The pt is currently hospitalized at [facility] with what sounds like CHF. The pt had stopped Metoprolol and Lisinopril on his own for 6 days due to fainting spells and passing out. Per Angie the pt is getting IV diuretics to pull fluid off and the physicians have continued not to give the pt his metoprolol or lisinopril.  I made her aware that if a pt's BP and pulse are to low or kidney function is increased then this would keep the pt from restarting these medications.  They would like to make Dr Excell Seltzerooper aware of this information.  I made her aware that they need to sign a ROI for the pt's records to be sent to our office upon discharge.

## 2014-12-14 ENCOUNTER — Telehealth: Payer: Self-pay | Admitting: Cardiovascular Disease

## 2014-12-14 NOTE — Telephone Encounter (Signed)
Tried to call son again. No VM

## 2014-12-14 NOTE — Telephone Encounter (Signed)
Tried to call son back but no VM set up to leave message

## 2014-12-14 NOTE — Telephone Encounter (Signed)
New Message   Pt son called to speak w/ Rn to see if current appt [facility]/ Scott on 6/10 is okay, or if pt needs to be seen sooner. [facility]. Please call back and discuss.

## 2014-12-17 NOTE — Telephone Encounter (Signed)
Lm on son-Jason's cell phone- [phone]

## 2014-12-17 NOTE — Telephone Encounter (Signed)
Tried calling son again today. No VM set up to leave message.

## 2015-01-02 DEATH — deceased

## 2015-01-11 ENCOUNTER — Encounter: Payer: Self-pay | Admitting: Physician Assistant
